# Patient Record
Sex: Male | Born: 1945
Health system: Southern US, Community
[De-identification: ages and names within clinical notes are randomized; demographics above are authoritative.]

## PROBLEM LIST (undated history)

## (undated) DIAGNOSIS — G4730 Sleep apnea, unspecified: Secondary | ICD-10-CM

## (undated) DIAGNOSIS — E785 Hyperlipidemia, unspecified: Secondary | ICD-10-CM

## (undated) DIAGNOSIS — E079 Disorder of thyroid, unspecified: Secondary | ICD-10-CM

## (undated) DIAGNOSIS — D689 Coagulation defect, unspecified: Secondary | ICD-10-CM

## (undated) DIAGNOSIS — N529 Male erectile dysfunction, unspecified: Secondary | ICD-10-CM

## (undated) DIAGNOSIS — J45909 Unspecified asthma, uncomplicated: Secondary | ICD-10-CM

## (undated) DIAGNOSIS — I10 Essential (primary) hypertension: Secondary | ICD-10-CM

## (undated) DIAGNOSIS — M1990 Unspecified osteoarthritis, unspecified site: Secondary | ICD-10-CM

## (undated) DIAGNOSIS — J329 Chronic sinusitis, unspecified: Secondary | ICD-10-CM

## (undated) DIAGNOSIS — J449 Chronic obstructive pulmonary disease, unspecified: Secondary | ICD-10-CM

## (undated) DIAGNOSIS — K219 Gastro-esophageal reflux disease without esophagitis: Secondary | ICD-10-CM

## (undated) DIAGNOSIS — K648 Other hemorrhoids: Secondary | ICD-10-CM

## (undated) DIAGNOSIS — D126 Benign neoplasm of colon, unspecified: Secondary | ICD-10-CM

## (undated) DIAGNOSIS — G4733 Obstructive sleep apnea (adult) (pediatric): Secondary | ICD-10-CM

## (undated) DIAGNOSIS — J309 Allergic rhinitis, unspecified: Secondary | ICD-10-CM

## (undated) DIAGNOSIS — C6290 Malignant neoplasm of unspecified testis, unspecified whether descended or undescended: Secondary | ICD-10-CM

## (undated) DIAGNOSIS — J9819 Other pulmonary collapse: Secondary | ICD-10-CM

## (undated) DIAGNOSIS — T7840XA Allergy, unspecified, initial encounter: Secondary | ICD-10-CM

## (undated) HISTORY — DX: Other pulmonary collapse: J98.19

## (undated) HISTORY — DX: Sleep apnea, unspecified: G47.30

## (undated) HISTORY — DX: Malignant neoplasm of unspecified testis, unspecified whether descended or undescended: C62.90

## (undated) HISTORY — DX: Chronic sinusitis, unspecified: J32.9

## (undated) HISTORY — DX: Allergy, unspecified, initial encounter: T78.40XA

## (undated) HISTORY — DX: Benign neoplasm of colon, unspecified: D12.6

## (undated) HISTORY — DX: Other hemorrhoids: K64.8

## (undated) HISTORY — DX: Essential (primary) hypertension: I10

## (undated) HISTORY — PX: TESTICLE REMOVAL: SHX68

## (undated) HISTORY — DX: Disorder of thyroid, unspecified: E07.9

## (undated) HISTORY — DX: Hyperlipidemia, unspecified: E78.5

## (undated) HISTORY — DX: Coagulation defect, unspecified: D68.9

## (undated) HISTORY — PX: NOSE SURGERY: SHX723

## (undated) HISTORY — DX: Chronic obstructive pulmonary disease, unspecified: J44.9

## (undated) HISTORY — DX: Unspecified osteoarthritis, unspecified site: M19.90

## (undated) HISTORY — PX: SINUS SURGERY WITH INSTATRAK: SHX5215

## (undated) HISTORY — DX: Unspecified asthma, uncomplicated: J45.909

## (undated) HISTORY — PX: INGUINAL HERNIA REPAIR: SUR1180

## (undated) HISTORY — DX: Obstructive sleep apnea (adult) (pediatric): G47.33

## (undated) HISTORY — PX: POLYPECTOMY: SHX149

## (undated) HISTORY — DX: Gastro-esophageal reflux disease without esophagitis: K21.9

## (undated) HISTORY — PX: COLONOSCOPY: SHX174

## (undated) HISTORY — DX: Allergic rhinitis, unspecified: J30.9

## (undated) HISTORY — DX: Male erectile dysfunction, unspecified: N52.9

---

## 2001-03-05 ENCOUNTER — Encounter (INDEPENDENT_AMBULATORY_CARE_PROVIDER_SITE_OTHER): Payer: Self-pay

## 2001-03-05 ENCOUNTER — Other Ambulatory Visit: Admission: RE | Admit: 2001-03-05 | Discharge: 2001-03-05 | Payer: Self-pay | Admitting: Internal Medicine

## 2001-03-05 ENCOUNTER — Encounter: Payer: Self-pay | Admitting: Internal Medicine

## 2002-06-18 ENCOUNTER — Encounter: Payer: Self-pay | Admitting: Internal Medicine

## 2002-08-06 ENCOUNTER — Encounter: Payer: Self-pay | Admitting: Internal Medicine

## 2002-12-18 ENCOUNTER — Ambulatory Visit (HOSPITAL_COMMUNITY): Admission: RE | Admit: 2002-12-18 | Discharge: 2002-12-19 | Payer: Self-pay | Admitting: Otolaryngology

## 2002-12-18 ENCOUNTER — Encounter: Payer: Self-pay | Admitting: Otolaryngology

## 2003-12-16 ENCOUNTER — Encounter: Admission: RE | Admit: 2003-12-16 | Discharge: 2003-12-16 | Payer: Self-pay | Admitting: Otolaryngology

## 2004-10-22 ENCOUNTER — Ambulatory Visit: Payer: Self-pay | Admitting: Internal Medicine

## 2004-11-05 ENCOUNTER — Ambulatory Visit: Payer: Self-pay | Admitting: Internal Medicine

## 2004-11-19 ENCOUNTER — Ambulatory Visit: Payer: Self-pay | Admitting: Surgery

## 2004-11-22 ENCOUNTER — Ambulatory Visit: Payer: Self-pay | Admitting: Otolaryngology

## 2005-04-26 ENCOUNTER — Other Ambulatory Visit: Payer: Self-pay

## 2005-04-26 ENCOUNTER — Emergency Department: Payer: Self-pay | Admitting: Internal Medicine

## 2005-06-22 ENCOUNTER — Ambulatory Visit: Payer: Self-pay | Admitting: Otolaryngology

## 2005-09-23 ENCOUNTER — Ambulatory Visit: Payer: Self-pay | Admitting: Internal Medicine

## 2005-09-27 ENCOUNTER — Encounter (INDEPENDENT_AMBULATORY_CARE_PROVIDER_SITE_OTHER): Payer: Self-pay | Admitting: *Deleted

## 2005-09-27 ENCOUNTER — Ambulatory Visit: Payer: Self-pay | Admitting: Internal Medicine

## 2007-03-09 ENCOUNTER — Ambulatory Visit: Payer: Self-pay | Admitting: Internal Medicine

## 2007-03-21 ENCOUNTER — Ambulatory Visit: Payer: Self-pay | Admitting: Internal Medicine

## 2007-03-21 ENCOUNTER — Encounter: Payer: Self-pay | Admitting: Internal Medicine

## 2007-10-20 ENCOUNTER — Other Ambulatory Visit: Payer: Self-pay

## 2007-10-20 ENCOUNTER — Emergency Department: Payer: Self-pay | Admitting: Unknown Physician Specialty

## 2007-11-01 HISTORY — PX: SUBCLAVIAN BYPASS GRAFT: SHX1059

## 2008-05-30 ENCOUNTER — Ambulatory Visit: Payer: Self-pay | Admitting: Chiropractic Medicine

## 2008-11-11 ENCOUNTER — Ambulatory Visit: Payer: Self-pay | Admitting: Family Medicine

## 2009-02-25 ENCOUNTER — Encounter (INDEPENDENT_AMBULATORY_CARE_PROVIDER_SITE_OTHER): Payer: Self-pay | Admitting: *Deleted

## 2009-03-17 ENCOUNTER — Ambulatory Visit: Payer: Self-pay | Admitting: Internal Medicine

## 2009-04-02 ENCOUNTER — Ambulatory Visit: Payer: Self-pay | Admitting: Internal Medicine

## 2009-04-02 ENCOUNTER — Encounter: Payer: Self-pay | Admitting: Internal Medicine

## 2009-04-06 ENCOUNTER — Encounter: Payer: Self-pay | Admitting: Internal Medicine

## 2009-04-09 ENCOUNTER — Encounter: Payer: Self-pay | Admitting: Internal Medicine

## 2009-04-23 ENCOUNTER — Telehealth (INDEPENDENT_AMBULATORY_CARE_PROVIDER_SITE_OTHER): Payer: Self-pay

## 2009-05-13 ENCOUNTER — Ambulatory Visit: Payer: Self-pay | Admitting: Genetic Counselor

## 2009-09-03 ENCOUNTER — Ambulatory Visit: Payer: Self-pay | Admitting: Genetic Counselor

## 2010-02-17 ENCOUNTER — Telehealth: Payer: Self-pay | Admitting: Internal Medicine

## 2010-04-02 ENCOUNTER — Encounter (INDEPENDENT_AMBULATORY_CARE_PROVIDER_SITE_OTHER): Payer: Self-pay | Admitting: *Deleted

## 2010-04-12 ENCOUNTER — Ambulatory Visit: Payer: Self-pay | Admitting: Genetic Counselor

## 2010-04-15 ENCOUNTER — Encounter: Payer: Self-pay | Admitting: Internal Medicine

## 2010-05-13 ENCOUNTER — Encounter (INDEPENDENT_AMBULATORY_CARE_PROVIDER_SITE_OTHER): Payer: Self-pay | Admitting: *Deleted

## 2010-05-15 ENCOUNTER — Encounter: Payer: Self-pay | Admitting: Internal Medicine

## 2010-05-17 ENCOUNTER — Ambulatory Visit: Payer: Self-pay | Admitting: Internal Medicine

## 2010-06-01 ENCOUNTER — Ambulatory Visit: Payer: Self-pay | Admitting: Internal Medicine

## 2010-06-02 ENCOUNTER — Encounter: Payer: Self-pay | Admitting: Internal Medicine

## 2010-11-30 NOTE — Letter (Signed)
Summary: Patient Notice- Polyp Results  [facility]  [address] [place], [place] [address]   Phone: [phone]  Fax: [phone]        June 02, 2010 MRN: [id number]    Matthew [name] [address] [place], [place]  [address]    Dear [name],  I am pleased to inform you that the colon polyp(s) removed during your recent colonoscopy was (were) found to be benign (no cancer detected) upon pathologic examination.  I recommend you have a repeat colonoscopy examination in one year to look for recurrent polyps, as having colon polyps increases your risk for having recurrent polyps or even colon cancer in the future.  Should you develop new or worsening symptoms of abdominal pain, bowel habit changes or bleeding from the rectum or bowels, please schedule an evaluation with either your primary care physician or with me.  Additional information/recommendations:  __ No further action with gastroenterology is needed at this time. Please      follow-up with your primary care physician for your other healthcare      needs.  __ Please consider rescheduling an appointment with the genetic counselor at the cancer center.   Please call us if you are having persistent problems or have questions about your condition that have not been fully answered at this time.  Sincerely,  Hilarie Fredrickson MD  This letter has been electronically signed by your physician.  Appended Document: Patient Notice- Polyp Results letter mailed

## 2010-11-30 NOTE — Procedures (Signed)
Summary: Colonoscopy  Patient: Matthew [name] Note: All result statuses are Final unless otherwise noted.  Tests: (1) Colonoscopy (COL)   COL Colonoscopy           DONE     [facility]     [address]     [place], [place]  [address]           COLONOSCOPY PROCEDURE REPORT           PATIENT:  Matthew [name], Matthew [name]  MR#:  [id number]     BIRTHDATE:  1945/11/11, 64 yrs. old  GENDER:  male     ENDOSCOPIST:  Wilhemina Bonito. Eda Keys, MD     REF. BY:  Surveillance Program Recall     PROCEDURE DATE:  06/01/2010     PROCEDURE:  Colonoscopy with snare polypectomy x 3     ASA CLASS:  Class II     INDICATIONS:  history of pre-cancerous (adenomatous) colon polyps,     surveillance and high-risk screening, family history of colon     cancer ;2002 w/ TVA;2003; 10-2004 w/ malignant     polyp;11-06;2008;2010 w/ 9 small polyps; Also, sister w [name]     Cancer     MEDICATIONS:   Fentanyl 75 mcg IV, Versed 9 mg IV           DESCRIPTION OF PROCEDURE:   After the risks benefits and     alternatives of the procedure were thoroughly explained, informed     consent was obtained.  Digital rectal exam was performed and     revealed no abnormalities.   The LB CF-H180AL [id number] endoscope     was introduced through the anus and advanced to the cecum, which     was identified by both the appendix and ileocecal valve, without     limitations.Time to cecum = 1:35 min.  The quality of the prep was     excellent, using MoviPrep.  The instrument was then slowly     withdrawn (time = 14:49min) as the colon was fully examined.     <<PROCEDUREIMAGES>>           FINDINGS:  Three polyps , all <25mm, were found in the ascending     colon (2) and transverse colon. Polyps were snared without     cautery. Retrieval was successful in 2/3.  Moderate diverticulosis     was found found scattered throught the colon.  This was otherwise     a normal examination of the colon.   Retroflexed views in the     rectum revealed  internal hemorrhoids.    The scope was then     withdrawn from the patient and the procedure completed.           COMPLICATIONS:  None     ENDOSCOPIC IMPRESSION:     1) Three polyps - removed     2) Moderate diverticulosis found scattered throught the colon     3) Otherwise normal examination     4) Internal hemorrhoids           RECOMMENDATIONS:     1) Follow up colonoscopy in one year     2) Please reconsider evaluation with Database administrator at     [facility] center           ______________________________     Wilhemina Bonito. Eda Keys, MD           CC:  Julieanne Manson, MD; The Patient  n.     eSIGNED:   Wilhemina Bonito. Eda Keys at 06/01/2010 11:05 AM           Mont Dutton, [id number]  Note: An exclamation mark (!) indicates a result that was not dispersed into the flowsheet. Document Creation Date: 06/01/2010 11:06 AM _______________________________________________________________________  (1) Order result status: Final Collection or observation date-time: 06/01/2010 10:55 Requested date-time:  Receipt date-time:  Reported date-time:  Referring Physician:   Ordering Physician: Fransico Setters [id number]) Specimen Source:  Source: Launa Grill Order Number: [id number] Lab site:   Appended Document: Colonoscopy     Procedures Next Due Date:    Colonoscopy: 06/2011

## 2010-11-30 NOTE — Progress Notes (Signed)
Summary: Referral to Genetics   Phone Note Outgoing Call   Call placed by: Milford Cage NCMA,  February 17, 2010 10:32 AM Call placed to: Patient Summary of Call: Called patient to follow-up on his appt. with [facility].  He was suppose to schedule in September of 2010 and didnt.  He states that he is traveling out of country alot and it is difficult for him to make the appt.  He states he will do so as soon as he can.   Initial call taken by: Milford Cage NCMA,  February 17, 2010 10:33 AM

## 2010-11-30 NOTE — Letter (Signed)
Summary: Colonoscopy Letter  [facility]  [address] [place], [place] [address]   Phone: [phone]  Fax: [phone]      April 02, 2010 MRN: [id number]   Matthew [name] [address] [place], [place]  [address]   Dear [name],   According to your medical record, it is time for you to schedule a Colonoscopy. The American Cancer Society recommends this procedure as a method to detect early colon cancer. Patients with a family history of colon cancer, or a personal history of colon polyps or inflammatory bowel disease are at increased risk.  This letter has been generated based on the recommendations made at the time of your procedure. If you feel that in your particular situation this may no longer apply, please contact our office.  Please call our office at [phone] to schedule this appointment or to update your records at your earliest convenience.  Thank you for cooperating with [place] to provide you with the very best care possible.   Sincerely,  Wilhemina Bonito. Marina Goodell, M.D.  [facility] [phone]

## 2010-11-30 NOTE — Letter (Signed)
[facility]   Imported By: Sherian Rein 04/22/2010 08:59:18  _____________________________________________________________________  External Attachment:    Type:   Image     Comment:   External Document

## 2010-11-30 NOTE — Letter (Signed)
Summary: Recall Colonoscopy Letter  [facility]  [address] [place], [place] [address]   Phone: [phone]  Fax: [phone]      February 25, 2009 MRN: [id number]   Matthew [name] [address] [place], [place]  [address]   Dear [name],   According to your medical record, it is time for you to schedule a Colonoscopy. The American Cancer Society recommends this procedure as a method to detect early colon cancer. Patients with a family history of colon cancer, or a personal history of colon polyps or inflammatory bowel disease are at increased risk.  This letter has beeen generated based on the recommendations made at the time of your procedure. If you feel that in your particular situation this may no longer apply, please contact our office.  Please call our office at [phone] to schedule this appointment or to update your records at your earliest convenience.  Thank you for cooperating with [place] to provide you with the very best care possible.   Sincerely,  Wilhemina Bonito. Marina Goodell, M.D.  [facility] [phone]

## 2010-11-30 NOTE — Miscellaneous (Signed)
Summary: [facility] previsit  Clinical Lists Changes  Medications: Added new medication of MOVIPREP 100 GM  SOLR (PEG-KCL-NACL-NASULF-NA ASC-C) As per prep instructions. - Signed Rx of MOVIPREP 100 GM  SOLR (PEG-KCL-NACL-NASULF-NA ASC-C) As per prep instructions.;  #1 x 0;  Signed;  Entered by: [name];  Authorized by: Hilarie Fredrickson MD;  Method used: [id number] [address], [place], [place]  [address], Ph: [phone], Fax: [phone] Observations: Added new observation of NKA: T (05/17/2010 7:48)    Prescriptions: MOVIPREP 100 GM  SOLR (PEG-KCL-NACL-NASULF-NA ASC-C) As per prep instructions.  #1 x 0   Entered by:   [name]   Authorized by:   Hilarie Fredrickson MD   Signed by:   [name] on 05/17/2010   Method used:   [facility]ically to        [id number]       [address]       [place], [place]  [address]       Ph: [phone]       Fax: [phone]   RxID:   [id number]

## 2010-11-30 NOTE — Letter (Signed)
Summary: [facility] Instructions  [facility]  [address] [place], [place] [address]   Phone: [phone]  Fax: [phone]       Matthew [name]    May 15, 1946    MRN: [id number]        Procedure Day /Date:  06/01/10  Tuesday     Arrival Time:  8:30am     Procedure Time: 9:30am     Location of Procedure:                    _x _  [facility] (4th Floor)                        PREPARATION FOR COLONOSCOPY WITH MOVIPREP   Starting 5 days prior to your procedure _ 7/28/11_ do not eat nuts, seeds, popcorn, corn, beans, peas,  salads, or any raw vegetables.  Do not take any fiber supplements (e.g. Metamucil, Citrucel, and Benefiber).  THE DAY BEFORE YOUR PROCEDURE         DATE:    06/06/10  DAY:   Monday  1.  Drink clear liquids the entire day-NO SOLID FOOD  2.  Do not drink anything colored red or purple.  Avoid juices with pulp.  No orange juice.  3.  Drink at least 64 oz. (8 glasses) of fluid/clear liquids during the day to prevent dehydration and help the prep work efficiently.  CLEAR LIQUIDS INCLUDE: Water Jello Ice Popsicles Tea (sugar ok, no milk/cream) Powdered fruit flavored drinks Coffee (sugar ok, no milk/cream) Gatorade Juice: apple, white grape, white cranberry  Lemonade Clear bullion, consomm, broth Carbonated beverages (any kind) Strained chicken noodle soup Hard Candy                             4.  In the morning, mix first dose of MoviPrep solution:    Empty 1 Pouch A and 1 Pouch B into the disposable container    Add lukewarm drinking water to the top line of the container. Mix to dissolve    Refrigerate (mixed solution should be used within 24 hrs)  5.  Begin drinking the prep at 5:00 p.m. The MoviPrep container is divided by 4 marks.   Every 15 minutes drink the solution down to the next mark (approximately 8 oz) until the full liter is complete.   6.  Follow completed prep with 16 oz of clear liquid of your choice  (Nothing red or purple).  Continue to drink clear liquids until bedtime.  7.  Before going to bed, mix second dose of MoviPrep solution:    Empty 1 Pouch A and 1 Pouch B into the disposable container    Add lukewarm drinking water to the top line of the container. Mix to dissolve    Refrigerate  THE DAY OF YOUR PROCEDURE      DATE:   06/01/10 DAY:  Tuesday  Beginning at 4:30 a.m. (5 hours before procedure):         1. Every 15 minutes, drink the solution down to the next mark (approx 8 oz) until the full liter is complete.  2. Follow completed prep with 16 oz. of clear liquid of your choice.    3. You may drink clear liquids until  7:30am  (2 HOURS BEFORE PROCEDURE).   MEDICATION INSTRUCTIONS  Unless otherwise instructed, you should take regular prescription medications with a small sip  of water   as early as possible the morning of your procedure.         OTHER INSTRUCTIONS  You will need a responsible adult at least 65 years of age to accompany you and drive you home.   This person must remain in the waiting room during your procedure.  Wear loose fitting clothing that is easily removed.  Leave jewelry and other valuables at home.  However, you may wish to bring a book to read or  an iPod/MP3 player to listen to music as you wait for your procedure to start.  Remove all body piercing jewelry and leave at home.  Total time from sign-in until discharge is approximately 2-3 hours.  You should go home directly after your procedure and rest.  You can resume normal activities the  day after your procedure.  The day of your procedure you should not:   Drive   Make legal decisions   Operate machinery   Drink alcohol   Return to work  You will receive specific instructions about eating, activities and medications before you leave.    The above instructions have been reviewed and explained to me by  [name]  May 17, 2010 8:14 AM    I fully  understand and can verbalize these instructions _____________________________ Date _________

## 2010-11-30 NOTE — Miscellaneous (Signed)
Summary: [facility] PV  Clinical Lists Changes  Medications: Added new medication of MOVIPREP 100 GM  SOLR (PEG-KCL-NACL-NASULF-NA ASC-C) As per prep instructions. - Signed Rx of MOVIPREP 100 GM  SOLR (PEG-KCL-NACL-NASULF-NA ASC-C) As per prep instructions.;  #1 x 0;  Signed;  Entered by: Ezra Sites RN;  Authorized by: Hilarie Fredrickson MD;  Method used: [id number] [address], [place], [place]  [address], Ph: [phone], Fax: [phone] Observations: Added new observation of NKA: T (03/17/2009 9:17)    Prescriptions: MOVIPREP 100 GM  SOLR (PEG-KCL-NACL-NASULF-NA ASC-C) As per prep instructions.  #1 x 0   Entered by:   Ezra Sites RN   Authorized by:   Hilarie Fredrickson MD   Signed by:   Ezra Sites RN on 03/17/2009   Method used:   Electronically to        [id number]       [address]       [place], [place]  [address]       Ph: [phone]       Fax: [phone]   RxID:   [id number]

## 2010-11-30 NOTE — Letter (Signed)
Summary: [facility]  [facility]   Imported By: Sherian Rein 06/04/2010 10:50:37  _____________________________________________________________________  External Attachment:    Type:   Image     Comment:   External Document

## 2010-11-30 NOTE — Procedures (Signed)
Summary: Colonoscopy   Colonoscopy  Procedure date:  04/02/2009  Findings:      Location:  [facility].    Procedures Next Due Date:    Colonoscopy: 03/2010  COLONOSCOPY PROCEDURE REPORT  PATIENT:  Matthew [name], Matthew [name]  MR#:  [id number] BIRTHDATE:   Jan 23, 1946, 63 yrs. old   GENDER:   male  ENDOSCOPIST:   Matthew Bonito. Eda Keys, MD Referred by: Matthew [name],  PROCEDURE DATE:  04/02/2009 PROCEDURE:  Colonoscopy with snare polypectomy ASA CLASS:   Class II INDICATIONS: history of adenomatous colon polyps (2002 - TVadenoma; 2003, 10-2004 w/ malignant polyp; 08-2005; 2008)  MEDICATIONS:    Fentanyl 50 mcg IV, Versed 8 mg IV  DESCRIPTION OF PROCEDURE:   After the risks benefits and alternatives of the procedure were thoroughly explained, informed consent was obtained.  Digital rectal exam was performed and revealed no abnormalities.   The LB CF-H180AL [id number] endoscope was introduced through the anus and advanced to the cecum, which was identified by both the appendix and ileocecal valve, without limitations. TIME TO CECUM = 1:21 MIN The quality of the prep was excellent, using MoviPrep.  The instrument was then withdrawn (TIME = 22:20 MIN) as the colon was fully examined. <<PROCEDUREIMAGES>>                    <<OLD IMAGES>>  FINDINGS:  There were multiple polyps (9) identified and removed. Cecal 3mm, 5mm; ascending 2,3,4,4,91mm; prox. transverse 65mm,5mm. Polyps were snared without cautery. Retrieval of 7 was successful.  Mild diverticulosis was found in the left colon.   Retroflexed views in the rectum revealed no abnormalities.    The scope was then withdrawn from the patient and the procedure completed.  COMPLICATIONS:   None  ENDOSCOPIC IMPRESSION:  1) Polyps, multiple - 9SEEN AND REMOVED  2) Mild diverticulosis in the left colon 3) ? Attenuated FAP or MAP  RECOMMENDATIONS:  1) Follow up colonoscopy in 1 year     _______________________________ Matthew Bonito. Eda Keys, MD   CC: Matthew Manson, MD; The Patient      REPORT OF SURGICAL PATHOLOGY   Case #: [id number] Patient Name: Matthew [name], Matthew [name] Office Chart Number:  [id number]   MRN: [id number] Pathologist: Matthew Booty B. Colonel Bald, MD DOB/Age  [birth year] (Age: 42)    Gender: M Date Taken:  04/02/2009 Date Received: 04/03/2009   FINAL DIAGNOSIS   ***MICROSCOPIC EXAMINATION AND DIAGNOSIS***   COLON, CECUM, ASCENDING, AND TRANSVERSE, POLYPS:  - MULTIPLE FRAGMENTS OF TUBULAR ADENOMA(S).  - MULTIPLE FRAGMENTS OF HYPERPLASTIC POLYP(S).  - HIGH GRADE DYSPLASIA IS NOT IDENTIFIED.    mj Date Reported:  04/06/2009     Matthew Booty B. Colonel Bald, MD *** Electronically Signed Out By [place] ***    April 06, 2009 MRN: [id number]    Matthew [name] [address] [place], [place]  [address]    Dear [name],  I am pleased to inform you that the colon polyp(s) removed during your recent colonoscopy was (were) found to be benign (no cancer detected) upon pathologic examination.  I recommend you have a repeat colonoscopy examination in ONE years to look for recurrent polyps, as having colon polyps increases your risk for having recurrent polyps or even colon cancer in the future.  Should you develop new or worsening symptoms of abdominal pain, bowel habit changes or bleeding from the rectum or bowels, please schedule an evaluation with either your primary care physician or with me.  Additional information/recommendations:  __ No further action with  gastroenterology is needed at this time. Please      follow-up with your primary care physician for your other healthcare      needs.  __ Please call [phone] to schedule an appointment with the genetic specialist at the cancer center (if not already done).    Please call us if you are having persistent problems or have questions about your condition that have not been fully answered at this time.  Sincerely,  Matthew Fredrickson MD  This letter has been electronically signed  by your physician.   Signed by Matthew Fredrickson MD on 04/06/2009 at 3:06 PM   This report was created from the original endoscopy report, which was reviewed and signed by the above listed endoscopist.

## 2010-11-30 NOTE — Miscellaneous (Signed)
Summary: Referral to Genetic Clinic  Clinical Lists Changes  Orders: Added new Test order of Genetic Counselor Annia Friendly ([facility]) - Signed

## 2010-11-30 NOTE — Letter (Signed)
Summary: Patient Notice- Polyp Results  [facility]  [address] [place], [place] [address]   Phone: [phone]  Fax: [phone]        April 06, 2009 MRN: [id number]    NELSON NOONE [address] [place], [place]  [address]    Dear Mr. Pavlov,  I am pleased to inform you that the colon polyp(s) removed during your recent colonoscopy was (were) found to be benign (no cancer detected) upon pathologic examination.  I recommend you have a repeat colonoscopy examination in ONE years to look for recurrent polyps, as having colon polyps increases your risk for having recurrent polyps or even colon cancer in the future.  Should you develop new or worsening symptoms of abdominal pain, bowel habit changes or bleeding from the rectum or bowels, please schedule an evaluation with either your primary care physician or with me.  Additional information/recommendations:  __ No further action with gastroenterology is needed at this time. Please      follow-up with your primary care physician for your other healthcare      needs.  __ Please call [phone] to schedule an appointment with the genetic specialist at the cancer center (if not already done).    Please call us if you are having persistent problems or have questions about your condition that have not been fully answered at this time.  Sincerely,  Hilarie Fredrickson MD  This letter has been electronically signed by your physician.

## 2010-11-30 NOTE — Progress Notes (Signed)
Summary: Appt with Genetic Clinic   Phone Note Outgoing Call   Call placed by: Ulis Rias RN Call placed to: Specialist Summary of Call: [facility] to check on appt date and time with Dr Annia Friendly. Left message to call office. Initial call taken by: Ulis Rias RN,  April 23, 2009 3:37 PM      Appended Document: Appt with Genetic Clinic Called Genectic counsel at [phone] and spoke with Bradly Chris to check on the status of appt for pt with Dr Annia Friendly. Per Bradly Chris, she does have the faxed records on pt and will try to schedule an appt as soon as she can. She will call us back with the appt later.  Appended Document: Appt with Genetic Clinic Called Genetic Clinic at [facility] and left message for Renee to call me back with update on appt. date and time.  Will attempt to call back if I haven't heard anything by end of day.  BS  Appended Document: Appt with Genetic Clinic renee called and states patient is out of state for 30 days and will schedule appt. in September.    Appended Document: Appt with Genetic Clinic called patient  regarding Genetics Referral.  Left message on VM asking if he is planning on making that appt. with [facility].  I asked him to call me and let me know.

## 2010-12-27 ENCOUNTER — Ambulatory Visit: Payer: Self-pay | Admitting: Internal Medicine

## 2010-12-28 NOTE — Procedures (Signed)
Summary: colonoscopy   Colonoscopy  Procedure date:  03/05/2001  Findings:      Location:  [facility].  Results: Polyp.  tubular adenoma  Patient Name: Matthew [name], Matthew [name] MRN:  Procedure Procedures: Colonoscopy CPT: 19147.    with polypectomy. CPT: A3573898.  Personnel: Endoscopist: Wilhemina Bonito. Marina Goodell, MD.  Referred By: Julieanne Manson, MD.  Exam Location: Exam performed in Outpatient Clinic. Outpatient  Patient Consent: Procedure, Alternatives, Risks and Benefits discussed, consent obtained, from patient.  Indications  Evaluation of: Polyps seen on recent Flexible Sigmoidoscopy.  History  Pre-Exam Physical: Performed Mar 05, 2001. Cardio-pulmonary exam, Rectal exam, HEENT exam , Abdominal exam, Extremity exam, Neurological exam, Mental status exam WNL.  Exam Exam: Extent of exam reached: Cecum, extent intended: Cecum.  The cecum was identified by appendiceal orifice and IC valve. Patient position: on left side. Colon retroflexion performed. Images taken. ASA Classification: I. Tolerance: excellent.  Monitoring: Pulse and BP monitoring, Oximetry used. Supplemental O2 given.  Colon Prep Used Golytely for colon prep. Prep results: excellent.  Sedation Meds: Versed 7 mg. Fentanyl 100 mcg.  Findings POLYP: Transverse Colon, Maximum size: 10 mm. pedunculated polyp. Procedure:  snare with cautery, removed, retrieved, Polyp sent to pathology.  MULTIPLE POLYPS: Transverse Colon. minimum size 3 mm, maximum size 4 mm. Procedure:  snare with cautery, removed, retrieved, 2 polyps Polyps sent to pathology.  MULTIPLE POLYPS: Cecum. minimum size 2 mm, maximum size 6 mm. Procedure:  snare with cautery, removed, Polyp retrieved, 3 polyps Polyps sent to pathology. ICD9: Colon Polyps: 211.3.  - DIVERTICULOSIS: Descending Colon to Sigmoid Colon. ICD9: Diverticulosis, Colon: 562.10.  POLYP: Sigmoid Colon, Maximum size: 16 mm. pedunculated polyp. Distance from Anus 32 cm.  Procedure:  snare with cautery, removed, retrieved, Polyp sent to pathology.  POLYP: Sigmoid Colon, Maximum size: 15 mm. pedunculated polyp. Distance from Anus 30 cm. Procedure:  snare with cautery, removed, retrieved, sent to pathology.  HEMORRHOIDS: Internal. ICD9: Hemorrhoids, Internal: 455.0.   Assessment Abnormal examination, see findings above.  Diagnoses: 211.3: Colon Polyps.  562.10: Diverticulosis, Colon.  455.0: Hemorrhoids, Internal.   Events  Unplanned Interventions: No intervention was required.  Unplanned Events: There were no complications. Plans  Post Exam Instructions: No aspirin or non-steroidal containing medications: 2 weeks.  Patient Education: Patient given standard instructions for: Polyps.  Disposition: After procedure patient sent to recovery. After recovery patient sent home.  Scheduling/Referral: Colonoscopy, to Wilhemina Bonito. Marina Goodell, MD, in 1 year given the numberof polyps.,    This report was created from the original endoscopy report, which was reviewed and signed by the above listed endoscopist.   cc:  Julieanne Manson, MD

## 2010-12-28 NOTE — Consult Note (Signed)
Summary: [facility]   Imported By: Sherian Rein 12/24/2010 06:51:14  _____________________________________________________________________  External Attachment:    Type:   Image     Comment:   External Document

## 2010-12-28 NOTE — Procedures (Signed)
Summary: Colonoscopy   Colonoscopy  Procedure date:  11/05/2004  Findings:      Results: Diverticulosis.         Location:  [facility].  Pathology:  Adenomatous polyp.        Adenocarcinoma  Comments:      Repeat colonoscopy in 2 years.    Procedures Next Due Date:    Colonoscopy: 10/2006  Colonoscopy  Procedure date:  11/05/2004  Findings:      Results: Diverticulosis.         Location:  [facility].  Pathology:  Adenomatous polyp.        Adenocarcinoma  Comments:      Repeat colonoscopy in 2 years.    Procedures Next Due Date:    Colonoscopy: 10/2006 Patient Name: Matthew [name], Matthew [name] MRN:  Procedure Procedures: Colonoscopy CPT: 45409.    with polypectomy. CPT: A3573898.  Personnel: Endoscopist: Wilhemina Bonito. Marina Goodell, MD.  Exam Location: Exam performed in Outpatient Clinic. Outpatient  Patient Consent: Procedure, Alternatives, Risks and Benefits discussed, consent obtained, from patient. Consent was obtained by the RN.  Indications  Surveillance of: Adenomatous Polyp(s). This is not an initial surveillance exam. Initial polypectomy was performed in 2002. in May. 3 or more Polyps were found at Index Exam. Largest polyp removed was 10 to 19 mm. Prior polyp located in both proximal and distal colon. Pathology of worst  polyp: tubulovillous adenoma. Previous surveillance exam(s) in  2003,  History  Current Medications: Patient is not currently taking Coumadin.  Pre-Exam Physical: Performed Nov 05, 2004. Entire physical exam was normal.  Exam Exam: Extent of exam reached: Cecum, extent intended: Cecum.  The cecum was identified by appendiceal orifice and IC valve. Patient position: on left side. Colon retroflexion performed. Images taken. ASA Classification: II. Tolerance: excellent.  Monitoring: Pulse and BP monitoring, Oximetry used. Supplemental O2 given.  Colon Prep Used MIRALAX for colon prep. Prep results: excellent.  Sedation  Meds: Patient assessed and found to be appropriate for moderate (conscious) sedation. Fentanyl 75 mcg. given IV. Versed 8 mg. given IV.  Findings NORMAL EXAM: Cecum to Rectum.  MULTIPLE POLYPS: Ascending Colon to Hepatic Flexure. minimum size 4 mm, maximum size 12 mm. Procedure:  snare with cautery, removed, Polyp retrieved, 4 polyps Polyps sent to pathology. ICD9: Colon Polyps: 211.3. Comments: 4mm & 5mm (cold snare) 10mm & 12mm (cautery) .  - DIVERTICULOSIS: Sigmoid Colon. ICD9: Diverticulosis, Colon: 562.10.   Assessment Abnormal examination, see findings above.  Diagnoses: 211.3: Colon Polyps.  562.10: Diverticulosis, Colon.   Events  Unplanned Interventions: No intervention was required.  Unplanned Events: There were no complications. Plans  Post Exam Instructions: No aspirin or non-steroidal containing medications: 2 weeks.  Disposition: After procedure patient sent to recovery. After recovery patient sent home.  Scheduling/Referral: Colonoscopy, to Wilhemina Bonito. Marina Goodell, MD, in 2 years ,    This report was created from the original endoscopy report, which was reviewed and signed by the above listed endoscopist.   cc:  Julieanne Manson, MD      The Patient

## 2010-12-28 NOTE — Procedures (Signed)
Summary: Colonoscopy   Colonoscopy  Procedure date:  09/27/2005  Findings:      Location:  [facility].  Results: Diverticulosis.       Results: Polyp.    Procedures Next Due Date:    Colonoscopy: 09/2006  Colonoscopy  Procedure date:  09/27/2005  Findings:      [facility]  Results: Diverticulosis.       Results: Polyp.    Procedures Next Due Date:    Colonoscopy: 09/2006 Patient Name: Dona, Klemann MRN:  Procedure Procedures: Colonoscopy CPT: 16109.    with polypectomy. CPT: A3573898.  Personnel: Endoscopist: Wilhemina Bonito. Marina Goodell, MD.  Exam Location: Exam performed in Outpatient Clinic. Outpatient  Patient Consent: Procedure, Alternatives, Risks and Benefits discussed, consent obtained, from patient. Consent was obtained by the RN.  Indications  Surveillance of: Adenomatous Polyp(s). This is not an initial surveillance exam. Initial polypectomy was performed in 2002. in Mar. 3 or more Polyps were found at Index Exam. Largest polyp removed was 10 to 19 mm. Prior polyp located in both proximal and distal colon. Pathology of worst  polyp: tubulovillous adenoma. Previous surveillance exam(s) in  2003, Previous surveillance exam(s) in  2006,  Comments: NOTE: Small polyp with malignant focus resected on 10-2004 exam History  Current Medications: Patient is not currently taking Coumadin.  Comments: P.VASC. SURGERY SINCE LAST EXAM Pre-Exam Physical: Performed Sep 27, 2005. Cardio-pulmonary exam, Rectal exam, Abdominal exam, Mental status exam WNL.  Exam Exam: Extent of exam reached: Cecum, extent intended: Cecum.  The cecum was identified by appendiceal orifice and IC valve. Patient position: on left side. Colon retroflexion performed. Images taken. ASA Classification: II. Tolerance: excellent.  Monitoring: Pulse and BP monitoring, Oximetry used. Supplemental O2 given.  Colon Prep Used MIRALAX for colon prep. Prep results:  excellent.  Sedation Meds: Patient assessed and found to be appropriate for moderate (conscious) sedation. Fentanyl 50 mcg. given IV. Versed 9 mg. given IV.  Findings POLYP: Hepatic Flexure, Maximum size: 3 mm. sessile polyp. Procedure:  snare with cautery, The polyp was removed piece meal. removed, retrieved, Polyp sent to pathology. ICD9: Colon Polyps: 211.3.  NORMAL EXAM: Cecum to Rectum. Comments: NO evidence of residual malignant polyp.  - DIVERTICULOSIS: Sigmoid Colon. ICD9: Diverticulosis, Colon: 562.10.   Assessment Abnormal examination, see findings above.  Diagnoses: 211.3: Colon Polyps.  562.10: Diverticulosis, Colon.   Events  Unplanned Interventions: No intervention was required.  Unplanned Events: There were no complications. Plans Disposition: After procedure patient sent to recovery. After recovery patient sent home.  Scheduling/Referral: Colonoscopy, to Wilhemina Bonito. Marina Goodell, MD, IN 1 YEAR,    This report was created from the original endoscopy report, which was reviewed and signed by the above listed endoscopist.   cc:  Julieanne Manson, MD      The Patient

## 2010-12-28 NOTE — Procedures (Signed)
Summary: colonoscopy   Colonoscopy  Procedure date:  08/06/2002  Findings:      Location:  [facility].  Results: Hemorrhoids.     Results: Diverticulosis.       Pathology:  Adenomatous polyp.          Procedures Next Due Date:    Colonoscopy: 07/2004 Patient Name: Matthew [name], Gagen MRN:  Procedure Procedures: Colonoscopy CPT: 16109.    with polypectomy. CPT: A3573898.  Personnel: Endoscopist: Wilhemina Bonito. Marina Goodell, MD.  Exam Location: Exam performed in Outpatient Clinic. Outpatient  Patient Consent: Procedure, Alternatives, Risks and Benefits discussed, consent obtained, from patient. Consent was obtained by the RN.  Indications  Surveillance of: Adenomatous Polyp(s). This is an initial surveillance exam. Initial polypectomy was performed in 2002. in May. 3 or more Polyps were found at Index Exam. Largest polyp removed was 10 to 19 mm. Prior polyp located in both proximal and distal colon. Pathology of worst  polyp: tubulovillous adenoma.  History  Pre-Exam Physical: Performed Aug 06, 2002. Entire physical exam was normal.  Exam Exam: Extent of exam reached: Cecum, extent intended: Cecum.  The cecum was identified by appendiceal orifice and IC valve. Patient position: on left side. Colon retroflexion performed. Images taken. ASA Classification: I. Tolerance: excellent.  Monitoring: Pulse and BP monitoring, Oximetry used. Supplemental O2 given.  Colon Prep Used Golytely for colon prep. Prep results: excellent.  Sedation Meds: Patient assessed and found to be appropriate for moderate (conscious) sedation. Fentanyl 100 mcg. given IV. Versed 7 mg. given IV.  Findings POLYP: Transverse Colon, diminutive, sessile polyp. Procedure:  snare with cautery, removed,  MULTIPLE POLYPS: Ascending Colon. minimum size 5 mm, maximum size 5 mm. Procedure:  snare with cautery, removed, Polyp retrieved, 2 polyps Polyps sent to pathology. ICD9: Colon Polyps: 211.3.  NORMAL EXAM:  Cecum to Rectum.  - DIVERTICULOSIS: Sigmoid Colon. ICD9: Diverticulosis, Colon: 562.10.  HEMORRHOIDS: Internal. ICD9: Hemorrhoids, Internal: 455.0.   Assessment Abnormal examination, see findings above.  Diagnoses: 455.0: Hemorrhoids, Internal.  562.10: Diverticulosis, Colon.  211.3: Colon Polyps.   Events  Unplanned Interventions: No intervention was required.  Unplanned Events: There were no complications. Plans Patient Education: Patient given standard instructions for: Polyps.  Disposition: After procedure patient sent to recovery. After recovery patient sent home.  Scheduling/Referral: Colonoscopy, to Wilhemina Bonito. Marina Goodell, MD, in 2 years.,    This report was created from the original endoscopy report, which was reviewed and signed by the above listed endoscopist.    cc:  Julieanne Manson, MD

## 2010-12-28 NOTE — Procedures (Signed)
Summary: Colonoscopy   Colonoscopy  Procedure date:  03/21/2007  Findings:      Location:  [facility].  Results: Diverticulosis.       Pathology:  Hyperplastic polyp. Tubular Adenoma      Procedures Next Due Date:    Colonoscopy: 03/2009 Patient Name: Matthew [name], Matthew [name] MRN:  Procedure Procedures: Colonoscopy CPT: 16109.    with polypectomy. CPT: A3573898.  Personnel: Endoscopist: Wilhemina Bonito. Marina Goodell, MD.  Exam Location: Exam performed in Outpatient Clinic. Outpatient  Patient Consent: Procedure, Alternatives, Risks and Benefits discussed, consent obtained, from patient. Consent was obtained by the RN.  Indications  Surveillance of: Adenomatous Polyp(s). This is not an initial surveillance exam. Initial polypectomy was performed in 2002. Pathology of worst  polyp: tubulovillous adenoma. Previous surveillance exam(s) in  2003, Previous surveillance exam(s) in  2006, Previous surveillance exam(s) in  2006,  Comments: NOTE: small polyp with malignant focus removed 10-2004 History  Current Medications: Patient is not currently taking Coumadin.  Pre-Exam Physical: Performed Mar 21, 2007. Cardio-pulmonary exam, Rectal exam, HEENT exam , Abdominal exam, Mental status exam WNL.  Comments: Pt. history reviewed/updated, physical exam performed prior to initiation of sedation?yes Exam Exam: Extent of exam reached: Cecum, extent intended: Cecum.  The cecum was identified by appendiceal orifice and IC valve. Patient position: on left side. Time to Cecum: 00:01:13. Time for Withdrawl: 00:16:24. Colon retroflexion performed. Images taken. ASA Classification: II. Tolerance: excellent.  Monitoring: Pulse and BP monitoring, Oximetry used. Supplemental O2 given.  Colon Prep Used Miralax for colon prep. Prep results: excellent.  Sedation Meds: Patient assessed and found to be appropriate for moderate (conscious) sedation. Fentanyl 50 mcg. given IV. Versed 7 mg. given IV.    Findings - MULTIPLE POLYPS: Ascending Colon to Transverse Colon. minimum size 1 mm, maximum size 3 mm. Procedure:  snare without cautery, removed, Polyp retrieved, 5 polyps Polyps sent to pathology. ICD9: Colon Polyps: 211.3. Comments: ascnd 78mm,2mm,3mm,3mm; trans 3mm.  NORMAL EXAM: Cecum to Rectum.  - DIVERTICULOSIS: Ascending Colon to Sigmoid Colon. ICD9: Diverticulosis, Colon: 562.10.   Assessment  Diagnoses: 562.10: Diverticulosis, Colon.  211.3: Colon Polyps.   Events  Unplanned Interventions: No intervention was required.  Unplanned Events: There were no complications. Plans Disposition: After procedure patient sent to recovery. After recovery patient sent home.  Scheduling/Referral: Colonoscopy, to Wilhemina Bonito. Marina Goodell, MD, in 2 years,    This report was created from the original endoscopy report, which was reviewed and signed by the above listed endoscopist.   cc:  Julieanne Manson, MD      The Patient

## 2011-03-18 NOTE — Op Note (Signed)
NAMECOLBEY, WIRTANEN                          ACCOUNT NO.:  [id number]   MEDICAL RECORD NO.:  [id number]                   PATIENT TYPE:  OIB   LOCATION:  2864                                 FACILITY:  [facility]   PHYSICIAN:  Lucky Cowboy, M.D.                    DATE OF BIRTH:  Jul 04, 1946   DATE OF PROCEDURE:  12/18/2002  DATE OF DISCHARGE:                                 OPERATIVE REPORT   PREOPERATIVE DIAGNOSIS:  Obstructive sleep apnea with bilateral inferior  turbinate hypertrophy.   POSTOPERATIVE DIAGNOSIS:  Obstructive sleep apnea with bilateral inferior  turbinate hypertrophy.   OPERATION PERFORMED:  Bilateral submucosal inferior turbinate resection.   SURGEON:  Lucky Cowboy, M.D.   ANESTHESIA:  General endotracheal.   ESTIMATED BLOOD LOSS:  20 cc.   SPECIMENS:  None.   COMPLICATIONS:  None.   INDICATIONS FOR PROCEDURE:  The patient is a 65 year old male with  obstructive sleep apnea.  He has been trying the CPAP machine for two  months.  He has not been able to tolerate this due to nasal obstruction.  He  has tried Flonase with inadequate relief.  For these reasons, bilateral  inferior turbinate reductions were performed.   FINDINGS:  The patient was noted to have a significant bony and mucosal  bilateral inferior turbinate hypertrophy.   DESCRIPTION OF PROCEDURE:  The patient was taken to the operating room and  placed on the table in the supine position.  He was then placed under  general endotracheal anesthesia.  The eyes were taped shut and head and body  draped in the usual fashion after prepping the nose with Betadine.  Each  nasal cavity was decongested with Afrin and both of the inferior turbinates  injected with 1% lidocaine with 1:100,000 epinephrine.  The 0 degree Storz-  Hopkins endoscope was then used.  Both turbinates were infractured using the  Therapist, nutritional.  The microdebrider was then used to remove redundant mucosa,  dissected off of the  underlying inferior turbinate bone.  After this was  performed, over the inferior half of both of the inferior turbinates,  redundant bone was taken down using the through-cut forceps.  Suction  cautery was used for hemostasis.  The nasopharynx and oropharynx were  suctioned.  The patient was awakened from anesthesia and extubated in the operating  room.  He was taken to the post anesthesia care unit in stable condition.  There were no complications.                                                Lucky Cowboy, M.D.    SJ/MEDQ  D:  12/18/2002  T:  12/18/2002  Job:  [id number]   cc:   Julieanne Manson  [address]  [place]  [place] [address]  Fax: [phone]

## 2011-07-08 ENCOUNTER — Encounter: Payer: Self-pay | Admitting: Internal Medicine

## 2011-09-02 ENCOUNTER — Other Ambulatory Visit: Payer: Self-pay | Admitting: Internal Medicine

## 2011-09-06 ENCOUNTER — Ambulatory Visit (AMBULATORY_SURGERY_CENTER): Payer: Medicare Other | Admitting: *Deleted

## 2011-09-06 ENCOUNTER — Encounter: Payer: Self-pay | Admitting: Internal Medicine

## 2011-09-06 DIAGNOSIS — Z800 Family history of malignant neoplasm of digestive organs: Secondary | ICD-10-CM

## 2011-09-06 DIAGNOSIS — Z86010 Personal history of colonic polyps: Secondary | ICD-10-CM

## 2011-09-06 DIAGNOSIS — Z1211 Encounter for screening for malignant neoplasm of colon: Secondary | ICD-10-CM

## 2011-09-06 MED ORDER — PEG-KCL-NACL-NASULF-NA ASC-C 100 G PO SOLR: 1.0000 | Freq: Once | ORAL | Status: DC

## 2011-09-20 ENCOUNTER — Encounter: Payer: Self-pay | Admitting: Internal Medicine

## 2011-09-20 ENCOUNTER — Ambulatory Visit (AMBULATORY_SURGERY_CENTER): Payer: Medicare Other | Admitting: Internal Medicine

## 2011-09-20 VITALS — BP 131/77 | HR 70 | Temp 98.4°F | Resp 20 | Ht 73.0 in | Wt 245.0 lb

## 2011-09-20 DIAGNOSIS — Z1211 Encounter for screening for malignant neoplasm of colon: Secondary | ICD-10-CM

## 2011-09-20 DIAGNOSIS — Z860100 Personal history of colon polyps, unspecified: Secondary | ICD-10-CM

## 2011-09-20 DIAGNOSIS — D126 Benign neoplasm of colon, unspecified: Secondary | ICD-10-CM

## 2011-09-20 DIAGNOSIS — Z800 Family history of malignant neoplasm of digestive organs: Secondary | ICD-10-CM

## 2011-09-20 LAB — HM COLONOSCOPY: HM Colonoscopy: NORMAL

## 2011-09-20 MED ORDER — SODIUM CHLORIDE 0.9 % IV SOLN: 500.0000 mL | INTRAVENOUS | Status: DC

## 2011-09-20 NOTE — Progress Notes (Signed)
Patient did not experience any of the following events: a burn prior to discharge; a fall within the facility; wrong site/side/patient/procedure/implant event; or a hospital transfer or hospital admission upon discharge from the facility. ([id number]) Patient did not have preoperative order for IV antibiotic SSI prophylaxis. ([id number])  

## 2011-09-20 NOTE — Patient Instructions (Signed)
FOLLOW THE DISCHARGE INSTRUCTIONS ON THE GREEN AND BLUE INSTRUCTION SHEETS.  AWAIT PATHOLOGY RESULTS.  NEXT COLONOSCOPY IN 1 YEAR.

## 2011-09-21 ENCOUNTER — Telehealth: Payer: Self-pay | Admitting: *Deleted

## 2011-09-21 NOTE — Telephone Encounter (Signed)

## 2012-06-27 ENCOUNTER — Ambulatory Visit: Payer: Self-pay | Admitting: Family Medicine

## 2012-07-04 ENCOUNTER — Ambulatory Visit: Payer: Self-pay | Admitting: Internal Medicine

## 2012-07-10 ENCOUNTER — Ambulatory Visit: Payer: Self-pay | Admitting: Family Medicine

## 2012-08-24 ENCOUNTER — Encounter: Payer: Self-pay | Admitting: Internal Medicine

## 2012-09-03 ENCOUNTER — Encounter: Payer: Self-pay | Admitting: Pulmonary Disease

## 2012-09-04 ENCOUNTER — Encounter: Payer: Self-pay | Admitting: Pulmonary Disease

## 2012-09-04 ENCOUNTER — Telehealth: Payer: Self-pay | Admitting: Pulmonary Disease

## 2012-09-04 ENCOUNTER — Ambulatory Visit (INDEPENDENT_AMBULATORY_CARE_PROVIDER_SITE_OTHER): Payer: Medicare Other | Admitting: Pulmonary Disease

## 2012-09-04 VITALS — BP 132/78 | HR 68 | Temp 97.9°F | Ht 73.0 in | Wt 255.4 lb

## 2012-09-04 DIAGNOSIS — J984 Other disorders of lung: Secondary | ICD-10-CM

## 2012-09-04 DIAGNOSIS — R05 Cough: Secondary | ICD-10-CM

## 2012-09-04 DIAGNOSIS — R0602 Shortness of breath: Secondary | ICD-10-CM | POA: Insufficient documentation

## 2012-09-04 DIAGNOSIS — G4733 Obstructive sleep apnea (adult) (pediatric): Secondary | ICD-10-CM | POA: Insufficient documentation

## 2012-09-04 NOTE — Progress Notes (Signed)
Subjective:    Patient ID: Matthew [name], male    DOB: 1946-01-06, 67 y.o.   MRN: [id number]  HPI  Matthew [name] is a very pleasant 66 year old male who comes her clinic today for evaluation of fatigue and shortness of breath. He had a normal childhood without respiratory illnesses and never smoked cigarettes. 7 or 8 years ago he had a vascular bypass surgery procedure performed at [facility] and had what sounds like a very severe complication with SVC syndrome and right hemidiaphragm injury requiring a lengthy hospital stay afterwards. Since then he has had some shortness of breath which is lingered for several years. His shortness of breath has increased in the last 12 months and daytime fatigue and somnolence has increased greatly.Marland Kitchen He had a recent sleep study showing an AHI of 70 but he has not been compliant with CPAP therapy because he doesn't like the way the mass pills.  He has sinus congestion with mucus production that falls back down into his throat but it sounds like he doesn't have much chest congestion. This has led to a cough which occurs quite frequently and is commonly productive of clear to yellow sputum production. He has had sinus surgery in the past. He has acid reflux which is fairly well controlled. He has taken Claritin in the past which did not help much.  He denies chest pain has had a recent nuclear stress test which was equivocal for coronary disease. It showed a possible old MI. His ejection fraction that study was greater than 60%.  He was started on long acting bronchodilators and inhaled corticosteroids this year. He noticed a significant difference with the 2 doors up but he doesn't think the Advair at a very much. He says that when he gets short of breath the albuterol does help.   Past Medical History  Diagnosis Date  . COPD (chronic obstructive pulmonary disease)   . GERD (gastroesophageal reflux disease)   . OSA (obstructive sleep apnea)   . Benign neoplasm  of colon   . Unspecified sinusitis (chronic)   . HTN (hypertension)   . ED (erectile dysfunction)   . Internal hemorrhoids without mention of complication   . Allergic rhinitis, cause unspecified   . Unspecified asthma      Family History  Problem Relation Age of Onset  . Colon cancer Sister   . Esophageal cancer Neg Hx   . Stomach cancer Neg Hx   . Breast cancer Sister   . Heart attack Sister     deceased from MI  . Allergies Father   . Heart attack Father   . CAD Father     deceased 66 from fall and possible subdural hematoma  . Congestive Heart Failure Mother   . Emphysema Mother     deceased 70 with emphysema, tobacco abuse  . Asthma Sister      History   Social History  . Marital Status: Married    Spouse Name: N/A    Number of Children: 2  . Years of Education: N/A   Occupational History  . retired     Holiday representative   Social History Main Topics  . Smoking status: Never Smoker   . Smokeless tobacco: Never Used  . Alcohol Use: 0.5 oz/week    1 drink(s) per week     Comment: social  . Drug Use: No  . Sexually Active: Not on file   Other Topics Concern  . Not on file   Social History Narrative  .  No narrative on file     No Known Allergies   Outpatient Prescriptions Prior to Visit  Medication Sig Dispense Refill  . DIPHENHYDRAMINE HCL PO Take 50 mg by mouth as needed.        . Diphenhydramine-APAP, sleep, (TYLENOL PM EXTRA STRENGTH PO) Take 1 tablet by mouth as needed.        . Oxymetazoline HCl (AFRIN NASAL SPRAY NA) Place into the nose as needed.        . [DISCONTINUED] Fluticasone-Salmeterol (ADVAIR DISKUS IN) Inhale into the lungs as needed.        . [DISCONTINUED] NON FORMULARY Bone and joint health vitamin 2 in the am and 2 pm       . [DISCONTINUED] Pseudoephedrine-Guaifenesin (MUCINEX D PO) Take 1,200 mg by mouth as needed.         Last reviewed on 09/04/2012  2:23 PM by Matthew [name], CMA    Review of Systems  Constitutional: Negative  for fever, chills, activity change and appetite change.  HENT: Positive for congestion. Negative for hearing loss, ear pain, rhinorrhea, sneezing, neck pain, neck stiffness, postnasal drip and sinus pressure.   Eyes: Negative for redness, itching and visual disturbance.  Respiratory: Positive for cough and shortness of breath. Negative for chest tightness and wheezing.   Cardiovascular: Negative for chest pain, palpitations and leg swelling.  Gastrointestinal: Negative for nausea, vomiting, abdominal pain, diarrhea, constipation, blood in stool and abdominal distention.  Musculoskeletal: Negative for myalgias, joint swelling, arthralgias and gait problem.  Skin: Negative for rash.  Neurological: Positive for headaches. Negative for dizziness, light-headedness and numbness.  Hematological: Does not bruise/bleed easily.  Psychiatric/Behavioral: Negative for confusion and dysphoric mood.       Objective:   Physical Exam  Filed Vitals:   09/04/12 1423  BP: 132/78  Pulse: 68  Temp: 97.9 F (36.6 C)  TempSrc: Oral  Height: 6\' 1"  (1.854 m)  Weight: 255 lb 6.4 oz (115.849 kg)  SpO2: 96%   Gen: well appearing, no acute distress HEENT: NCAT, PERRL, EOMi, OP clear, neck supple without masses PULM: Diminished R base CV: RRR, no mgr, no JVD AB: BS+, soft, nontender, no hsm Ext: warm, no edema, no clubbing, no cyanosis Derm: no rash or skin breakdown Neuro: A&Ox4, CN II-XII intact, strength 5/5 in all 4 extremities  07/2012 Sleep study >> AHI 70, rec CPAP 13cmH20 07/2012 CXR >> elevated R hemidiaphragm 04/2012 simple spirometry from PCP >> no obstruction     Assessment & Plan:   Shortness of breath After doing the simple spirometry result performed over that her legs and family practice I do not feel that Matthew [name] has COPD. However it is a little surprising that he has a clinical response to albuterol so it's possible that he could have some underlying asthma. He tells me that  multiple family members have this.  I suspect that the cause of his shortness of breath is his right-sided hemidiaphragm paresis. This apparently was a complication from surgery in 2006 at [facility] I will need to obtain records to better evaluate this. He has spirometric suggestion of restriction on his simple spirometry and we will need to confirm this with full pulmonary function testing. Because of his restriction I will obtain a CT of his chest as well to ensure there is no parenchymal abnormality. I doubt this will be the case.  I am uncertain as to how to interpret the abnormal nuclear stress test performed in August of this year.  He may need a cardiology evaluation to better evaluate this.  Plan: -Obtain CT chest -Obtain full pulmonary function testing with bronchodilator -Would stop either Tudorza or Advair -Given the fact that his stress test in 2012 was equivocal would consider a cardiology evaluation if pulmonary function testing is negative.    OSA (obstructive sleep apnea) Obstructive sleep apnea as severe as reported in his study is clearly a driving factor in his fatigue.  It is possible that the right-sided hemidiaphragm paresis is contributing to hypercarbia as well. If this is the case then BiPAP or an alternative mode of nocturnal ventilation (as opposed to CPAP) such as ASD or iVAPS may be a superior choice.  Plan: -Obtain daytime ABG -Advised need for compliance with CPAP therapy and to go back to the sleep center where he received his CPAP machine to find a mask that fits.  Cough His sinus congestion is contributing greatly to his mucus production and cough. I recommended that he start using Lloyd Huger med rinses and a nasal corticosteroid. If this does not help he may need to see ENT again.    Updated Medication List Outpatient Encounter Prescriptions as of 09/04/2012  Medication Sig Dispense Refill  . aspirin 81 MG tablet Take 81 mg by mouth daily.      Marland Kitchen DIPHENHYDRAMINE  HCL PO Take 50 mg by mouth as needed.        . Diphenhydramine-APAP, sleep, (TYLENOL PM EXTRA STRENGTH PO) Take 1 tablet by mouth as needed.        . Fluticasone-Salmeterol (ADVAIR DISKUS) 250-50 MCG/DOSE AEPB Inhale 1 puff into the lungs every 12 (twelve) hours.      . Homeopathic Products ([employer] LEG CRAMPS PAIN RELIEF PO) Take 1 tablet by mouth daily as needed.      . levalbuterol (XOPENEX HFA) 45 MCG/ACT inhaler Inhale 1-2 puffs into the lungs every 4 (four) hours as needed.      Marland Kitchen levothyroxine (SYNTHROID, LEVOTHROID) 50 MCG tablet Take 1 tablet by mouth daily.      . Multiple Vitamins-Minerals (CENTRUM SILVER ADULT 50+ PO) Take 1 tablet by mouth daily.      . Naproxen Sodium (ALEVE) 220 MG CAPS as needed.      . Oxymetazoline HCl (AFRIN NASAL SPRAY NA) Place into the nose as needed.        Marland Kitchen Phenylephrine-APAP-Guaifenesin (MUCINEX SINUS-MAX CONGESTION) 5-325-200 MG TABS Per bottle directions as needed      . pravastatin (PRAVACHOL) 20 MG tablet Take 20 mg by mouth daily.      . [DISCONTINUED] Fluticasone-Salmeterol (ADVAIR DISKUS IN) Inhale into the lungs as needed.        . [DISCONTINUED] NON FORMULARY Bone and joint health vitamin 2 in the am and 2 pm       . [DISCONTINUED] Pseudoephedrine-Guaifenesin (MUCINEX D PO) Take 1,200 mg by mouth as needed.

## 2012-09-04 NOTE — Assessment & Plan Note (Signed)
Obstructive sleep apnea as severe as reported in his study is clearly a driving factor in his fatigue.  It is possible that the right-sided hemidiaphragm paresis is contributing to hypercarbia as well. If this is the case then BiPAP or an alternative mode of nocturnal ventilation (as opposed to CPAP) such as ASD or iVAPS may be a superior choice.  Plan: -Obtain daytime ABG -Advised need for compliance with CPAP therapy and to go back to the sleep center where he received his CPAP machine to find a mask that fits.

## 2012-09-04 NOTE — Telephone Encounter (Signed)
LMTCB according to instruct pt to have CT and then return. Carron Curie, CMA

## 2012-09-04 NOTE — Telephone Encounter (Signed)
Patient Instructions     We will obtain records regarding your sleep study and stress test and lab work from Dr. Elisabeth Cara office  We will have you get a CT scan of your chest at [facility]  We will have you get a full pulmonary function test and arterial blood gas at [facility]  We will get records from your hospitalization at [facility] Med rinses with distilled water at least twice per day using the instructions on the package.  1/2 hour after using the [facility] Med rinse, use Nasonex two puffs in each nostril once per day.  Use chlortrimeton and an over the counter decongestant (pseudophed or phenylephrine) as needed for the cough.  Go back to the place where you got your CPAP machine and try to find a more comfortable mask so you can wear it all night.  We will see you back in two weeks or sooner if needed.   ------   Lm w/ spouse tcb x1

## 2012-09-04 NOTE — Assessment & Plan Note (Signed)
After doing the simple spirometry result performed over that her legs and family practice I do not feel that Matthew [name] has COPD. However it is a little surprising that he has a clinical response to albuterol so it's possible that he could have some underlying asthma. He tells me that multiple family members have this.  I suspect that the cause of his shortness of breath is his right-sided hemidiaphragm paresis. This apparently was a complication from surgery in 2006 at [facility]. I will need to obtain records to better evaluate this. He has spirometric suggestion of restriction on his simple spirometry and we will need to confirm this with full pulmonary function testing. Because of his restriction I will obtain a CT of his chest as well to ensure there is no parenchymal abnormality. I doubt this will be the case.  I am uncertain as to how to interpret the abnormal nuclear stress test performed in August of this year. He may need a cardiology evaluation to better evaluate this.  Plan: -Obtain CT chest -Obtain full pulmonary function testing with bronchodilator -Would stop either Tudorza or Advair -Given the fact that his stress test in 2012 was equivocal would consider a cardiology evaluation if pulmonary function testing is negative.

## 2012-09-04 NOTE — Patient Instructions (Addendum)
We will obtain records regarding your sleep study and stress test and lab work from Dr. Elisabeth Cara office We will have you get a CT scan of your chest at [facility] We will have you get a full pulmonary function test and arterial blood gas at [facility] We will get records from your hospitalization at [facility] Med rinses with distilled water at least twice per day using the instructions on the package. 1/2 hour after using the [facility] Med rinse, use Nasonex two puffs in each nostril once per day. Use chlortrimeton and an over the counter decongestant (pseudophed or phenylephrine) as needed for the cough. Go back to the place where you got your CPAP machine and try to find a more comfortable mask so you can wear it all night. We will see you back in two weeks or sooner if needed.

## 2012-09-04 NOTE — Telephone Encounter (Signed)
Pt returned call. Matthew [name] °

## 2012-09-06 DIAGNOSIS — R059 Cough, unspecified: Secondary | ICD-10-CM | POA: Insufficient documentation

## 2012-09-06 DIAGNOSIS — R05 Cough: Secondary | ICD-10-CM | POA: Insufficient documentation

## 2012-09-06 NOTE — Telephone Encounter (Signed)
LMOMTCB x 1 for the pt. 

## 2012-09-06 NOTE — Assessment & Plan Note (Signed)
His sinus congestion is contributing greatly to his mucus production and cough. I recommended that he start using Lloyd Huger med rinses and a nasal corticosteroid. If this does not help he may need to see ENT again.

## 2012-09-07 NOTE — Telephone Encounter (Signed)
I spoke with pt and is aware. He stated he will call back to scheduled his f/u appt bc he is at a job site now.

## 2012-09-18 ENCOUNTER — Encounter: Payer: Self-pay | Admitting: Internal Medicine

## 2012-09-25 ENCOUNTER — Ambulatory Visit: Payer: Self-pay | Admitting: Pulmonary Disease

## 2012-09-25 LAB — PULMONARY FUNCTION TEST

## 2012-10-01 ENCOUNTER — Telehealth: Payer: Self-pay | Admitting: *Deleted

## 2012-10-01 ENCOUNTER — Encounter: Payer: Self-pay | Admitting: Pulmonary Disease

## 2012-10-01 NOTE — Telephone Encounter (Signed)
  Lupita Leash, MD More Detail >>      Lupita Leash, MD      Sent: Mon October 01, 2012 10:16 AM    To: Christen Butter, CMA        Matthew [name]    MRN: [id number] DOB: Aug 11, 1946          Message     L,         Please let him know that his Chest CT was normal too         B    Called and spoke with the pt's spouse and notified that per Dr. Kendrick Fries- PFT, ABG, and CT Chest were normal.  She verbalized understanding and states will inform the pt.

## 2012-10-01 NOTE — Telephone Encounter (Signed)
Message copied by Christen Butter on Mon Oct 01, 2012 10:34 AM ------      Message from: Max Fickle B      Created: Mon Oct 01, 2012 10:13 AM       L,            Please let him know that his PFT's and ABG were completely normal.            B

## 2012-10-19 ENCOUNTER — Encounter: Payer: Self-pay | Admitting: Pulmonary Disease

## 2012-11-02 ENCOUNTER — Ambulatory Visit (AMBULATORY_SURGERY_CENTER): Payer: Medicare Other | Admitting: *Deleted

## 2012-11-02 VITALS — Ht 73.0 in | Wt 253.6 lb

## 2012-11-02 DIAGNOSIS — Z1211 Encounter for screening for malignant neoplasm of colon: Secondary | ICD-10-CM

## 2012-11-02 DIAGNOSIS — Z85038 Personal history of other malignant neoplasm of large intestine: Secondary | ICD-10-CM

## 2012-11-02 DIAGNOSIS — Z800 Family history of malignant neoplasm of digestive organs: Secondary | ICD-10-CM

## 2012-11-02 DIAGNOSIS — Z86010 Personal history of colonic polyps: Secondary | ICD-10-CM

## 2012-11-02 MED ORDER — PEG-KCL-NACL-NASULF-NA ASC-C 100 G PO SOLR: ORAL | Status: DC

## 2012-11-02 NOTE — Progress Notes (Signed)
No allergies to eggs or soy products.  Pt told to bring inhalers to his procedure

## 2012-11-13 ENCOUNTER — Ambulatory Visit (AMBULATORY_SURGERY_CENTER): Payer: Medicare Other | Admitting: Internal Medicine

## 2012-11-13 ENCOUNTER — Encounter: Payer: Self-pay | Admitting: Internal Medicine

## 2012-11-13 VITALS — BP 145/86 | HR 64 | Temp 97.0°F | Resp 17 | Ht 73.0 in | Wt 253.6 lb

## 2012-11-13 DIAGNOSIS — Z1211 Encounter for screening for malignant neoplasm of colon: Secondary | ICD-10-CM

## 2012-11-13 DIAGNOSIS — Z800 Family history of malignant neoplasm of digestive organs: Secondary | ICD-10-CM

## 2012-11-13 DIAGNOSIS — D126 Benign neoplasm of colon, unspecified: Secondary | ICD-10-CM

## 2012-11-13 DIAGNOSIS — Z86010 Personal history of colonic polyps: Secondary | ICD-10-CM

## 2012-11-13 DIAGNOSIS — Z85038 Personal history of other malignant neoplasm of large intestine: Secondary | ICD-10-CM

## 2012-11-13 MED ORDER — SODIUM CHLORIDE 0.9 % IV SOLN: 500.0000 mL | INTRAVENOUS | Status: DC

## 2012-11-13 NOTE — Progress Notes (Signed)
Called to room to assist during endoscopic procedure.  Patient ID and intended procedure confirmed with present staff. Received instructions for my participation in the procedure from the performing physician.  

## 2012-11-13 NOTE — Op Note (Signed)
[facility] [address] [place], [address]   COLONOSCOPY PROCEDURE REPORT  PATIENT: Matthew, [name]  MR#: [id number] BIRTHDATE: 1945/11/24 , 66  yrs. old GENDER: Male ENDOSCOPIST: Roxy Cedar, MD REFERRED [id number] Program Recall PROCEDURE DATE:  11/13/2012 PROCEDURE:   Colonoscopy with snare polypectomy    x 1 ASA CLASS:   Class II INDICATIONS:High risk patient with personal history of colon cancer (2006 malignant polyp), Patient's personal history of adenomatous colon polyps - 2002 w/ TVA;03,06,08,2010 w/ 9 polyps; 2011,2012, and Patient's immediate family history of colon cancer (sister). MEDICATIONS: MAC sedation, administered by CRNA and propofol (Diprivan) 200mg  IV  DESCRIPTION OF PROCEDURE:   After the risks benefits and alternatives of the procedure were thoroughly explained, informed consent was obtained.  A digital rectal exam revealed no abnormalities of the rectum.   The LB [id number]  endoscope was introduced through the anus and advanced to the cecum, which was identified by both the appendix and ileocecal valve. No adverse events experienced.   The quality of the prep was excellent, using MoviPrep  The instrument was then slowly withdrawn as the colon was fully examined.      COLON FINDINGS: A diminutive polyp was found in the transverse colon.  A polypectomy was performed with a cold snare.  The resection was complete and the polyp tissue was completely retrieved.   Moderate diverticulosis was noted The finding was in the left colon.   The colon mucosa was otherwise normal. Retroflexed views revealed internal hemorrhoids. The time to cecum=1 minutes 49 seconds.  Withdrawal time=9 minutes 14 seconds. The scope was withdrawn and the procedure completed. COMPLICATIONS: There were no complications.  ENDOSCOPIC IMPRESSION: 1.   Diminutive polyp was found in the transverse colon; polypectomy was performed with a cold  snare 2.   Moderate diverticulosis was noted in the left colon 3.   The colon mucosa was otherwise normal  RECOMMENDATIONS: 1. Repeat Colonoscopy in 2 years.   eSigned:  Roxy Cedar, MD 11/13/2012 9:29 AM   cc: Julieanne Manson, MD and The Patient   PATIENT NAME:  Matthew, [name] MR#: [id number]

## 2012-11-13 NOTE — Patient Instructions (Addendum)
YOU HAD AN ENDOSCOPIC PROCEDURE TODAY AT THE [facility]: Refer to the procedure report that was given to you for any specific questions about what was found during the examination.  If the procedure report does not answer your questions, please call your gastroenterologist to clarify.  If you requested that your care partner not be given the details of your procedure findings, then the procedure report has been included in a sealed envelope for you to review at your convenience later.  YOU SHOULD EXPECT: Some feelings of bloating in the abdomen. Passage of more gas than usual.  Walking can help get rid of the air that was put into your GI tract during the procedure and reduce the bloating. If you had a lower endoscopy (such as a colonoscopy or flexible sigmoidoscopy) you may notice spotting of blood in your stool or on the toilet paper. If you underwent a bowel prep for your procedure, then you may not have a normal bowel movement for a few days.  DIET: Your first meal following the procedure should be a light meal and then it is ok to progress to your normal diet.  A half-sandwich or bowl of soup is an example of a good first meal.  Heavy or fried foods are harder to digest and may make you feel nauseous or bloated.  Likewise meals heavy in dairy and vegetables can cause extra gas to form and this can also increase the bloating.  Drink plenty of fluids but you should avoid alcoholic beverages for 24 hours.  ACTIVITY: Your care partner should take you home directly after the procedure.  You should plan to take it easy, moving slowly for the rest of the day.  You can resume normal activity the day after the procedure however you should NOT DRIVE or use heavy machinery for 24 hours (because of the sedation medicines used during the test).    SYMPTOMS TO REPORT IMMEDIATELY: A gastroenterologist can be reached at any hour.  During normal business hours, 8:30 AM to 5:00 PM Monday through Friday,  call [phone].  After hours and on weekends, please call the GI answering service at [phone] who will take a message and have the physician on call contact you.   Following lower endoscopy (colonoscopy or flexible sigmoidoscopy):  Excessive amounts of blood in the stool  Significant tenderness or worsening of abdominal pains  Swelling of the abdomen that is new, acute  Fever of 100F or higher   FOLLOW UP: If any biopsies were taken you will be contacted by phone or by letter within the next 1-3 weeks.  Call your gastroenterologist if you have not heard about the biopsies in 3 weeks.  Our staff will call the home number listed on your records the next business day following your procedure to check on you and address any questions or concerns that you may have at that time regarding the information given to you following your procedure. This is a courtesy call and so if there is no answer at the home number and we have not heard from you through the emergency physician on call, we will assume that you have returned to your regular daily activities without incident.  Polyp, diverticulosis and high fiber diet information given.   Next colonoscopy 2 years-2016.  SIGNATURES/CONFIDENTIALITY: You and/or your care partner have signed paperwork which will be entered into your electronic medical record.  These signatures attest to the fact that that the information above on your After  Visit Summary has been reviewed and is understood.  Full responsibility of the confidentiality of this discharge information lies with you and/or your care-partner.

## 2012-11-13 NOTE — Progress Notes (Signed)
Lidocaine-40mg IV prior to Propofol InductionPropofol given over incremental dosages 

## 2012-11-13 NOTE — Progress Notes (Signed)
Patient did not experience any of the following events: a burn prior to discharge; a fall within the facility; wrong site/side/patient/procedure/implant event; or a hospital transfer or hospital admission upon discharge from the facility. ([id number]) Patient did not have preoperative order for IV antibiotic SSI prophylaxis. ([id number])  

## 2012-11-13 NOTE — Progress Notes (Addendum)
No allergy to eggs or soy per pt. ewm  1st IV attempt pt very anxious, yelling out, moving all around in bed, stating how painful it was, catheter tip only inserted and removed immediately due to pts reaction. IV started in right [facility] with no problems,  No complications . Pt tolerated Iv start well. emw  Pt states he has never had colon cancer himself. ewm

## 2012-11-14 ENCOUNTER — Telehealth: Payer: Self-pay | Admitting: *Deleted

## 2012-11-14 NOTE — Telephone Encounter (Signed)
  Follow up Call-  Call back number 11/13/2012 09/20/2011  Post procedure Call Back phone  # [phone] LEAVE MESSAGE.  Permission to leave phone message Yes -     Patient questions:  Do you have a fever, pain , or abdominal swelling? no Pain Score  0 *  Have you tolerated food without any problems? yes  Have you been able to return to your normal activities? yes  Do you have any questions about your discharge instructions: Diet   no Medications  no Follow up visit  no  Do you have questions or concerns about your Care? no  Actions: * If pain score is 4 or above: No action needed, pain <4.

## 2012-11-19 ENCOUNTER — Encounter: Payer: Self-pay | Admitting: Internal Medicine

## 2013-05-24 ENCOUNTER — Ambulatory Visit: Payer: Self-pay | Admitting: Specialist

## 2014-05-07 DIAGNOSIS — K219 Gastro-esophageal reflux disease without esophagitis: Secondary | ICD-10-CM | POA: Insufficient documentation

## 2014-05-09 LAB — LIPID PANEL
Cholesterol: 131 mg/dL (ref 0–200)
HDL: 42 mg/dL (ref 35–70)
LDL Cholesterol: 56 mg/dL
LDl/HDL Ratio: 1.3
Triglycerides: 163 mg/dL — AB (ref 40–160)

## 2014-05-09 LAB — PSA: PSA: 0.5

## 2014-05-09 LAB — CBC AND DIFFERENTIAL
HCT: 44 % (ref 41–53)
Hemoglobin: 15 g/dL (ref 13.5–17.5)
Neutrophils Absolute: 60 /uL
Platelets: 228 10*3/uL (ref 150–399)
WBC: 6.3 10^3/mL

## 2014-05-27 ENCOUNTER — Ambulatory Visit: Payer: Self-pay | Admitting: Specialist

## 2014-06-24 ENCOUNTER — Ambulatory Visit: Payer: Self-pay | Admitting: Urology

## 2014-06-26 ENCOUNTER — Ambulatory Visit: Payer: Self-pay | Admitting: Urology

## 2014-06-26 LAB — BASIC METABOLIC PANEL
Anion Gap: 8 (ref 7–16)
BUN: 16 mg/dL (ref 7–18)
Calcium, Total: 8.4 mg/dL — ABNORMAL LOW (ref 8.5–10.1)
Chloride: 106 mmol/L (ref 98–107)
Co2: 27 mmol/L (ref 21–32)
Creatinine: 0.97 mg/dL (ref 0.60–1.30)
EGFR (African American): >60
EGFR (Non-African Amer.): >60
Glucose: 161 mg/dL — ABNORMAL HIGH (ref 65–99)
Osmolality: 286 (ref 275–301)
Potassium: 3.7 mmol/L (ref 3.5–5.1)
Sodium: 141 mmol/L (ref 136–145)

## 2014-06-26 LAB — URINALYSIS, COMPLETE
Bacteria: NONE SEEN
Bilirubin,UR: NEGATIVE
Blood: NEGATIVE
Glucose,UR: NEGATIVE mg/dL (ref 0–75)
Ketone: NEGATIVE
Leukocyte Esterase: NEGATIVE
Nitrite: NEGATIVE
Ph: 6 (ref 4.5–8.0)
Protein: NEGATIVE
RBC,UR: 1 /HPF (ref 0–5)
Specific Gravity: 1.02 (ref 1.003–1.030)
Squamous Epithelial: NONE SEEN
WBC UR: <1 /HPF (ref 0–5)

## 2014-06-28 LAB — URINE CULTURE

## 2014-07-02 ENCOUNTER — Ambulatory Visit: Payer: Self-pay | Admitting: Urology

## 2014-07-04 LAB — PATHOLOGY REPORT

## 2014-07-08 ENCOUNTER — Ambulatory Visit: Payer: Self-pay | Admitting: Urology

## 2014-07-31 DIAGNOSIS — C6292 Malignant neoplasm of left testis, unspecified whether descended or undescended: Secondary | ICD-10-CM

## 2014-07-31 HISTORY — DX: Malignant neoplasm of left testis, unspecified whether descended or undescended: C62.92

## 2014-08-04 LAB — TSH: TSH: 2.01 u[IU]/mL (ref <=5.90)

## 2014-11-06 ENCOUNTER — Ambulatory Visit: Payer: Self-pay | Admitting: Urology

## 2014-11-06 DIAGNOSIS — N200 Calculus of kidney: Secondary | ICD-10-CM | POA: Diagnosis not present

## 2014-11-06 DIAGNOSIS — Z8547 Personal history of malignant neoplasm of testis: Secondary | ICD-10-CM | POA: Diagnosis not present

## 2014-11-06 DIAGNOSIS — C6290 Malignant neoplasm of unspecified testis, unspecified whether descended or undescended: Secondary | ICD-10-CM | POA: Diagnosis not present

## 2014-11-06 DIAGNOSIS — Z9079 Acquired absence of other genital organ(s): Secondary | ICD-10-CM | POA: Diagnosis not present

## 2014-11-06 DIAGNOSIS — I2510 Atherosclerotic heart disease of native coronary artery without angina pectoris: Secondary | ICD-10-CM | POA: Diagnosis not present

## 2014-11-11 DIAGNOSIS — C6290 Malignant neoplasm of unspecified testis, unspecified whether descended or undescended: Secondary | ICD-10-CM | POA: Diagnosis not present

## 2014-11-11 DIAGNOSIS — R5383 Other fatigue: Secondary | ICD-10-CM | POA: Diagnosis not present

## 2014-11-13 DIAGNOSIS — R140 Abdominal distension (gaseous): Secondary | ICD-10-CM | POA: Diagnosis not present

## 2014-11-13 DIAGNOSIS — Z23 Encounter for immunization: Secondary | ICD-10-CM | POA: Diagnosis not present

## 2014-11-13 DIAGNOSIS — K219 Gastro-esophageal reflux disease without esophagitis: Secondary | ICD-10-CM | POA: Diagnosis not present

## 2014-11-13 DIAGNOSIS — I10 Essential (primary) hypertension: Secondary | ICD-10-CM | POA: Diagnosis not present

## 2014-11-13 DIAGNOSIS — C6292 Malignant neoplasm of left testis, unspecified whether descended or undescended: Secondary | ICD-10-CM | POA: Diagnosis not present

## 2014-11-13 DIAGNOSIS — G4700 Insomnia, unspecified: Secondary | ICD-10-CM | POA: Diagnosis not present

## 2014-11-13 LAB — BASIC METABOLIC PANEL
BUN: 20 mg/dL (ref 4–21)
Creatinine: 0.9 mg/dL (ref <=1.3)
Glucose: 88 mg/dL
Potassium: 4.3 mmol/L (ref 3.4–5.3)
Sodium: 142 mmol/L (ref 137–147)

## 2014-11-13 LAB — HEPATIC FUNCTION PANEL
ALT: 30 U/L (ref 10–40)
AST: 22 U/L (ref 14–40)
Alkaline Phosphatase: 60 U/L (ref 25–125)
Bilirubin, Total: 0.4 mg/dL

## 2014-11-26 ENCOUNTER — Encounter: Payer: Self-pay | Admitting: Internal Medicine

## 2014-12-01 HISTORY — PX: DENTAL SURGERY: SHX609

## 2014-12-24 DIAGNOSIS — G250 Essential tremor: Secondary | ICD-10-CM | POA: Diagnosis not present

## 2014-12-24 DIAGNOSIS — G4750 Parasomnia, unspecified: Secondary | ICD-10-CM | POA: Diagnosis not present

## 2014-12-24 DIAGNOSIS — R0989 Other specified symptoms and signs involving the circulatory and respiratory systems: Secondary | ICD-10-CM | POA: Diagnosis not present

## 2014-12-24 DIAGNOSIS — Z23 Encounter for immunization: Secondary | ICD-10-CM | POA: Diagnosis not present

## 2014-12-25 ENCOUNTER — Ambulatory Visit: Payer: Self-pay | Admitting: Family Medicine

## 2014-12-25 DIAGNOSIS — R0989 Other specified symptoms and signs involving the circulatory and respiratory systems: Secondary | ICD-10-CM | POA: Diagnosis not present

## 2014-12-25 DIAGNOSIS — I6523 Occlusion and stenosis of bilateral carotid arteries: Secondary | ICD-10-CM | POA: Diagnosis not present

## 2014-12-25 DIAGNOSIS — I6521 Occlusion and stenosis of right carotid artery: Secondary | ICD-10-CM | POA: Diagnosis not present

## 2015-02-21 NOTE — Op Note (Signed)
PATIENT NAME:  Matthew [name], Matthew [name] MR#:  [id number] DATE OF BIRTH:  [birth year]  DATE OF PROCEDURE:  07/02/2014  PREOPERATIVE DIAGNOSIS:  Left testicular mass.   POSTOPERATIVE DIAGNOSIS:  Left testicular mass.   PROCEDURE PERFORMED:  Left radical orchiectomy (inguinal approach).    ATTENDING SURGEON:  Sherlynn Stalls, MD  ANESTHESIA: General anesthesia.   ESTIMATED BLOOD LOSS:  Minimal.  DRAINS:  None.   COMPLICATIONS: None.   SPECIMENS:  Left testicle.   INDICATION:  This is a 69 year old male with an enlarging left testicular mass, who presented to the Urology Clinic.  He was counseled on his various options and has elected to undergo left orchiectomy through an inguinal approach.  Risks and benefits of the procedure were explained in detail and the patient agreed to proceed as planned.   DESCRIPTION OF PROCEDURE: The patient was correctly identified in the preoperative holding area and informed consent was confirmed.  He was brought to the operating suite and placed on the table in the supine position.  At this time, a time-out protocol was performed.  All team members were identified. Venodyne boots were placed and he was administered 2 grams of IV Ancef in the perioperative period.  He was then placed under general anesthesia and shaved by the attending surgeon and prepped and draped in the standard surgical fashion.  At this point in time, an approximately 6 cm long transverse incision was made in the left subinguinal area and this was carried down through the subcutaneous tissue using Bovie electrocautery.  Of note, prior to the incision, I did inject 10 mL of 0.5% lidocaine.  The left testicular cord was then identified emerging from the left external ring.  This was grasped using a Babcock and dissected free from the surrounding tissues.  Bovie electrocautery was used for hemostasis. At this time, the left testicle was then delivered into the field and the gubernacular attachments were  removed piece-wise using Bovie electrocautery and then ultimately tied using a 3-0 Vicryl tie for additional hemostasis.  This, thereby, freed the left testicle.  The cord was then dissected proximally and care was taken to avoid any injury to the ilioinguinal nerves.  This was carefully dissected off of the cord structure.  The dissection was carried up to just within the internal ring and the cord was then clamped using a large Kelly and transected.  This specimen was passed off the field and labeled as left testicle.  The cord was then divided into 2 pockets and suture-ligated using 2-0 silk suture.  Excellent hemostasis was noted at this point.  The tail of one suture was left long to help with future identification of the cord should that become necessary.  At this point in time, the wound was copiously irrigated and inspected for bleeding.  There was no additional bleeding noted.  The Scarpa layer was then closed using interrupted 3-0 Vicryl sutures.  The skin was then closed using a running 4-0 Monocryl suture in a subcuticular fashion.  Additional 10 mL of local anesthesia was then administered to the wound site.  Dermabond dressing was applied.  The incision was clean and dried and dressing of scrotal fluffs and a scrotal support were applied.  The patient was then reversed from anesthesia and taken to the PACU in stable condition.  There were no complications in this case.     ____________________________ Sherlynn Stalls, MD ajb:lr D: 07/02/2014 18:53:03 ET T: 07/02/2014 19:14:49 ET JOB#: [id number]  cc: Fransisco Beau.  Erlene Quan, MD, <Dictator> Sherlynn Stalls MD ELECTRONICALLY SIGNED 08/07/2014 9:01

## 2015-03-03 DIAGNOSIS — R5383 Other fatigue: Secondary | ICD-10-CM | POA: Diagnosis not present

## 2015-03-03 DIAGNOSIS — C6290 Malignant neoplasm of unspecified testis, unspecified whether descended or undescended: Secondary | ICD-10-CM | POA: Diagnosis not present

## 2015-03-09 ENCOUNTER — Encounter: Payer: Self-pay | Admitting: Internal Medicine

## 2015-03-11 DIAGNOSIS — G4700 Insomnia, unspecified: Secondary | ICD-10-CM | POA: Insufficient documentation

## 2015-03-11 DIAGNOSIS — D126 Benign neoplasm of colon, unspecified: Secondary | ICD-10-CM | POA: Insufficient documentation

## 2015-03-11 DIAGNOSIS — J449 Chronic obstructive pulmonary disease, unspecified: Secondary | ICD-10-CM | POA: Insufficient documentation

## 2015-03-11 DIAGNOSIS — J45909 Unspecified asthma, uncomplicated: Secondary | ICD-10-CM | POA: Insufficient documentation

## 2015-03-11 DIAGNOSIS — E785 Hyperlipidemia, unspecified: Secondary | ICD-10-CM | POA: Insufficient documentation

## 2015-03-11 DIAGNOSIS — K2100 Gastro-esophageal reflux disease with esophagitis, without bleeding: Secondary | ICD-10-CM | POA: Insufficient documentation

## 2015-03-11 DIAGNOSIS — E039 Hypothyroidism, unspecified: Secondary | ICD-10-CM | POA: Insufficient documentation

## 2015-03-11 DIAGNOSIS — K219 Gastro-esophageal reflux disease without esophagitis: Secondary | ICD-10-CM | POA: Insufficient documentation

## 2015-03-11 DIAGNOSIS — N529 Male erectile dysfunction, unspecified: Secondary | ICD-10-CM | POA: Insufficient documentation

## 2015-03-11 DIAGNOSIS — G250 Essential tremor: Secondary | ICD-10-CM | POA: Insufficient documentation

## 2015-03-11 DIAGNOSIS — I10 Essential (primary) hypertension: Secondary | ICD-10-CM | POA: Insufficient documentation

## 2015-03-11 DIAGNOSIS — J309 Allergic rhinitis, unspecified: Secondary | ICD-10-CM | POA: Insufficient documentation

## 2015-04-06 ENCOUNTER — Other Ambulatory Visit: Payer: Self-pay | Admitting: Family Medicine

## 2015-04-06 ENCOUNTER — Telehealth: Payer: Self-pay | Admitting: Urology

## 2015-04-06 DIAGNOSIS — C6290 Malignant neoplasm of unspecified testis, unspecified whether descended or undescended: Secondary | ICD-10-CM

## 2015-04-06 NOTE — Telephone Encounter (Addendum)
Patient's wife called inquiring about patient's CT scan.  His last CT scan was on 11-06-14.  Your note in [employer] states that a 6 month follow up CT is needed, but also states 04/2015.  I contacted the patient's insurance and they have denied a CT in June and state that a 6 month is required = July 2016.    Patient's wife wanted me to clarify with you that a CT abdomen & pelvis in July is okay.    July is fine.  Please schedule for July 2016.   Hollice Espy, MD

## 2015-04-07 ENCOUNTER — Telehealth: Payer: Self-pay | Admitting: Urology

## 2015-04-07 NOTE — Telephone Encounter (Signed)
Pt called and left a message wanting to know the result of his blood work which was done a couple weeks ago. Best contact # [phone] 04/07/15 maf

## 2015-04-07 NOTE — Telephone Encounter (Signed)
Can pt have lab results from 03/03/15? Cw,lpn

## 2015-04-07 NOTE — Telephone Encounter (Signed)
Please let the patient know that his testosterone continues to improve, most recently 391 which is within normal limits. His Aquilla and LH continue to show signs that his solitary testicle is recovering.    Hollice Espy, MD

## 2015-04-10 ENCOUNTER — Ambulatory Visit (AMBULATORY_SURGERY_CENTER): Payer: Self-pay | Admitting: *Deleted

## 2015-04-10 ENCOUNTER — Ambulatory Visit: Admission: RE | Admit: 2015-04-10 | Payer: Medicare Other | Source: Ambulatory Visit

## 2015-04-10 VITALS — Ht 74.0 in | Wt 250.2 lb

## 2015-04-10 DIAGNOSIS — Z800 Family history of malignant neoplasm of digestive organs: Secondary | ICD-10-CM

## 2015-04-10 DIAGNOSIS — Z86010 Personal history of colonic polyps: Secondary | ICD-10-CM

## 2015-04-10 MED ORDER — NA SULFATE-K SULFATE-MG SULF 17.5-3.13-1.6 GM/177ML PO SOLN: 1.0000 | Freq: Once | ORAL | Status: DC

## 2015-04-10 NOTE — Telephone Encounter (Signed)
Patient is scheduled for a CT abdomen & pelvis with and without contrast ([id number]) on 05/21/15 at 10:00am at the [facility].  Patient is also scheduled for a follow up appointment with Dr. Erlene Quan on 05/22/15 at 1:30pm.  Patient's wife was notified of these appointments.

## 2015-04-10 NOTE — Progress Notes (Signed)
Denies allergies to eggs or soy products. Denies complications with sedation or anesthesia. Denies O2 use. Denies use of diet or weight loss medications.  Emmi instructions given for colonoscopy.  

## 2015-04-13 ENCOUNTER — Ambulatory Visit: Payer: Self-pay

## 2015-04-13 NOTE — Telephone Encounter (Signed)
Pt again has called back returning a missed call from someone.  04/13/15 MAF

## 2015-04-23 ENCOUNTER — Encounter: Payer: Self-pay | Admitting: Internal Medicine

## 2015-05-08 ENCOUNTER — Ambulatory Visit: Payer: Medicare Other

## 2015-05-11 ENCOUNTER — Ambulatory Visit: Payer: Self-pay | Admitting: Family Medicine

## 2015-05-13 ENCOUNTER — Encounter: Payer: Self-pay | Admitting: Internal Medicine

## 2015-05-14 ENCOUNTER — Ambulatory Visit (INDEPENDENT_AMBULATORY_CARE_PROVIDER_SITE_OTHER): Payer: Medicare Other | Admitting: Family Medicine

## 2015-05-14 VITALS — BP 128/76 | HR 66 | Temp 97.8°F | Resp 16 | Wt 252.0 lb

## 2015-05-14 DIAGNOSIS — I2510 Atherosclerotic heart disease of native coronary artery without angina pectoris: Secondary | ICD-10-CM

## 2015-05-14 DIAGNOSIS — R5382 Chronic fatigue, unspecified: Secondary | ICD-10-CM

## 2015-05-14 DIAGNOSIS — I10 Essential (primary) hypertension: Secondary | ICD-10-CM

## 2015-05-14 DIAGNOSIS — E785 Hyperlipidemia, unspecified: Secondary | ICD-10-CM | POA: Diagnosis not present

## 2015-05-14 DIAGNOSIS — N529 Male erectile dysfunction, unspecified: Secondary | ICD-10-CM | POA: Diagnosis not present

## 2015-05-14 DIAGNOSIS — E039 Hypothyroidism, unspecified: Secondary | ICD-10-CM

## 2015-05-14 MED ORDER — SILDENAFIL CITRATE 100 MG PO TABS: 100.0000 mg | ORAL_TABLET | Freq: Every day | ORAL | Status: DC | PRN

## 2015-05-14 NOTE — Progress Notes (Signed)
Patient ID: Matthew [name], male   DOB: 1945/12/28, 69 y.o.   MRN: [id number]    Subjective:  HPI  Hypertension, follow-up:  BP Readings from Last 3 Encounters:  05/14/15 128/76  12/24/14 124/78  11/13/12 145/86    He was last seen for hypertension 6 months ago.  BP at that visit was 124/78. Management changes since that visit include none. He reports good compliance with treatment. He is not having side effects.  He is exercising. Outside blood pressures are occasionally He is experiencing none.    A new issue to address is mildly worsening ED. Patient would like to try Viagra/Cilas/ Levitra. He has tried some of these in the past and they have work. Weight trend: stable Wt Readings from Last 3 Encounters:  05/14/15 252 lb (114.306 kg)  04/10/15 250 lb 3.2 oz (113.49 kg)  12/24/14 251 lb (113.853 kg)   ------------------------------------------------------------------------    Prior to Admission medications   Medication Sig Start Date End Date Taking? Authorizing Provider  aspirin 81 MG tablet Take 81 mg by mouth daily.   Yes Historical Provider, MD  atorvastatin (LIPITOR) 20 MG tablet Take by mouth.   Yes Historical Provider, MD  budesonide-formoterol (SYMBICORT) 160-4.5 MCG/ACT inhaler Inhale into the lungs. 08/13/14  Yes Historical Provider, MD  Diphenhydramine-APAP, sleep, (TYLENOL PM EXTRA STRENGTH PO) Take 1 tablet by mouth as needed.     Yes Historical Provider, MD  ibuprofen (ADVIL,MOTRIN) 200 MG tablet Take by mouth.   Yes Historical Provider, MD  levalbuterol [facility] HFA) 45 MCG/ACT inhaler Inhale 1-2 puffs into the lungs every 4 (four) hours as needed.   Yes Historical Provider, MD  levothyroxine (SYNTHROID, LEVOTHROID) 50 MCG tablet Take 1 tablet by mouth daily. 08/05/12  Yes Historical Provider, MD  Multiple Vitamins-Minerals (CENTRUM SILVER ADULT 50+ PO) Take 1 tablet by mouth daily.   Yes Historical Provider, MD  Na Sulfate-K Sulfate-Mg Sulf SOLN Take 1  kit by mouth once. suprep as directed. No substitutions. 04/10/15  Yes Irene Shipper, MD  Naproxen Sodium (ALEVE) 220 MG CAPS as needed.   Yes Historical Provider, MD  Oxymetazoline HCl (AFRIN NASAL SPRAY NA) Place into the nose as needed.     Yes Historical Provider, MD  Phenylephrine-APAP-Guaifenesin (MUCINEX SINUS-MAX CONGESTION) 5-325-200 MG TABS Per bottle directions as needed   Yes Historical Provider, MD  zolpidem (AMBIEN) 10 MG tablet Take 10 mg by mouth at bedtime as needed.   Yes Historical Provider, MD  DIPHENHYDRAMINE HCL PO Take 50 mg by mouth as needed.      Historical Provider, MD    Patient Active Problem List   Diagnosis Date Noted  . Arteriosclerosis of coronary artery 05/14/2015  . Allergic rhinitis 03/11/2015  . Airway hyperreactivity 03/11/2015  . Benign essential tremor 03/11/2015  . Benign neoplasm of colon 03/11/2015  . Failure of erection 03/11/2015  . Esophagitis, reflux 03/11/2015  . Essential (primary) hypertension 03/11/2015  . Gastro-esophageal reflux disease without esophagitis 03/11/2015  . HLD (hyperlipidemia) 03/11/2015  . Cannot sleep 03/11/2015  . Adult hypothyroidism 03/11/2015  . Chronic airway obstruction 03/11/2015  . Acid reflux 05/07/2014  . Cough 09/06/2012  . Shortness of breath 09/04/2012  . OSA (obstructive sleep apnea) 09/04/2012    Past Medical History  Diagnosis Date  . COPD (chronic obstructive pulmonary disease)   . GERD (gastroesophageal reflux disease)   . OSA (obstructive sleep apnea)     wears CPAP  . Benign neoplasm of colon   . Unspecified  sinusitis (chronic)   . ED (erectile dysfunction)   . Internal hemorrhoids without mention of complication   . Allergic rhinitis, cause unspecified   . Unspecified asthma(493.90)   . Hyperlipidemia   . Collapsed lung   . Essential (primary) hypertension 03/11/2015  . Testicular cancer 07/2014    History   Social History  . Marital Status: Married    Spouse Name: N/A  . Number  of Children: 2  . Years of Education: N/A   Occupational History  . retired     Architect   Social History Main Topics  . Smoking status: Never Smoker   . Smokeless tobacco: Never Used  . Alcohol Use: 4.2 oz/week    7 Standard drinks or equivalent per week     Comment: social  . Drug Use: No  . Sexual Activity: Not on file   Other Topics Concern  . Not on file   Social History Narrative    No Known Allergies  Review of Systems  Constitutional: Negative.   HENT: Negative.   Eyes: Negative.   Respiratory: Positive for shortness of breath (nothing knew pt has had this for a while).   Cardiovascular: Negative.   Gastrointestinal: Negative.   Genitourinary: Negative.   Musculoskeletal: Negative.   Skin: Negative.   Neurological: Negative.   Endo/Heme/Allergies: Negative.   Psychiatric/Behavioral: Negative.     Immunization History  Administered Date(s) Administered  . Influenza Whole 07/31/2012  . Pneumococcal Conjugate-13 08/04/2014  . Td 07/30/2004  . Tdap 08/04/2014   Objective:  BP 128/76 mmHg  Pulse 66  Temp(Src) 97.8 F (36.6 C) (Oral)  Resp 16  Wt 252 lb (114.306 kg)  Physical Exam  Constitutional: He is oriented to person, place, and time and well-developed, well-nourished, and in no distress.  HENT:  Head: Normocephalic and atraumatic.  Right Ear: External ear normal.  Left Ear: External ear normal.  Nose: Nose normal.  Eyes: Conjunctivae are normal.  Neck: Normal range of motion. Neck supple.  Cardiovascular: Normal rate, regular rhythm and normal heart sounds.   Pulmonary/Chest: Effort normal and breath sounds normal.  Abdominal: Soft. Bowel sounds are normal.  Neurological: He is alert and oriented to person, place, and time.  Skin: Skin is warm and dry.  Psychiatric: Mood, memory, affect and judgment normal.    Lab Results  Component Value Date   WBC 6.3 05/09/2014   HGB 15.0 05/09/2014   HCT 44 05/09/2014   PLT 228 05/09/2014    GLUCOSE 161* 06/26/2014   CHOL 131 05/09/2014   TRIG 163* 05/09/2014   HDL 42 05/09/2014   LDLCALC 56 05/09/2014   TSH 2.01 08/04/2014   PSA 0.5 05/09/2014    CMP     Component Value Date/Time   NA 142 11/13/2014   NA 141 06/26/2014 1355   K 4.3 11/13/2014   K 3.7 06/26/2014 1355   CL 106 06/26/2014 1355   CO2 27 06/26/2014 1355   GLUCOSE 161* 06/26/2014 1355   BUN 20 11/13/2014   BUN 16 06/26/2014 1355   CREATININE 0.9 11/13/2014   CREATININE 0.97 06/26/2014 1355   CALCIUM 8.4* 06/26/2014 1355   AST 22 11/13/2014   ALT 30 11/13/2014   ALKPHOS 60 11/13/2014   GFRNONAA >60 06/26/2014 1355   GFRAA >60 06/26/2014 1355    Assessment and Plan :   CAD without angina Followed by Dr. Nehemiah Massed.. All risk factors treated Hypertension Hyperlipidemia Mild obesity Patient has developed some truncal/abdominal fat which she does not  like. I think with his aging in mildly decreased testosterone this is normal. Advised him that I think regular weight loss exercise and dietary changes will help Status post removal of testicular cancer  Followed by Dr. Caryl Pina ED Rx for Viagra 100 mg daily when necessary #4 with a years worth a refill. Miguel Aschoff MD [facility] 05/14/2015 2:58 PM

## 2015-05-15 DIAGNOSIS — E785 Hyperlipidemia, unspecified: Secondary | ICD-10-CM | POA: Diagnosis not present

## 2015-05-15 DIAGNOSIS — E039 Hypothyroidism, unspecified: Secondary | ICD-10-CM | POA: Diagnosis not present

## 2015-05-15 DIAGNOSIS — I10 Essential (primary) hypertension: Secondary | ICD-10-CM | POA: Diagnosis not present

## 2015-05-16 LAB — CBC WITH DIFFERENTIAL/PLATELET
Basophils Absolute: 0 10*3/uL (ref 0.0–0.2)
Basos: 0 %
EOS (ABSOLUTE): 0.3 10*3/uL (ref 0.0–0.4)
Eos: 5 %
Hematocrit: 43.9 % (ref 37.5–51.0)
Hemoglobin: 15 g/dL (ref 12.6–17.7)
Immature Grans (Abs): 0 10*3/uL (ref 0.0–0.1)
Immature Granulocytes: 0 %
Lymphocytes Absolute: 1.4 10*3/uL (ref 0.7–3.1)
Lymphs: 22 %
MCH: 32 pg (ref 26.6–33.0)
MCHC: 34.2 g/dL (ref 31.5–35.7)
MCV: 94 fL (ref 79–97)
Monocytes Absolute: 0.8 10*3/uL (ref 0.1–0.9)
Monocytes: 13 %
Neutrophils Absolute: 3.7 10*3/uL (ref 1.4–7.0)
Neutrophils: 60 %
Platelets: 221 10*3/uL (ref 150–379)
RBC: 4.69 x10E6/uL (ref 4.14–5.80)
RDW: 13 % (ref 12.3–15.4)
WBC: 6.1 10*3/uL (ref 3.4–10.8)

## 2015-05-16 LAB — COMPREHENSIVE METABOLIC PANEL
ALT: 27 IU/L (ref 0–44)
AST: 18 IU/L (ref 0–40)
Albumin/Globulin Ratio: 2.4 (ref 1.1–2.5)
Albumin: 4.5 g/dL (ref 3.6–4.8)
Alkaline Phosphatase: 57 IU/L (ref 39–117)
BUN/Creatinine Ratio: 19 (ref 10–22)
BUN: 20 mg/dL (ref 8–27)
Bilirubin Total: 0.8 mg/dL (ref 0.0–1.2)
CO2: 25 mmol/L (ref 18–29)
Calcium: 9.2 mg/dL (ref 8.6–10.2)
Chloride: 99 mmol/L (ref 97–108)
Creatinine, Ser: 1.05 mg/dL (ref 0.76–1.27)
GFR calc Af Amer: 83 mL/min/{1.73_m2} (ref >59)
GFR calc non Af Amer: 72 mL/min/{1.73_m2} (ref >59)
Globulin, Total: 1.9 g/dL (ref 1.5–4.5)
Glucose: 98 mg/dL (ref 65–99)
Potassium: 4.8 mmol/L (ref 3.5–5.2)
Sodium: 141 mmol/L (ref 134–144)
Total Protein: 6.4 g/dL (ref 6.0–8.5)

## 2015-05-16 LAB — TSH: TSH: 3.36 u[IU]/mL (ref 0.450–4.500)

## 2015-05-16 LAB — LIPID PANEL WITH LDL/HDL RATIO
Cholesterol, Total: 140 mg/dL (ref 100–199)
HDL: 43 mg/dL (ref >39)
LDL Calculated: 63 mg/dL (ref 0–99)
LDl/HDL Ratio: 1.5 ratio units (ref 0.0–3.6)
Triglycerides: 168 mg/dL — ABNORMAL HIGH (ref 0–149)
VLDL Cholesterol Cal: 34 mg/dL (ref 5–40)

## 2015-05-18 ENCOUNTER — Telehealth: Payer: Self-pay | Admitting: Family Medicine

## 2015-05-18 NOTE — Progress Notes (Signed)
LMTCB  aa 

## 2015-05-18 NOTE — Progress Notes (Signed)
[id number] 

## 2015-05-21 ENCOUNTER — Ambulatory Visit
Admission: RE | Admit: 2015-05-21 | Discharge: 2015-05-21 | Disposition: A | Discharge status: Home / Self Care | Payer: Medicare Other | Source: Ambulatory Visit | Attending: Urology | Admitting: Urology

## 2015-05-21 DIAGNOSIS — Z9079 Acquired absence of other genital organ(s): Secondary | ICD-10-CM | POA: Insufficient documentation

## 2015-05-21 DIAGNOSIS — N200 Calculus of kidney: Secondary | ICD-10-CM | POA: Diagnosis not present

## 2015-05-21 DIAGNOSIS — R911 Solitary pulmonary nodule: Secondary | ICD-10-CM | POA: Insufficient documentation

## 2015-05-21 DIAGNOSIS — Z8547 Personal history of malignant neoplasm of testis: Secondary | ICD-10-CM | POA: Diagnosis not present

## 2015-05-21 DIAGNOSIS — C6290 Malignant neoplasm of unspecified testis, unspecified whether descended or undescended: Secondary | ICD-10-CM

## 2015-05-21 DIAGNOSIS — K760 Fatty (change of) liver, not elsewhere classified: Secondary | ICD-10-CM | POA: Diagnosis not present

## 2015-05-21 MED ORDER — IOHEXOL 350 MG/ML SOLN
100.0000 mL | Freq: Once | INTRAVENOUS | Status: AC | PRN
  Administered 2015-05-21: 100 mL via INTRAVENOUS

## 2015-05-22 ENCOUNTER — Encounter: Payer: Self-pay | Admitting: Urology

## 2015-05-22 ENCOUNTER — Ambulatory Visit (INDEPENDENT_AMBULATORY_CARE_PROVIDER_SITE_OTHER): Payer: Medicare Other | Admitting: Urology

## 2015-05-22 VITALS — BP 120/77 | HR 66 | Resp 18 | Ht 73.0 in | Wt 250.1 lb

## 2015-05-22 DIAGNOSIS — C6292 Malignant neoplasm of left testis, unspecified whether descended or undescended: Secondary | ICD-10-CM | POA: Diagnosis not present

## 2015-05-22 DIAGNOSIS — C6290 Malignant neoplasm of unspecified testis, unspecified whether descended or undescended: Secondary | ICD-10-CM | POA: Insufficient documentation

## 2015-05-22 DIAGNOSIS — R5382 Chronic fatigue, unspecified: Secondary | ICD-10-CM

## 2015-05-22 NOTE — Progress Notes (Signed)
05/22/2015 10:44 AM   Matthew [name] 1946-02-06 [id number]  Referring provider: Jerrol Banana., MD [address] [place], [place] [address]  Chief Complaint  Patient presents with  . 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  Pathology for left inguinal radical orchiectomy from 07/02/14 shows mixed germ cell tumor with 40% embryonal, 40% teratoma, 5% yolk sac, and 5% seminoma. Tumor confined to testicle without LVI or local invasion of tunica vaginalis. Tumor markers negative.  CT abd/ pelvis 07/08/14 showed no evidence of metastatic disease or lymphadenopathy. Incidental 4 mm RLP stone identified. CXR with R basilar atelectasis, no evidence of mets. Follow up imaging in 11/06/14 showed soft tissue prominence in the left inguinal region just distal to the left inguinal ring measuring 1.5 x 1.2 cm ? enlarging LN vs. post operative changes.   He continues to have [name] energy.  Most recent testosterone normal. He does state today that he snores at night and doesn't sleep well.  No weight loss or any other voiding complaints today.  He returns to the office today for a physical exam and review follow-up CT scan per active surveillance protocol.   PMH: Past Medical History  Diagnosis Date  . COPD (chronic obstructive pulmonary disease)   . GERD (gastroesophageal reflux disease)   . OSA (obstructive sleep apnea)     wears CPAP  . Benign neoplasm of colon   . Unspecified sinusitis (chronic)   . ED (erectile dysfunction)   . Internal hemorrhoids without mention of complication   . Allergic rhinitis, cause unspecified   . Unspecified asthma(493.90)   . Hyperlipidemia   . Collapsed lung   . Testicular cancer 07/2014    Surgical History: Past Surgical History  Procedure Laterality Date  . Colonoscopy    . Polypectomy    . Inguinal hernia repair    . Sinus surgery with  instatrak    . Nose surgery      submucous surgery  . Subclavian bypass graft  2009  . Testicle removal    . Dental surgery  12/2014    Home Medications:    Medication List       This list is accurate as of: 05/22/15 11:59 PM.  Always use your most recent med list.               [place] NA  Place into the nose as needed.     ALEVE 220 MG Caps  Generic drug:  Naproxen Sodium  as needed.     aspirin 81 MG tablet  Take 81 mg by mouth daily.     atorvastatin 20 MG tablet  Commonly known as:  LIPITOR  Take by mouth.     budesonide-formoterol 160-4.5 MCG/ACT inhaler  Commonly known as:  SYMBICORT  Inhale into the lungs.     CENTRUM SILVER ADULT 50+ PO  Take 1 tablet by mouth daily.     DIPHENHYDRAMINE HCL PO  Take 50 mg by mouth as needed.     folic acid 948 MCG tablet  Commonly known as:  FOLVITE  Take 400 mcg by mouth daily.     ibuprofen 200 MG tablet  Commonly known as:  ADVIL,MOTRIN  Take by mouth.     levalbuterol 45 MCG/ACT inhaler  Commonly known as:  XOPENEX HFA  Inhale 1-2 puffs into the lungs every 4 (  four) hours as needed.     levothyroxine 50 MCG tablet  Commonly known as:  SYNTHROID, LEVOTHROID  Take 1 tablet by mouth daily.     MUCINEX SINUS-MAX CONGESTION 5-325-200 MG Tabs  Generic drug:  Phenylephrine-APAP-Guaifenesin  Per bottle directions as needed     Na Sulfate-K Sulfate-Mg Sulf Soln  Take 1 kit by mouth once. suprep as directed. No substitutions.     Potassium Acetate Powd  1 tablet by Does not apply route.     sildenafil 100 MG tablet  Commonly known as:  VIAGRA  Take 1 tablet (100 mg total) by mouth daily as needed for erectile dysfunction.     TYLENOL PM EXTRA STRENGTH PO  Take 1 tablet by mouth as needed.     zolpidem 10 MG tablet  Commonly known as:  AMBIEN  Take 10 mg by mouth at bedtime as needed.        Allergies: No Known Allergies  Family History: Family History  Problem Relation Age of Onset  .  Colon cancer Sister   . Heart attack Sister   . Breast cancer Sister   . Esophageal cancer Neg Hx   . Stomach cancer Neg Hx   . Rectal cancer Neg Hx   . Breast cancer Sister   . Heart attack Sister     deceased from MI  . Allergies Father   . Heart attack Father   . CAD Father     deceased 79 from fall and possible subdural hematoma  . Congestive Heart Failure Mother   . Emphysema Mother     deceased 101 with emphysema, tobacco abuse  . Asthma Sister   . CAD Brother   . Stroke Brother   . Lung cancer Brother   . Arthritis Sister     Social History:  reports that he has never smoked. He has never used smokeless tobacco. He reports that he drinks about 4.2 oz of alcohol per week. He reports that he does not use illicit drugs.  ROS: UROLOGY Frequent Urination?: No Hard to postpone urination?: No Burning/pain with urination?: No Get up at night to urinate?: No Leakage of urine?: No Urine stream starts and stops?: No Trouble starting stream?: No Do you have to strain to urinate?: No Blood in urine?: No Urinary tract infection?: No Sexually transmitted disease?: No Injury to kidneys or bladder?: No Painful intercourse?: No Weak stream?: No Erection problems?: Yes Penile pain?: No  Gastrointestinal Nausea?: No Vomiting?: No Indigestion/heartburn?: No Diarrhea?: No Constipation?: No  Constitutional Fever: No Night sweats?: No Weight loss?: No Fatigue?: Yes  Skin Skin rash/lesions?: No Itching?: No  Eyes Blurred vision?: No Double vision?: No  Ears/Nose/Throat Sore throat?: No Sinus problems?: Yes  Hematologic/Lymphatic Swollen glands?: No Easy bruising?: No  Cardiovascular Leg swelling?: No Chest pain?: No  Respiratory Cough?: Yes Shortness of breath?: Yes  Endocrine Excessive thirst?: No  Musculoskeletal Back pain?: No Joint pain?: No  Neurological Headaches?: Yes Dizziness?: No  Psychologic Depression?: No Anxiety?: No  Physical  Exam: BP 120/77 mmHg  Pulse 66  Resp 18  Ht $R'6\' 1"'JZ$  (1.854 m)  Wt 250 lb 1.6 oz (113.445 kg)  BMI 33.00 kg/m2  Constitutional:  Alert and oriented, No acute distress. HEENT: [place] AT, moist mucus membranes.  Trachea midline, no masses. Cardiovascular: No clubbing, cyanosis, or edema. Respiratory: Normal respiratory effort, no increased work of breathing. GI: Abdomen is soft, nontender, nondistended, no abdominal masses GU: scrotum unremarkable with solitary testis, no palpable tumors.  Multiple phallus with orthotopic meatus. Left inguinal area well-healed scar without any palpable adenopathy. Skin: No rashes, bruises or suspicious lesions. Lymph: No cervical or inguinal adenopathy. Neurologic: Grossly intact, no focal deficits, moving all 4 extremities. Psychiatric: Normal mood and affect.  Laboratory Data: Lab Results  Component Value Date   WBC 6.1 05/15/2015   HGB 15.0 05/09/2014   HCT 43.9 05/15/2015   PLT 228 05/09/2014    Lab Results  Component Value Date   CREATININE 1.05 05/15/2015    Lab Results  Component Value Date   PSA 0.5 05/09/2014    Urinalysis Results for orders placed or performed in visit on 05/22/15  Lactate dehydrogenase  Result Value Ref Range   LDH 159 121 - 224 IU/L  AFP tumor marker  Result Value Ref Range   AFP-Tumor Marker 5.2 0.0 - 8.3 ng/mL  Beta HCG, Quant (tumor marker)  Result Value Ref Range   hCG Quant <1 0 - 3 mIU/mL    Pertinent Imaging: CLINICAL DATA: History of testicular cancer. Diagnosed 1 year ago with surgery only.  EXAM: CT ABDOMEN AND PELVIS WITHOUT AND WITH CONTRAST  TECHNIQUE: Multidetector CT imaging of the abdomen and pelvis was performed following the standard protocol before and following the bolus administration of intravenous contrast.  CONTRAST: 146mL OMNIPAQUE IOHEXOL 350 MG/ML SOLN  COMPARISON: 11/06/2014  FINDINGS: Lower chest: Right hemidiaphragm elevation with right lung base volume loss.  Similar subpleural 1 mm right upper lobe pulmonary nodule on image 8. Normal heart size without pericardial or pleural effusion. Multivessel coronary artery atherosclerosis.  Hepatobiliary: Mild hepatic steatosis, without focal liver lesion. Normal gallbladder, without biliary ductal dilatation.  Pancreas: Normal, without mass or ductal dilatation.  Spleen: Normal  Adrenals/Urinary Tract: Normal adrenal glands. Bilateral punctate renal collecting system calculi. Right renal scarring. No hydronephrosis. No hydroureter or ureteric calculi. No bladder calculi. A tiny interpolar left renal lesion is too small to characterize. No suspicious renal lesion. Normal urinary bladder.  Stomach/Bowel: Normal stomach, without wall thickening. Normal terminal ileum and appendix. Normal small bowel.  Vascular/Lymphatic: Aortic and branch vessel atherosclerosis. Accessory lower pole right renal artery. No abdominopelvic adenopathy.  Reproductive: Normal prostate. Left sided orchectomy. Soft tissue density within the left inguinal canal measures 1.4 x 1.3 cm on image 97. Compare 1.5 x 1.2 cm on the prior exam. Minimal improvement in surrounding interstitial thickening.  Other: No significant free fluid. Small fat containing right inguinal hernia.  Musculoskeletal: Left sided degenerative partial sacroiliac joint fusion.  IMPRESSION: 1. Status post left orchiectomy, without metastatic disease. 2. Similar soft tissue density within the left inguinal canal is favored to be postoperative scarring. Recommend attention on follow-up. 3. Bilateral nephrolithiasis. 4. A subpleural 1 mm nodule in the right lung base is likely a subpleural lymph node in unchanged.   Electronically Signed  By: Abigail Miyamoto M.D.  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 This involves  tumor markers/ exam q2 months, CT scan q4-6 months and CXR at 4 and 12 months in the first year, with increasing interval thereafter. Interval CT scan in 10/2014 shows some soft tissue irregularity measuring 1.5 cm just distal to the left ring.  Follow up CT scan stable, no changes today. Tumor markers remain negative.  1. Non-seminomatous testicular cancer, left Plan for Follow-up in 6 months with CT abdomen pelvis with contrast/ chest x-ray/ tumor markers per surveillance protocol - CT  Abdomen Pelvis W Wo Contrast; Future - Chest 1 View; Future - Lactate dehydrogenase - AFP tumor marker - Beta HCG, Quant (tumor marker)  2. Chronic fatigue Most recent testosterone normal, do not suspect hypogonadism. Fatigue likely old type factorial possibly including depression and/or sleep disturbance/OSA. I have recommended following up with his PCP.   Return in about 6 months (around 11/22/2015) for CT abd/ pelvis, CXR and tumor markers.  Hollice Espy, MD  [facility] [address], [place], [place] [address] [phone]

## 2015-05-23 ENCOUNTER — Encounter: Payer: Self-pay | Admitting: Urology

## 2015-05-23 LAB — LACTATE DEHYDROGENASE: LDH: 159 IU/L (ref 121–224)

## 2015-05-23 LAB — BETA HCG QUANT (REF LAB): hCG Quant: <1 m[IU]/mL (ref 0–3)

## 2015-05-23 LAB — AFP TUMOR MARKER: AFP-Tumor Marker: 5.2 ng/mL (ref 0.0–8.3)

## 2015-05-25 ENCOUNTER — Encounter: Payer: Self-pay | Admitting: Internal Medicine

## 2015-05-25 ENCOUNTER — Ambulatory Visit (AMBULATORY_SURGERY_CENTER): Payer: Medicare Other | Admitting: Internal Medicine

## 2015-05-25 VITALS — BP 137/89 | HR 57 | Temp 96.7°F | Resp 11 | Ht 73.0 in | Wt 250.0 lb

## 2015-05-25 DIAGNOSIS — Z860100 Personal history of colon polyps, unspecified: Secondary | ICD-10-CM

## 2015-05-25 DIAGNOSIS — Z800 Family history of malignant neoplasm of digestive organs: Secondary | ICD-10-CM | POA: Diagnosis not present

## 2015-05-25 DIAGNOSIS — K635 Polyp of colon: Secondary | ICD-10-CM

## 2015-05-25 DIAGNOSIS — D125 Benign neoplasm of sigmoid colon: Secondary | ICD-10-CM | POA: Diagnosis not present

## 2015-05-25 MED ORDER — SODIUM CHLORIDE 0.9 % IV SOLN: 500.0000 mL | INTRAVENOUS | Status: DC

## 2015-05-25 NOTE — Progress Notes (Signed)
A/ox3, pleased with MAC, report to RN 

## 2015-05-25 NOTE — Patient Instructions (Signed)
Colon polyp x 1 removed, diverticulosis seen. Handouts given on polyps and diverticulosis and high fiber diet.  Repeat colonoscopy in 2 years.   YOU HAD AN ENDOSCOPIC PROCEDURE TODAY AT [facility]:   Refer to the procedure report that was given to you for any specific questions about what was found during the examination.  If the procedure report does not answer your questions, please call your gastroenterologist to clarify.  If you requested that your care partner not be given the details of your procedure findings, then the procedure report has been included in a sealed envelope for you to review at your convenience later.  YOU SHOULD EXPECT: Some feelings of bloating in the abdomen. Passage of more gas than usual.  Walking can help get rid of the air that was put into your GI tract during the procedure and reduce the bloating. If you had a lower endoscopy (such as a colonoscopy or flexible sigmoidoscopy) you may notice spotting of blood in your stool or on the toilet paper. If you underwent a bowel prep for your procedure, you may not have a normal bowel movement for a few days.  Please Note:  You might notice some irritation and congestion in your nose or some drainage.  This is from the oxygen used during your procedure.  There is no need for concern and it should clear up in a day or so.  SYMPTOMS TO REPORT IMMEDIATELY:   Following lower endoscopy (colonoscopy or flexible sigmoidoscopy):  Excessive amounts of blood in the stool  Significant tenderness or worsening of abdominal pains  Swelling of the abdomen that is new, acute  Fever of 100F or higher   For urgent or emergent issues, a gastroenterologist can be reached at any hour by calling [phone].   DIET: Your first meal following the procedure should be a small meal and then it is ok to progress to your normal diet. Heavy or fried foods are harder to digest and may make you feel nauseous or bloated.   Likewise, meals heavy in dairy and vegetables can increase bloating.  Drink plenty of fluids but you should avoid alcoholic beverages for 24 hours.  ACTIVITY:  You should plan to take it easy for the rest of today and you should NOT DRIVE or use heavy machinery until tomorrow (because of the sedation medicines used during the test).    FOLLOW UP: Our staff will call the number listed on your records the next business day following your procedure to check on you and address any questions or concerns that you may have regarding the information given to you following your procedure. If we do not reach you, we will leave a message.  However, if you are feeling well and you are not experiencing any problems, there is no need to return our call.  We will assume that you have returned to your regular daily activities without incident.  If any biopsies were taken you will be contacted by phone or by letter within the next 1-3 weeks.  Please call us at [phone] if you have not heard about the biopsies in 3 weeks.    SIGNATURES/CONFIDENTIALITY: You and/or your care partner have signed paperwork which will be entered into your electronic medical record.  These signatures attest to the fact that that the information above on your After Visit Summary has been reviewed and is understood.  Full responsibility of the confidentiality of this discharge information lies with you and/or your care-partner.

## 2015-05-25 NOTE — Progress Notes (Signed)
Called to room to assist during endoscopic procedure.  Patient ID and intended procedure confirmed with present staff. Received instructions for my participation in the procedure from the performing physician.Called to room to assist during endoscopic procedure.  Patient ID and intended procedure confirmed with present staff. Received instructions for my participation in the procedure from the performing physician. 

## 2015-05-25 NOTE — Op Note (Signed)
[name], [address]   COLONOSCOPY PROCEDURE REPORT  PATIENT: Matthew, [name]  MR#: [id number] BIRTHDATE: 05-20-1946 , 69  yrs. old GENDER: male ENDOSCOPIST: Eustace Quail, MD REFERRED BP:[id number] Program Recall PROCEDURE DATE:  05/25/2015 PROCEDURE:   Colonoscopy, surveillance and Colonoscopy with snare polypectomy x 1 First Screening Colonoscopy - Avg.  risk and is 50 yrs.  old or older - No.  Prior Negative Screening - Now for repeat screening. N/A  History of Adenoma - Now for follow-up colonoscopy & has been > or = to 3 yrs.  Yes hx of adenoma.  Has been 3 or more years since last colonoscopy.  Polyps removed today? Yes ASA CLASS:   Class II INDICATIONS:Surveillance due to prior colonic neoplasia, PH Colon or Rectal Adenocarcinoma, and PH Colon Adenoma.  . Patient with a history of multiple and advanced polyps. Index exam 2002 with tubulovillous adenoma. Follow-up examination 2003, 2006 (malignant colon polyp), 2008, 2010 (9 polyps), 2011, 2012, 2014. Multiple adenomatous. Sister with colorectal cancer MEDICATIONS: Monitored anesthesia care and Propofol 200 mg IV  DESCRIPTION OF PROCEDURE:   After the risks benefits and alternatives of the procedure were thoroughly explained, informed consent was obtained.  The digital rectal exam revealed no abnormalities of the rectum.   The LB [id number]  endoscope was introduced through the anus and advanced to the cecum, which was identified by both the appendix and ileocecal valve. No adverse events experienced.   The quality of the prep was excellent. (Suprep was used)  The instrument was then slowly withdrawn as the colon was fully examined. Estimated blood loss is zero unless otherwise noted in this procedure report.  COLON FINDINGS: A single polyp measuring 4 mm in size was found in the sigmoid colon.  A polypectomy was performed with a cold snare. The resection was  complete, the polyp tissue was completely retrieved and sent to histology.   There was moderate diverticulosis noted in the sigmoid colon.   The examination was otherwise normal.  Retroflexed views revealed internal hemorrhoids. The time to cecum = 1.2 Withdrawal time = 8.3   The scope was withdrawn and the procedure completed. COMPLICATIONS: There were no immediate complications.  ENDOSCOPIC IMPRESSION: 1.   Single polyp was found in the sigmoid colon; polypectomy was performed with a cold snare 2.   Moderate diverticulosis was noted in the sigmoid colon 3.   The examination was otherwise normal  RECOMMENDATIONS: 1. Repeat Colonoscopy in 2 years.  eSigned:  Eustace Quail, MD 05/25/2015 2:14 PM   cc: The Patient and Miguel Aschoff, MD

## 2015-05-26 ENCOUNTER — Telehealth: Payer: Self-pay | Admitting: *Deleted

## 2015-05-26 NOTE — Telephone Encounter (Signed)
  Follow up Call-  Call back number 05/25/2015 11/13/2012  Post procedure Call Back phone  # [phone]  Permission to leave phone message Yes Yes     Patient questions:  Do you have a fever, pain , or abdominal swelling? No. Pain Score  0 *  Have you tolerated food without any problems? Yes.    Have you been able to return to your normal activities? Yes.    Do you have any questions about your discharge instructions: Diet   No. Medications  No. Follow up visit  No.  Do you have questions or concerns about your Care? No.  Actions: * If pain score is 4 or above: No action needed, pain <4.

## 2015-05-29 ENCOUNTER — Encounter: Payer: Self-pay | Admitting: Internal Medicine

## 2015-06-10 ENCOUNTER — Other Ambulatory Visit: Payer: Self-pay | Admitting: Family Medicine

## 2015-09-07 ENCOUNTER — Other Ambulatory Visit: Payer: Self-pay | Admitting: Family Medicine

## 2015-11-03 ENCOUNTER — Encounter: Payer: Medicare Other | Admitting: Family Medicine

## 2015-11-10 ENCOUNTER — Ambulatory Visit (INDEPENDENT_AMBULATORY_CARE_PROVIDER_SITE_OTHER): Payer: Medicare Other | Admitting: Urology

## 2015-11-10 ENCOUNTER — Ambulatory Visit: Payer: Medicare Other | Admitting: Urology

## 2015-11-10 ENCOUNTER — Encounter: Payer: Self-pay | Admitting: Urology

## 2015-11-10 VITALS — BP 138/85 | HR 80 | Ht 73.0 in | Wt 253.6 lb

## 2015-11-10 DIAGNOSIS — C6292 Malignant neoplasm of left testis, unspecified whether descended or undescended: Secondary | ICD-10-CM | POA: Diagnosis not present

## 2015-11-10 NOTE — Progress Notes (Signed)
2:43 PM  11/10/2015   Matthew [name] [birth year] [id number]  Referring provider: Jerrol Banana., MD [address] [place], [place] [address]  Chief Complaint  Patient presents with  . 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  He returns to the office for routine follow-up. In the interim, he's had no issues. He does continue to have baseline energy and a chronic cough, otherwise no changes in his medical status.  Pathology for left inguinal radical orchiectomy from 07/02/14 shows mixed germ cell tumor with 40% embryonal, 40% teratoma, 5% yolk sac, and 5% seminoma. Tumor confined to testicle without LVI or local invasion of tunica vaginalis. Tumor markers negative.  CT abd/ pelvis 07/08/14 showed no evidence of metastatic disease or lymphadenopathy. Incidental 4 mm RLP stone identified. CXR with R basilar atelectasis, no evidence of mets. Follow up imaging in 11/06/14 showed soft tissue prominence in the left inguinal region just distal to the left inguinal ring measuring 1.5 x 1.2 cm ? enlarging LN vs. post operative changes. The CT and 05/2015 showed stable soft tissue changes left inguinal canal most likely representing postoperative scarring.   PMH: Past Medical History  Diagnosis Date  . COPD (chronic obstructive pulmonary disease) ([place]   . GERD (gastroesophageal reflux disease)   . OSA (obstructive sleep apnea)     wears CPAP  . Benign neoplasm of colon   . Unspecified sinusitis (chronic)   . ED (erectile dysfunction)   . Internal hemorrhoids without mention of complication   . Allergic rhinitis, cause unspecified   . Unspecified asthma(493.90)   . Hyperlipidemia   . Collapsed lung   . Testicular cancer ([facility] 07/2014  . Thyroid disease   . Sleep apnea     Surgical History: Past Surgical History  Procedure Laterality Date  .  Colonoscopy    . Polypectomy    . Inguinal hernia repair    . Sinus surgery with instatrak    . Nose surgery      submucous surgery  . Subclavian bypass graft  2009  . Testicle removal    . Dental surgery  12/2014    Home Medications:    Medication List       This list is accurate as of: 11/10/15  2:43 PM.  Always use your most recent med list.               ALaneNA  Place into the nose as needed.     ALEVE 220 MG Caps  Generic drug:  Naproxen Sodium  as needed.     aspirin 81 MG tablet  Take 81 mg by mouth daily.     atorvastatin 20 MG tablet  Commonly known as:  LIPITOR  Take by mouth.     CENTRUM SILVER ADULT 50+ PO  Take 1 tablet by mouth daily.     DIPHENHYDRAMINE HCL PO  Take 50 mg by mouth as needed.     folic acid 4544MCG tablet  Commonly known as:  FOLVITE  Take 400 mcg by mouth daily.     ibuprofen 200 MG tablet  Commonly known as:  ADVIL,MOTRIN  Take by mouth.     levalbuterol 45 MCG/ACT inhaler  Commonly known as:  XOPENEX HFA  Inhale 1-2 puffs into the lungs every 4 (four) hours as needed.  levothyroxine 75 MCG tablet  Commonly known as:  SYNTHROID, LEVOTHROID  TAKE 1 TABLET BY MOUTH EVERY DAY     MUCINEX SINUS-MAX CONGESTION 5-325-200 MG Tabs  Generic drug:  Phenylephrine-APAP-Guaifenesin  Per bottle directions as needed     Na Sulfate-K Sulfate-Mg Sulf Soln  Take 1 kit by mouth once. suprep as directed. No substitutions.     Potassium Acetate Powd  1 tablet by Does not apply route.     sildenafil 100 MG tablet  Commonly known as:  VIAGRA  Take 1 tablet (100 mg total) by mouth daily as needed for erectile dysfunction.     SYMBICORT 160-4.5 MCG/ACT inhaler  Generic drug:  budesonide-formoterol  INHALE 1-2 PUFFS TWICE A DAY     TYLENOL PM EXTRA STRENGTH PO  Take 1 tablet by mouth as needed.        Allergies: No Known Allergies  Family History: Family History  Problem Relation Age of Onset  . Colon cancer  Sister   . Heart attack Sister   . Breast cancer Sister   . Esophageal cancer Neg Hx   . Stomach cancer Neg Hx   . Rectal cancer Neg Hx   . Breast cancer Sister   . Heart attack Sister     deceased from MI  . Allergies Father   . Heart attack Father   . CAD Father     deceased 42 from fall and possible subdural hematoma  . Congestive Heart Failure Mother   . Emphysema Mother     deceased 69 with emphysema, tobacco abuse  . Asthma Sister   . CAD Brother   . Stroke Brother   . Lung cancer Brother   . Arthritis Sister     Social History:  reports that he has never smoked. He has never used smokeless tobacco. He reports that he drinks about 4.2 oz of alcohol per week. He reports that he does not use illicit drugs.  ROS: UROLOGY Frequent Urination?: No Hard to postpone urination?: No Burning/pain with urination?: No Get up at night to urinate?: No Leakage of urine?: No Urine stream starts and stops?: No Trouble starting stream?: No Do you have to strain to urinate?: No Blood in urine?: No Urinary tract infection?: No Sexually transmitted disease?: No Injury to kidneys or bladder?: No Painful intercourse?: No Weak stream?: No Erection problems?: No Penile pain?: No  Gastrointestinal Nausea?: No Vomiting?: No Indigestion/heartburn?: No Diarrhea?: No Constipation?: No  Constitutional Fever: No Night sweats?: No Weight loss?: No Fatigue?: Yes  Skin Skin rash/lesions?: No Itching?: No  Eyes Blurred vision?: No Double vision?: No  Ears/Nose/Throat Sore throat?: No Sinus problems?: No  Hematologic/Lymphatic Swollen glands?: No Easy bruising?: No  Cardiovascular Leg swelling?: No Chest pain?: No  Respiratory Cough?: Yes Shortness of breath?: Yes  Endocrine Excessive thirst?: No  Musculoskeletal Back pain?: No Joint pain?: No  Neurological Headaches?: No Dizziness?: No  Psychologic Depression?: No Anxiety?: No  Physical Exam: BP  138/85 mmHg  Pulse 80  Ht _0  (1.854 m)  Wt 253 lb 9.6 oz (115.032 kg)  BMI 33.47 kg/m2  Constitutional:  Alert and oriented, No acute distress. HEENT: [place] AT, moist mucus membranes.  Trachea midline, no masses. Cardiovascular: No clubbing, cyanosis, or edema. Respiratory: Normal respiratory effort, no increased work of breathing. GI: Abdomen is soft, nontender, nondistended, no abdominal masses GU: scrotum unremarkable with solitary testis, no palpable tumors. Multiple phallus with orthotopic meatus. Left inguinal area well-healed scar without any palpable adenopathy.  Skin: No rashes, bruises or suspicious lesions. Lymph: No cervical or inguinal adenopathy. Neurologic: Grossly intact, no focal deficits, moving all 4 extremities. Psychiatric: Normal mood and affect.  Laboratory Data: Lab Results  Component Value Date   WBC 6.1 05/15/2015   HGB 15.0 05/09/2014   HCT 43.9 05/15/2015   MCV 94 05/15/2015   PLT 221 05/15/2015    Lab Results  Component Value Date   CREATININE 1.05 05/15/2015    Pertinent Imaging: CLINICAL DATA: History of testicular cancer. Diagnosed 1 year ago with surgery only.  EXAM: CT ABDOMEN AND PELVIS WITHOUT AND WITH CONTRAST  TECHNIQUE: Multidetector CT imaging of the abdomen and pelvis was performed following the standard protocol before and following the bolus administration of intravenous contrast.  CONTRAST: 134m OMNIPAQUE IOHEXOL 350 MG/ML SOLN  COMPARISON: 11/06/2014  FINDINGS: Lower chest: Right hemidiaphragm elevation with right lung base volume loss. Similar subpleural 1 mm right upper lobe pulmonary nodule on image 8. Normal heart size without pericardial or pleural effusion. Multivessel coronary artery atherosclerosis.  Hepatobiliary: Mild hepatic steatosis, without focal liver lesion. Normal gallbladder, without biliary ductal dilatation.  Pancreas: Normal, without mass or ductal dilatation.  Spleen:  Normal  Adrenals/Urinary Tract: Normal adrenal glands. Bilateral punctate renal collecting system calculi. Right renal scarring. No hydronephrosis. No hydroureter or ureteric calculi. No bladder calculi. A tiny interpolar left renal lesion is too small to characterize. No suspicious renal lesion. Normal urinary bladder.  Stomach/Bowel: Normal stomach, without wall thickening. Normal terminal ileum and appendix. Normal small bowel.  Vascular/Lymphatic: Aortic and branch vessel atherosclerosis. Accessory lower pole right renal artery. No abdominopelvic adenopathy.  Reproductive: Normal prostate. Left sided orchectomy. Soft tissue density within the left inguinal canal measures 1.4 x 1.3 cm on image 97. Compare 1.5 x 1.2 cm on the prior exam. Minimal improvement in surrounding interstitial thickening.  Other: No significant free fluid. Small fat containing right inguinal hernia.  Musculoskeletal: Left sided degenerative partial sacroiliac joint fusion.  IMPRESSION: 1. Status post left orchiectomy, without metastatic disease. 2. Similar soft tissue density within the left inguinal canal is favored to be postoperative scarring. Recommend attention on follow-up. 3. Bilateral nephrolithiasis. 4. A subpleural 1 mm nodule in the right lung base is likely a subpleural lymph node in unchanged.   Electronically Signed  By: KAbigail MiyamotoM.D.  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  Per NCCN and guidelines,  imaging may be spread out to CT scan every 6 months - 1 year with chest x-ray annually. We will plan for getting a CT scan/ CXR in the near future and then annually thereafter.  1. Non-seminomatous testicular cancer, left - CT Abdomen Pelvis W Wo Contrast; Future - Chest 1 View; Future - Lactate dehydrogenase - AFP tumor marker - Beta HCG, Quant (tumor  marker)   Return in about 9 months (around 08/09/2016) for testicular cancer f/u. (will arrange for annual imaging at that appt)  AHollice Espy MD  [address] SRepublicBSackets Harbor [place] [phone]

## 2015-11-11 LAB — BETA HCG QUANT (REF LAB): hCG Quant: <1 m[IU]/mL (ref 0–3)

## 2015-11-11 LAB — AFP TUMOR MARKER: AFP-Tumor Marker: 6 ng/mL (ref 0.0–8.3)

## 2015-11-11 LAB — LACTATE DEHYDROGENASE: LDH: 171 IU/L (ref 121–224)

## 2015-11-27 ENCOUNTER — Telehealth: Payer: Self-pay | Admitting: Family Medicine

## 2016-01-30 ENCOUNTER — Other Ambulatory Visit: Payer: Self-pay | Admitting: Family Medicine

## 2016-02-17 ENCOUNTER — Encounter: Payer: Self-pay | Admitting: Family Medicine

## 2016-02-17 ENCOUNTER — Ambulatory Visit (INDEPENDENT_AMBULATORY_CARE_PROVIDER_SITE_OTHER): Payer: Medicare Other | Admitting: Family Medicine

## 2016-02-17 VITALS — BP 128/76 | HR 76 | Temp 98.7°F | Resp 14 | Ht 73.0 in | Wt 259.0 lb

## 2016-02-17 DIAGNOSIS — R5383 Other fatigue: Secondary | ICD-10-CM

## 2016-02-17 DIAGNOSIS — Z1159 Encounter for screening for other viral diseases: Secondary | ICD-10-CM | POA: Diagnosis not present

## 2016-02-17 DIAGNOSIS — J309 Allergic rhinitis, unspecified: Secondary | ICD-10-CM

## 2016-02-17 DIAGNOSIS — Z0000 Encounter for general adult medical examination without abnormal findings: Secondary | ICD-10-CM | POA: Diagnosis not present

## 2016-02-17 DIAGNOSIS — G4733 Obstructive sleep apnea (adult) (pediatric): Secondary | ICD-10-CM | POA: Diagnosis not present

## 2016-02-17 MED ORDER — LORATADINE 10 MG PO TABS: 10.0000 mg | ORAL_TABLET | Freq: Every day | ORAL | Status: DC

## 2016-02-17 NOTE — Progress Notes (Signed)
Patient ID: Matthew [name], male   DOB: 02/17/1946, 70 y.o.   MRN: [id number] Visit Date: 02/17/2016  Today's Provider: Megan Mans, MD   No chief complaint on file.  Subjective:   Matthew [name] is a 70 y.o. male who presents today for his Subsequent Annual Wellness Visit. He feels fairly well. He reports he is not exercising. He reports he is sleeping poorly.  He reports that he has been sleeping a lot the last 3 days and does not have the energy to do anything.this is the only complaint. He just feels fatigued compared to previous years. This is been a slow change over time.  Review of Systems  Constitutional: Positive for fatigue and unexpected weight change.  HENT: Positive for hearing loss, sinus pressure, sneezing and tinnitus.   Eyes: Positive for photophobia, discharge and itching.  Respiratory: Positive for apnea, cough and shortness of breath.   Cardiovascular: Negative.   Gastrointestinal: Positive for constipation and abdominal distention.  Endocrine: Negative.   Genitourinary: Negative.   Musculoskeletal: Positive for back pain.  Skin: Negative.   Allergic/Immunologic: Negative.   Neurological: Positive for dizziness, tremors, weakness and light-headedness.  Hematological: Negative.   Psychiatric/Behavioral: Positive for sleep disturbance.    Patient Active Problem List   Diagnosis Date Noted  . Non-seminomatous testicular cancer (HCC) 05/22/2015  . Arteriosclerosis of coronary artery 05/14/2015  . Allergic rhinitis 03/11/2015  . Airway hyperreactivity 03/11/2015  . Benign essential tremor 03/11/2015  . Benign neoplasm of colon 03/11/2015  . Failure of erection 03/11/2015  . Esophagitis, reflux 03/11/2015  . Essential (primary) hypertension 03/11/2015  . Gastro-esophageal reflux disease without esophagitis 03/11/2015  . HLD (hyperlipidemia) 03/11/2015  . Cannot sleep 03/11/2015  . Adult hypothyroidism 03/11/2015  . Chronic airway obstruction (HCC)  03/11/2015  . Acid reflux 05/07/2014  . Cough 09/06/2012  . Shortness of breath 09/04/2012  . OSA (obstructive sleep apnea) 09/04/2012    Social History   Social History  . Marital Status: Married    Spouse Name: N/A  . Number of Children: 2  . Years of Education: N/A   Occupational History  . retired     Holiday representative   Social History Main Topics  . Smoking status: Never Smoker   . Smokeless tobacco: Never Used  . Alcohol Use: 4.2 oz/week    7 Standard drinks or equivalent per week     Comment: social  . Drug Use: No  . Sexual Activity: Not on file   Other Topics Concern  . Not on file   Social History Narrative    Past Surgical History  Procedure Laterality Date  . Colonoscopy    . Polypectomy    . Inguinal hernia repair    . Sinus surgery with instatrak    . Nose surgery      submucous surgery  . Subclavian bypass graft  2009  . Testicle removal    . Dental surgery  12/2014    His family history includes Allergies in his father; Arthritis in his sister; Asthma in his sister; Breast cancer in his sister and sister; CAD in his brother and father; Colon cancer in his sister; Congestive Heart Failure in his mother; Emphysema in his mother; Heart attack in his father, sister, and sister; Lung cancer in his brother; Stroke in his brother. There is no history of Esophageal cancer, Stomach cancer, or Rectal cancer.    Outpatient Prescriptions Prior to Visit  Medication Sig Dispense Refill  . aspirin 81 MG  tablet Take 81 mg by mouth daily.    Marland Kitchen atorvastatin (LIPITOR) 20 MG tablet Take by mouth.    Marland Kitchen DIPHENHYDRAMINE HCL PO Take 50 mg by mouth as needed.      . Diphenhydramine-APAP, sleep, (TYLENOL PM EXTRA STRENGTH PO) Take 1 tablet by mouth as needed.      . folic acid (FOLVITE) 619 MCG tablet Take 400 mcg by mouth daily.    Marland Kitchen ibuprofen (ADVIL,MOTRIN) 200 MG tablet Take by mouth.    . levalbuterol (XOPENEX HFA) 45 MCG/ACT inhaler Inhale 1-2 puffs into the lungs every  4 (four) hours as needed.    Marland Kitchen levothyroxine (SYNTHROID, LEVOTHROID) 75 MCG tablet TAKE 1 TABLET BY MOUTH EVERY DAY 30 tablet 12  . Multiple Vitamins-Minerals (CENTRUM SILVER ADULT 50+ PO) Take 1 tablet by mouth daily.    . Na Sulfate-K Sulfate-Mg Sulf SOLN Take 1 kit by mouth once. suprep as directed. No substitutions. 354 mL 0  . Naproxen Sodium (ALEVE) 220 MG CAPS as needed.    . Oxymetazoline HCl (AFRIN NASAL SPRAY NA) Place into the nose as needed.      Marland Kitchen Phenylephrine-APAP-Guaifenesin (MUCINEX SINUS-MAX CONGESTION) 5-325-200 MG TABS Per bottle directions as needed    . Potassium Acetate POWD 1 tablet by Does not apply route.    . sildenafil (VIAGRA) 100 MG tablet Take 1 tablet (100 mg total) by mouth daily as needed for erectile dysfunction. 10 tablet 12  . SYMBICORT 160-4.5 MCG/ACT inhaler INHALE 1-2 PUFFS TWICE DAILY 10.2 Inhaler 12   No facility-administered medications prior to visit.    No Known Allergies  Patient Care Team: Jerrol Banana., MD as PCP - General (Unknown Physician Specialty)  Objective:   Vitals:  Filed Vitals:   02/17/16 1016  BP: 128/76  Pulse: 76  Temp: 98.7 F (37.1 C)  TempSrc: Oral  Resp: 14  Height: '6\' 1"'$  (1.854 m)  Weight: 259 lb (117.482 kg)    Physical Exam  Constitutional: He is oriented to person, place, and time. He appears well-developed and well-nourished.  HENT:  Head: Normocephalic and atraumatic.  Right Ear: External ear normal.  Left Ear: External ear normal.  Nose: Nose normal.  Mouth/Throat: Oropharynx is clear and moist.  Eyes: Conjunctivae and EOM are normal. Pupils are equal, round, and reactive to light.  Neck: Normal range of motion. Neck supple.  Cardiovascular: Normal rate, regular rhythm, normal heart sounds and intact distal pulses.   Pulmonary/Chest: Effort normal and breath sounds normal.  Abdominal: Soft. Bowel sounds are normal.  Musculoskeletal: Normal range of motion.  Neurological: He is alert and  oriented to person, place, and time. He has normal reflexes.  Skin: Skin is warm and dry.  Psychiatric: He has a normal mood and affect. His behavior is normal. Judgment and thought content normal.    Activities of Daily Living In your present state of health, do you have any difficulty performing the following activities: 02/17/2016  Hearing? N  Vision? N  Difficulty concentrating or making decisions? N  Walking or climbing stairs? N  Dressing or bathing? N  Doing errands, shopping? N    Fall Risk Assessment Fall Risk  02/17/2016  Falls in the past year? No     Depression Screen PHQ 2/9 Scores 02/17/2016  PHQ - 2 Score 0    Cognitive Testing - 6-CIT    Year: 0 points  Month: 0 points  Memorize "Pia Mau, [address] [place]"  Time (within 1 hour:) 0  points  Count backwards from 20: 0 points  Name months of year: 0 points  Repeat Address: 4 points   Total Score: 4/28  Interpretation : Normal (0-7) Abnormal (8-28)    Assessment & Plan:     Annual Wellness Visit  Reviewed patient's Family Medical History Reviewed and updated list of patient's medical providers Assessment of cognitive impairment was done Assessed patient's functional ability Established a written schedule for health screening [place] Completed and Reviewed  Exercise Activities and Dietary recommendations Goals    None      Immunization History  Administered Date(s) Administered  . Influenza Whole 07/31/2012  . Pneumococcal Conjugate-13 08/04/2014  . Td 07/30/2004  . Tdap 08/04/2014    Health Maintenance  Topic Date Due  . Hepatitis C Screening  1945-12-10  . ZOSTAVAX  03/11/2006  . PNA vac Low Risk Adult (2 of 2 - PPSV23) 08/05/2015  . INFLUENZA VACCINE  05/31/2016  . COLONOSCOPY  05/24/2017  . TETANUS/TDAP  08/04/2024   1. Medicare annual wellness visit, subsequent   2. Need for hepatitis C screening test  - Hepatitis C antibody  3. Allergic  rhinitis, unspecified allergic rhinitis type  - CBC with Differential/Platelet - loratadine (CLARITIN) 10 MG tablet; Take 1 tablet (10 mg total) by mouth daily.  Dispense: 30 tablet; Refill: 12  4. Other fatigue Likely due to not wearing Cpap at night, will check labs to be sure.  - CBC with Differential/Platelet - TSH - Comprehensive metabolic panel  5. OSA (obstructive sleep apnea) Patient instructed to try to wear CPAP nightly. I think this would help him.     Discussed health benefits of physical activity, and encouraged him to engage in regular exercise appropriate for his age and condition.  I have done the exam and reviewed the above chart and it is accurate to the best of my knowledge.   Miguel Aschoff MD [facility] 02/17/2016 10:21 AM  ------------------------------------------------------------------------------------------------------------

## 2016-02-18 ENCOUNTER — Telehealth: Payer: Self-pay

## 2016-02-18 LAB — CBC WITH DIFFERENTIAL/PLATELET
Basophils Absolute: 0 10*3/uL (ref 0.0–0.2)
Basos: 0 %
EOS (ABSOLUTE): 0.3 10*3/uL (ref 0.0–0.4)
Eos: 5 %
Hematocrit: 43.2 % (ref 37.5–51.0)
Hemoglobin: 14.8 g/dL (ref 12.6–17.7)
Immature Grans (Abs): 0 10*3/uL (ref 0.0–0.1)
Immature Granulocytes: 0 %
Lymphocytes Absolute: 1.3 10*3/uL (ref 0.7–3.1)
Lymphs: 21 %
MCH: 31.8 pg (ref 26.6–33.0)
MCHC: 34.3 g/dL (ref 31.5–35.7)
MCV: 93 fL (ref 79–97)
Monocytes Absolute: 0.7 10*3/uL (ref 0.1–0.9)
Monocytes: 10 %
Neutrophils Absolute: 4 10*3/uL (ref 1.4–7.0)
Neutrophils: 64 %
Platelets: 225 10*3/uL (ref 150–379)
RBC: 4.66 x10E6/uL (ref 4.14–5.80)
RDW: 13.5 % (ref 12.3–15.4)
WBC: 6.3 10*3/uL (ref 3.4–10.8)

## 2016-02-18 LAB — COMPREHENSIVE METABOLIC PANEL
ALT: 29 IU/L (ref 0–44)
AST: 26 IU/L (ref 0–40)
Albumin/Globulin Ratio: 1.7 (ref 1.2–2.2)
Albumin: 4.3 g/dL (ref 3.6–4.8)
Alkaline Phosphatase: 53 IU/L (ref 39–117)
BUN/Creatinine Ratio: 13 (ref 10–24)
BUN: 15 mg/dL (ref 8–27)
Bilirubin Total: 0.5 mg/dL (ref 0.0–1.2)
CO2: 26 mmol/L (ref 18–29)
Calcium: 9.5 mg/dL (ref 8.6–10.2)
Chloride: 100 mmol/L (ref 96–106)
Creatinine, Ser: 1.13 mg/dL (ref 0.76–1.27)
GFR calc Af Amer: 76 mL/min/{1.73_m2} (ref >59)
GFR calc non Af Amer: 66 mL/min/{1.73_m2} (ref >59)
Globulin, Total: 2.5 g/dL (ref 1.5–4.5)
Glucose: 93 mg/dL (ref 65–99)
Potassium: 5.3 mmol/L — ABNORMAL HIGH (ref 3.5–5.2)
Sodium: 143 mmol/L (ref 134–144)
Total Protein: 6.8 g/dL (ref 6.0–8.5)

## 2016-02-18 LAB — TSH: TSH: 3.29 u[IU]/mL (ref 0.450–4.500)

## 2016-02-18 LAB — HEPATITIS C ANTIBODY: Hep C Virus Ab: <0.1 s/co ratio (ref 0.0–0.9)

## 2016-02-18 NOTE — Telephone Encounter (Signed)
-----   Message from Jerrol Banana., MD sent at 02/18/2016  8:09 AM EDT ----- Labs and normal range.

## 2016-02-18 NOTE — Telephone Encounter (Signed)
LMTCB ED 

## 2016-02-22 MED ORDER — ATORVASTATIN CALCIUM 20 MG PO TABS: 20.0000 mg | ORAL_TABLET | Freq: Every day | ORAL | Status: DC

## 2016-02-22 NOTE — Telephone Encounter (Signed)
Patient's wife advised-aa 

## 2016-02-22 NOTE — Addendum Note (Signed)
Addended by: Arnette Norris on: 02/22/2016 11:10 AM   Modules accepted: Orders

## 2016-03-02 ENCOUNTER — Ambulatory Visit (INDEPENDENT_AMBULATORY_CARE_PROVIDER_SITE_OTHER): Payer: Medicare Other | Admitting: Family Medicine

## 2016-03-02 VITALS — BP 132/84 | HR 72 | Temp 98.8°F | Resp 14 | Wt 255.0 lb

## 2016-03-02 DIAGNOSIS — N529 Male erectile dysfunction, unspecified: Secondary | ICD-10-CM

## 2016-03-02 DIAGNOSIS — R252 Cramp and spasm: Secondary | ICD-10-CM | POA: Diagnosis not present

## 2016-03-02 DIAGNOSIS — J309 Allergic rhinitis, unspecified: Secondary | ICD-10-CM

## 2016-03-02 DIAGNOSIS — R5382 Chronic fatigue, unspecified: Secondary | ICD-10-CM | POA: Diagnosis not present

## 2016-03-02 DIAGNOSIS — G4733 Obstructive sleep apnea (adult) (pediatric): Secondary | ICD-10-CM

## 2016-03-02 MED ORDER — FLUTICASONE PROPIONATE 50 MCG/ACT NA SUSP: 2.0000 | Freq: Every day | NASAL | Status: DC

## 2016-03-02 MED ORDER — SILDENAFIL CITRATE 20 MG PO TABS: ORAL_TABLET | ORAL | Status: DC

## 2016-03-02 NOTE — Patient Instructions (Signed)
Try Tonic water and if that does not help muscle cramps can try OTC Magnesium Oxide.

## 2016-03-02 NOTE — Progress Notes (Signed)
Patient ID: RAM HAUGAN, male   DOB: December 29, 1945, 70 y.o.   MRN: [id number]    Subjective:  HPI  Patient is here for 2 weeks follow up.   We had CBC, TSH and Cmet checked on April 19th and labs were normal. Patient states energy level about the same, he is wearing his CPAP machine nightly and states doing ok with that. We did start patient on Loratadine and he states he has been taking it daily and feels like symptoms are better.  Prior to Admission medications   Medication Sig Start Date End Date Taking? Authorizing Provider  aspirin 81 MG tablet Take 81 mg by mouth daily.   Yes Historical Provider, MD  atorvastatin (LIPITOR) 20 MG tablet Take 1 tablet (20 mg total) by mouth daily. 02/22/16  Yes Halia Franey Maceo Pro., MD  DIPHENHYDRAMINE HCL PO Take 50 mg by mouth as needed.     Yes Historical Provider, MD  Diphenhydramine-APAP, sleep, (TYLENOL PM EXTRA STRENGTH PO) Take 1 tablet by mouth as needed.     Yes Historical Provider, MD  folic acid (FOLVITE) 917 MCG tablet Take 400 mcg by mouth daily.   Yes Historical Provider, MD  ibuprofen (ADVIL,MOTRIN) 200 MG tablet Take by mouth.   Yes Historical Provider, MD  levalbuterol [facility] HFA) 45 MCG/ACT inhaler Inhale 1-2 puffs into the lungs every 4 (four) hours as needed.   Yes Historical Provider, MD  levothyroxine (SYNTHROID, LEVOTHROID) 75 MCG tablet TAKE 1 TABLET BY MOUTH EVERY DAY 06/10/15  Yes Jerrol Banana., MD  loratadine (CLARITIN) 10 MG tablet Take 1 tablet (10 mg total) by mouth daily. 02/17/16  Yes Yanelle Sousa Maceo Pro., MD  Multiple Vitamins-Minerals (CENTRUM SILVER ADULT 50+ PO) Take 1 tablet by mouth daily.   Yes Historical Provider, MD  Na Sulfate-K Sulfate-Mg Sulf SOLN Take 1 kit by mouth once. suprep as directed. No substitutions. 04/10/15  Yes Irene Shipper, MD  Naproxen Sodium (ALEVE) 220 MG CAPS as needed.   Yes Historical Provider, MD  Oxymetazoline HCl (AFRIN NASAL SPRAY NA) Place into the nose as needed.     Yes  Historical Provider, MD  Phenylephrine-APAP-Guaifenesin (MUCINEX SINUS-MAX CONGESTION) 5-325-200 MG TABS Per bottle directions as needed   Yes Historical Provider, MD  Potassium Acetate POWD 1 tablet by Does not apply route.   Yes Historical Provider, MD  sildenafil (VIAGRA) 100 MG tablet Take 1 tablet (100 mg total) by mouth daily as needed for erectile dysfunction. 05/14/15  Yes Standley Bargo Maceo Pro., MD  SYMBICORT 160-4.5 MCG/ACT inhaler INHALE 1-2 PUFFS TWICE DAILY 02/01/16  Yes Floraine Buechler Maceo Pro., MD    Patient Active Problem List   Diagnosis Date Noted  . Non-seminomatous testicular cancer ([place]) 05/22/2015  . Arteriosclerosis of coronary artery 05/14/2015  . Allergic rhinitis 03/11/2015  . Airway hyperreactivity 03/11/2015  . Benign essential tremor 03/11/2015  . Benign neoplasm of colon 03/11/2015  . Failure of erection 03/11/2015  . Esophagitis, reflux 03/11/2015  . Essential (primary) hypertension 03/11/2015  . Gastro-esophageal reflux disease without esophagitis 03/11/2015  . HLD (hyperlipidemia) 03/11/2015  . Cannot sleep 03/11/2015  . Adult hypothyroidism 03/11/2015  . Chronic airway obstruction ([place]) 03/11/2015  . Acid reflux 05/07/2014  . Cough 09/06/2012  . Shortness of breath 09/04/2012  . OSA (obstructive sleep apnea) 09/04/2012    Past Medical History  Diagnosis Date  . COPD (chronic obstructive pulmonary disease) ([place])   . GERD (gastroesophageal reflux disease)   . OSA (obstructive  sleep apnea)     wears CPAP  . Benign neoplasm of colon   . Unspecified sinusitis (chronic)   . ED (erectile dysfunction)   . Internal hemorrhoids without mention of complication   . Allergic rhinitis, cause unspecified   . Unspecified asthma(493.90)   . Hyperlipidemia   . Collapsed lung   . Testicular cancer (HCC) 07/2014  . Thyroid disease   . Sleep apnea     Social History   Social History  . Marital Status: Married    Spouse Name: N/A  . Number of Children: 2  .  Years of Education: N/A   Occupational History  . retired     Holiday representative   Social History Main Topics  . Smoking status: Never Smoker   . Smokeless tobacco: Never Used  . Alcohol Use: 4.2 oz/week    7 Standard drinks or equivalent per week     Comment: social  . Drug Use: No  . Sexual Activity: Not on file   Other Topics Concern  . Not on file   Social History Narrative    No Known Allergies  Review of Systems  Constitutional: Positive for malaise/fatigue.  HENT: Positive for congestion.   Respiratory: Negative.   Cardiovascular: Negative.     Immunization History  Administered Date(s) Administered  . Influenza Whole 07/31/2012  . Pneumococcal Conjugate-13 08/04/2014  . Td 07/30/2004  . Tdap 08/04/2014   Objective:  BP 132/84 mmHg  Pulse 72  Temp(Src) 98.8 F (37.1 C)  Resp 14  Wt 255 lb (115.667 kg)  Physical Exam  Constitutional: He is oriented to person, place, and time and well-developed, well-nourished, and in no distress.  HENT:  Head: Normocephalic and atraumatic.  Right Ear: External ear normal.  Left Ear: External ear normal.  Nose: Nose normal.  Eyes: Conjunctivae are normal. Pupils are equal, round, and reactive to light.  Neck: Normal range of motion. Neck supple.  Cardiovascular: Normal rate, regular rhythm, normal heart sounds and intact distal pulses.   No murmur heard. Pulmonary/Chest: Effort normal and breath sounds normal. No respiratory distress. He has no wheezes.  Neurological: He is alert and oriented to person, place, and time.  Skin: Skin is warm and dry.  Psychiatric: Mood, memory, affect and judgment normal.    Lab Results  Component Value Date   WBC 6.3 02/17/2016   HGB 15.0 05/09/2014   HCT 43.2 02/17/2016   PLT 225 02/17/2016   GLUCOSE 93 02/17/2016   CHOL 140 05/15/2015   TRIG 168* 05/15/2015   HDL 43 05/15/2015   LDLCALC 63 05/15/2015   TSH 3.290 02/17/2016   PSA 0.5 05/09/2014    CMP     Component Value  Date/Time   NA 143 02/17/2016 1052   NA 141 06/26/2014 1355   K 5.3* 02/17/2016 1052   K 3.7 06/26/2014 1355   CL 100 02/17/2016 1052   CL 106 06/26/2014 1355   CO2 26 02/17/2016 1052   CO2 27 06/26/2014 1355   GLUCOSE 93 02/17/2016 1052   GLUCOSE 161* 06/26/2014 1355   BUN 15 02/17/2016 1052   BUN 16 06/26/2014 1355   CREATININE 1.13 02/17/2016 1052   CREATININE 0.9 11/13/2014   CREATININE 0.97 06/26/2014 1355   CALCIUM 9.5 02/17/2016 1052   CALCIUM 8.4* 06/26/2014 1355   PROT 6.8 02/17/2016 1052   ALBUMIN 4.3 02/17/2016 1052   AST 26 02/17/2016 1052   ALT 29 02/17/2016 1052   ALKPHOS 53 02/17/2016 1052   BILITOT  0.5 02/17/2016 1052   GFRNONAA 66 02/17/2016 1052   GFRNONAA >60 06/26/2014 1355   GFRAA 76 02/17/2016 1052   GFRAA >60 06/26/2014 1355    Assessment and Plan :  1. Chronic fatigue Some improvement, patient is doing more during the day more active. Follow as needed.  2. OSA (obstructive sleep apnea) Patient is using CPAP at night and tolerating well, encouraged patient to use this and that he should feel better after a few weeks.  3. Allergic rhinitis, unspecified allergic rhinitis type Better on Loratadine use, will add Flonase to use also.  4. Muscle cramps Advised patient to try and drink tonic water, and then can try MagOX after that if no improvement.  5.ED Refill given.Generic Viagra. I have done the exam and reviewed the above chart and it is accurate to the best of my knowledge.  Patient was seen and examined by Dr. Eulas Post and note was scribed by Theressa Millard, RMA.    Miguel Aschoff MD [facility] 03/02/2016 1:58 PM

## 2016-06-06 NOTE — Telephone Encounter (Signed)
error 

## 2016-06-08 NOTE — Telephone Encounter (Signed)
error 

## 2016-06-26 ENCOUNTER — Other Ambulatory Visit: Payer: Self-pay | Admitting: Family Medicine

## 2016-07-07 ENCOUNTER — Other Ambulatory Visit: Payer: Self-pay

## 2016-07-07 MED ORDER — LEVOTHYROXINE SODIUM 75 MCG PO TABS: 75.0000 ug | ORAL_TABLET | Freq: Every day | ORAL | 3 refills | Status: DC

## 2016-07-07 MED ORDER — ATORVASTATIN CALCIUM 20 MG PO TABS: 20.0000 mg | ORAL_TABLET | Freq: Every day | ORAL | 3 refills | Status: DC

## 2016-08-09 ENCOUNTER — Ambulatory Visit: Payer: Medicare Other | Admitting: Urology

## 2016-08-10 ENCOUNTER — Ambulatory Visit: Payer: Medicare Other | Admitting: Urology

## 2016-09-14 ENCOUNTER — Ambulatory Visit: Payer: Medicare Other | Admitting: Urology

## 2016-09-20 DIAGNOSIS — M1612 Unilateral primary osteoarthritis, left hip: Secondary | ICD-10-CM | POA: Diagnosis not present

## 2016-10-03 ENCOUNTER — Ambulatory Visit
Admission: RE | Admit: 2016-10-03 | Discharge: 2016-10-03 | Disposition: A | Discharge status: Home / Self Care | Payer: Medicare Other | Source: Ambulatory Visit | Attending: Family Medicine | Admitting: Family Medicine

## 2016-10-03 ENCOUNTER — Ambulatory Visit (INDEPENDENT_AMBULATORY_CARE_PROVIDER_SITE_OTHER): Payer: Medicare Other | Admitting: Family Medicine

## 2016-10-03 VITALS — BP 142/80 | HR 72 | Temp 98.1°F | Resp 16 | Wt 262.0 lb

## 2016-10-03 DIAGNOSIS — M5136 Other intervertebral disc degeneration, lumbar region: Secondary | ICD-10-CM | POA: Diagnosis not present

## 2016-10-03 DIAGNOSIS — I700 Atherosclerosis of aorta: Secondary | ICD-10-CM | POA: Diagnosis not present

## 2016-10-03 DIAGNOSIS — M25552 Pain in left hip: Secondary | ICD-10-CM

## 2016-10-03 DIAGNOSIS — Z23 Encounter for immunization: Secondary | ICD-10-CM | POA: Diagnosis not present

## 2016-10-03 MED ORDER — CYCLOBENZAPRINE HCL 10 MG PO TABS: 10.0000 mg | ORAL_TABLET | Freq: Three times a day (TID) | ORAL | 0 refills | Status: DC | PRN

## 2016-10-03 NOTE — Progress Notes (Signed)
Matthew [name]  MRN: [id number] DOB: [birth year]  Subjective:  HPI   The patient is  70 year old male who presents for evaluation of left hip pain.  He states it has been going on for some time but more significantly in the last few months.  He states that it hurts most when he has been sitting for a while and then goes to get up or when he has been walking for a while.  He has radiation down the leg and states that the pain is on the outer part of the leg and none in the groin area.   He had some Flexoril that was left from a previous episode.   The expiration was 2008 but he took some and states that he did get some relief by using it.  The patient has not had his flu shot and would like to go ahead and get it while here today.  Patient Active Problem List   Diagnosis Date Noted  . Non-seminomatous testicular cancer ([place]) 05/22/2015  . Arteriosclerosis of coronary artery 05/14/2015  . Allergic rhinitis 03/11/2015  . Airway hyperreactivity 03/11/2015  . Benign essential tremor 03/11/2015  . Benign neoplasm of colon 03/11/2015  . Failure of erection 03/11/2015  . Esophagitis, reflux 03/11/2015  . Essential (primary) hypertension 03/11/2015  . Gastro-esophageal reflux disease without esophagitis 03/11/2015  . HLD (hyperlipidemia) 03/11/2015  . Cannot sleep 03/11/2015  . Adult hypothyroidism 03/11/2015  . Chronic airway obstruction ([place]) 03/11/2015  . Acid reflux 05/07/2014  . Cough 09/06/2012  . Shortness of breath 09/04/2012  . OSA (obstructive sleep apnea) 09/04/2012    Past Medical History:  Diagnosis Date  . Allergic rhinitis, cause unspecified   . Benign neoplasm of colon   . Collapsed lung   . COPD (chronic obstructive pulmonary disease) ([place])   . ED (erectile dysfunction)   . GERD (gastroesophageal reflux disease)   . Hyperlipidemia   . Internal hemorrhoids without mention of complication   . OSA (obstructive sleep apnea)    wears CPAP  . Sleep apnea   .  Testicular cancer ([place]) 07/2014  . Thyroid disease   . Unspecified asthma(493.90)   . Unspecified sinusitis (chronic)     Social History   Social History  . Marital status: Married    Spouse name: N/A  . Number of children: 2  . Years of education: N/A   Occupational History  . retired     Architect   Social History Main Topics  . Smoking status: Never Smoker  . Smokeless tobacco: Never Used  . Alcohol use 4.2 oz/week    7 Standard drinks or equivalent per week     Comment: social  . Drug use: No  . Sexual activity: Not on file   Other Topics Concern  . Not on file   Social History Narrative  . No narrative on file    Outpatient Encounter Prescriptions as of 10/03/2016  Medication Sig Note  . aspirin 81 MG tablet Take 81 mg by mouth daily.   Marland Kitchen atorvastatin (LIPITOR) 20 MG tablet Take 1 tablet (20 mg total) by mouth daily.   . Diphenhydramine-APAP, sleep, (TYLENOL PM EXTRA STRENGTH PO) Take 1 tablet by mouth as needed.     . fluticasone (FLONASE) 50 MCG/ACT nasal spray Place 2 sprays into both nostrils daily.   . folic acid (FOLVITE) 017 MCG tablet Take 400 mcg by mouth daily.   Marland Kitchen ibuprofen (ADVIL,MOTRIN) 200 MG tablet Take by mouth. 04/10/2015:  Received from: [facility]  . levalbuterol (XOPENEX HFA) 45 MCG/ACT inhaler Inhale 1-2 puffs into the lungs every 4 (four) hours as needed.   Marland Kitchen levothyroxine (SYNTHROID, LEVOTHROID) 75 MCG tablet Take 1 tablet (75 mcg total) by mouth daily.   Marland Kitchen loratadine (CLARITIN) 10 MG tablet Take 1 tablet (10 mg total) by mouth daily.   . Multiple Vitamins-Minerals (CENTRUM SILVER ADULT 50+ PO) Take 1 tablet by mouth daily.   . Naproxen Sodium (ALEVE) 220 MG CAPS as needed.   . Oxymetazoline HCl (AFRIN NASAL SPRAY NA) Place into the nose as needed.     Marland Kitchen Phenylephrine-APAP-Guaifenesin (MUCINEX SINUS-MAX CONGESTION) 5-325-200 MG TABS Per bottle directions as needed   . Potassium Acetate POWD 1 tablet by Does not apply  route.   . sildenafil (REVATIO) 20 MG tablet Take 2 to 4 tablets daily as needed   . SYMBICORT 160-4.5 MCG/ACT inhaler INHALE 1-2 PUFFS TWICE DAILY   . [DISCONTINUED] DIPHENHYDRAMINE HCL PO Take 50 mg by mouth as needed.     . [DISCONTINUED] Na Sulfate-K Sulfate-Mg Sulf SOLN Take 1 kit by mouth once. suprep as directed. No substitutions.    No facility-administered encounter medications on file as of 10/03/2016.     No Known Allergies  Review of Systems  Constitutional: Negative for fever and malaise/fatigue.  Respiratory: Positive for shortness of breath (chronic and unchanged). Negative for wheezing. Cough: chronic and unchanged.   Cardiovascular: Negative for chest pain, palpitations, orthopnea, claudication, leg swelling and PND.  Musculoskeletal: Positive for joint pain. Negative for back pain, falls, myalgias and neck pain.  Neurological: Negative for dizziness, weakness and headaches.    Objective:  BP (!) 142/80 (BP Location: Right Arm, Patient Position: Sitting, Cuff Size: Large)   Pulse 72   Temp 98.1 F (36.7 C) (Oral)   Resp 16   Wt 262 lb (118.8 kg)   BMI 34.57 kg/m   Physical Exam  Constitutional: He is oriented to person, place, and time and well-developed, well-nourished, and in no distress.  HENT:  Head: Normocephalic and atraumatic.  Eyes: Conjunctivae are normal. No scleral icterus.  Neck: No thyromegaly present.  Cardiovascular: Normal rate, regular rhythm and normal heart sounds.   Pulmonary/Chest: Effort normal and breath sounds normal.  Abdominal: Soft.  Musculoskeletal:  Mild discomfort with figure-of-four maneuver. Otherwise exam of lower extremities is normal  Neurological: He is alert and oriented to person, place, and time. No cranial nerve deficit. He exhibits normal muscle tone. Gait normal. Coordination normal.  Skin: Skin is warm and dry.  Psychiatric: Mood, memory, affect and judgment normal.    Assessment and Plan :   1. Need for  influenza vaccination  - Flu vaccine HIGH DOSE PF (Fluzone High dose)  2. Pain of left hip joint This is a chronic problem for patient. He stretches out in the morning and feels better. This well could be from his back. Try Flexeril at bedtime and reassess in 3-4 months. In the past Flexeril has helped with this. - cyclobenzaprine (FLEXERIL) 10 MG tablet; Take 1 tablet (10 mg total) by mouth 3 (three) times daily as needed for muscle spasms.  Dispense: 30 tablet; Refill: 0 - DG Lumbar Spine Complete; Future - DG HIP UNILAT WITH PELVIS 2-3 VIEWS LEFT; Future 3. OSA Patient wears CPAP nightly.  HPI, Exam and A&P Transcribed under the direction and in the presence of Wilhemena Durie., MD. Electronically Signed: Althea Charon, RMA I have done the exam and reviewed the  chart and it is accurate to the best of my knowledge. Development worker, community has been used and  any errors in dictation or transcription are unintentional. Miguel Aschoff M.D. [facility]

## 2016-10-04 ENCOUNTER — Telehealth: Payer: Self-pay | Admitting: Family Medicine

## 2016-10-04 NOTE — Telephone Encounter (Signed)
Pt is returning call.  [id number]

## 2016-10-05 NOTE — Telephone Encounter (Signed)
-----   Message from Jerrol Banana., MD sent at 10/04/2016  8:14 AM EST ----- [facility] hip xray

## 2016-10-05 NOTE — Telephone Encounter (Signed)
-----   Message from Jerrol Banana., MD sent at 10/04/2016  8:14 AM EST ----- Mild DDD noted.

## 2016-10-05 NOTE — Telephone Encounter (Signed)
Patient advised.

## 2016-10-07 ENCOUNTER — Ambulatory Visit: Payer: Medicare Other | Admitting: Urology

## 2016-10-10 DIAGNOSIS — Z23 Encounter for immunization: Secondary | ICD-10-CM | POA: Diagnosis not present

## 2016-10-18 ENCOUNTER — Ambulatory Visit: Payer: Medicare Other | Admitting: Urology

## 2016-10-18 ENCOUNTER — Encounter: Payer: Self-pay | Admitting: Urology

## 2016-10-18 VITALS — BP 166/81 | HR 72 | Ht 71.0 in | Wt 256.0 lb

## 2016-10-18 DIAGNOSIS — Z8547 Personal history of malignant neoplasm of testis: Secondary | ICD-10-CM | POA: Diagnosis not present

## 2016-10-18 NOTE — Progress Notes (Signed)
4:16 PM  10/18/16   Matthew [name] Nov 19, 1945 [id number]  Referring provider: Jerrol Banana., MD [address] [place], [place] [address]  Chief Complaint  Patient presents with  . 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 He is overdue today for his routine follow-up. He was last seen 1/17 and never had follow up imaging as recommended.   Pathology for left inguinal radical orchiectomy from 07/02/14 shows mixed germ cell tumor with 40% embryonal, 40% teratoma, 5% yolk sac, and 5% seminoma. Tumor confined to testicle without LVI or local invasion of tunica vaginalis. Tumor markers negative.  CT abd/ pelvis 07/08/14 showed no evidence of metastatic disease or lymphadenopathy. Incidental 4 mm RLP stone identified. CXR with R basilar atelectasis, no evidence of mets. Follow up imaging in 11/06/14 showed soft tissue prominence in the left inguinal region just distal to the left inguinal ring measuring 1.5 x 1.2 cm ? enlarging LN vs. post operative changes.   Follow up 05/2015 showed stable soft tissue changes left inguinal canal most likely representing postoperative scarring.  He did not undergo follow-up imaging as ordered as he has been out of this study working.  He denies any pain, weight loss, night sweats, or any other symptoms. He continues to struggle with fatigue and shortness of breath.  He is followed for this by Dr. Rosanna Randy.   PMH: Past Medical History:  Diagnosis Date  . Allergic rhinitis, cause unspecified   . Benign neoplasm of colon   . Collapsed lung   . COPD (chronic obstructive pulmonary disease) ([place])   . ED (erectile dysfunction)   . GERD (gastroesophageal reflux disease)   . Hyperlipidemia   . Internal hemorrhoids without mention of complication   . OSA (obstructive sleep apnea)    wears CPAP  . Sleep apnea   .  Testicular cancer ([place]) 07/2014  . Thyroid disease   . Unspecified asthma(493.90)   . Unspecified sinusitis (chronic)     Surgical History: Past Surgical History:  Procedure Laterality Date  . COLONOSCOPY    . DENTAL SURGERY  12/2014  . INGUINAL HERNIA REPAIR    . NOSE SURGERY     submucous surgery  . POLYPECTOMY    . SINUS SURGERY WITH INSTATRAK    . SUBCLAVIAN BYPASS GRAFT  2009  . TESTICLE REMOVAL      Home Medications:  Allergies as of 10/18/2016   No Known Allergies     Medication List       Accurate as of 10/18/16  4:16 PM. Always use your most recent med list.          [place] NA Place into the nose as needed.   ALEVE 220 MG Caps Generic drug:  Naproxen Sodium as needed.   aspirin 81 MG tablet Take 81 mg by mouth daily.   atorvastatin 20 MG tablet Commonly known as:  LIPITOR Take 1 tablet (20 mg total) by mouth daily.   CENTRUM SILVER ADULT 50+ PO Take 1 tablet by mouth daily.   cyclobenzaprine 10 MG tablet Commonly known as:  FLEXERIL Take 1 tablet (10 mg total) by mouth 3 (three) times daily as needed for muscle spasms.   fluticasone 50 MCG/ACT nasal spray Commonly known as:  FLONASE Place 2 sprays into both nostrils daily.   folic acid A999333 MCG  tablet Commonly known as:  FOLVITE Take 400 mcg by mouth daily.   ibuprofen 200 MG tablet Commonly known as:  ADVIL,MOTRIN Take by mouth.   levalbuterol 45 MCG/ACT inhaler Commonly known as:  XOPENEX HFA Inhale 1-2 puffs into the lungs every 4 (four) hours as needed.   levothyroxine 75 MCG tablet Commonly known as:  SYNTHROID, LEVOTHROID Take 1 tablet (75 mcg total) by mouth daily.   loratadine 10 MG tablet Commonly known as:  CLARITIN Take 1 tablet (10 mg total) by mouth daily.   MUCINEX SINUS-MAX CONGESTION 5-325-200 MG Tabs Generic drug:  Phenylephrine-APAP-Guaifenesin Per bottle directions as needed   Potassium Acetate Powd 1 tablet by Does not apply route.   sildenafil  20 MG tablet Commonly known as:  REVATIO Take 2 to 4 tablets daily as needed   SYMBICORT 160-4.5 MCG/ACT inhaler Generic drug:  budesonide-formoterol INHALE 1-2 PUFFS TWICE DAILY   TYLENOL PM EXTRA STRENGTH PO Take 1 tablet by mouth as needed.       Allergies: No Known Allergies  Family History: Family History  Problem Relation Age of Onset  . Colon cancer Sister   . Heart attack Sister   . Breast cancer Sister   . Esophageal cancer Neg Hx   . Stomach cancer Neg Hx   . Rectal cancer Neg Hx   . Breast cancer Sister   . Heart attack Sister     deceased from MI  . Allergies Father   . Heart attack Father   . CAD Father     deceased 54 from fall and possible subdural hematoma  . Congestive Heart Failure Mother   . Emphysema Mother     deceased 66 with emphysema, tobacco abuse  . Asthma Sister   . CAD Brother   . Stroke Brother   . Lung cancer Brother   . Arthritis Sister     Social History:  reports that he has never smoked. He has never used smokeless tobacco. He reports that he drinks about 4.2 oz of alcohol per week . He reports that he does not use drugs.  ROS: UROLOGY Frequent Urination?: No Hard to postpone urination?: No Burning/pain with urination?: No Get up at night to urinate?: No Leakage of urine?: No Urine stream starts and stops?: No Trouble starting stream?: No Do you have to strain to urinate?: No Blood in urine?: No Urinary tract infection?: No Sexually transmitted disease?: No Injury to kidneys or bladder?: No Painful intercourse?: No Weak stream?: No Erection problems?: No Penile pain?: No  Gastrointestinal Nausea?: No Vomiting?: No Indigestion/heartburn?: No Diarrhea?: No Constipation?: No  Constitutional Fever: No Night sweats?: No Weight loss?: No Fatigue?: Yes  Skin Skin rash/lesions?: No Itching?: No  Eyes Blurred vision?: No Double vision?: No  Ears/Nose/Throat Sore throat?: No Sinus problems?:  Yes  Hematologic/Lymphatic Swollen glands?: No Easy bruising?: No  Cardiovascular Leg swelling?: No Chest pain?: No  Respiratory Cough?: Yes Shortness of breath?: Yes  Endocrine Excessive thirst?: No  Musculoskeletal Back pain?: No Joint pain?: No  Neurological Headaches?: No Dizziness?: No  Psychologic Depression?: No Anxiety?: No  Physical Exam: BP (!) 166/81   Pulse 72   Ht 5\' 11"  (1.803 m)   Wt 256 lb (116.1 kg)   BMI 35.70 kg/m   Constitutional:  Alert and oriented, No acute distress. HEENT: [place] AT, moist mucus membranes.  Trachea midline, no masses. Cardiovascular: No clubbing, cyanosis, or edema. Respiratory: Normal respiratory effort, no increased work of breathing. GI: Abdomen is soft,  nontender, nondistended, no abdominal masses.  Significant suprapubic fat pad. GU: scrotum unremarkable with solitary testis, no palpable tumors. Multiple phallus with orthotopic meatus. Left inguinal area well-healed scar without any palpable adenopathy. Skin: No rashes, bruises or suspicious lesions. Lymph: No cervical or inguinal adenopathy. Neurologic: Grossly intact, no focal deficits, moving all 4 extremities. Psychiatric: Normal mood and affect.  Laboratory Data: Lab Results  Component Value Date   WBC 6.3 02/17/2016   HGB 15.0 05/09/2014   HCT 43.2 02/17/2016   MCV 93 02/17/2016   PLT 225 02/17/2016    Lab Results  Component Value Date   CREATININE 1.13 02/17/2016    Pertinent Imaging: pending  Assessment & Plan:  70 year old male with stage IA mixed germ cell testicular cancer diagnosed 07/2014 on active surveillance protocol.  Per NCCN and guidelines,  imaging may be spread out to CT scan every 6 months - 1 year with chest x-ray annually. We will plan for getting a CT scan/ CXR in the near future and then annually thereafter.  1. Non-seminomatous testicular cancer, left  As above Importance of routine follow up was stressed Plan to call with results  of tumor markers and CT/chest x-ray results Once these are completed, we will plan for follow-up imaging in 1 year if no evidence of disease - CT Abdomen Pelvis W Wo Contrast; Future - Chest 1 View; Future - Lactate dehydrogenase - AFP tumor marker - Beta HCG, Quant (tumor marker)   Return in about 1 year (around 10/18/2017) for with CT abd/ pelvis/ CXR, labs.   Hollice Espy, MD  [facility] [address] [place], [place] [address] [phone]

## 2016-10-19 ENCOUNTER — Encounter: Payer: Self-pay | Admitting: Physician Assistant

## 2016-10-19 ENCOUNTER — Ambulatory Visit (INDEPENDENT_AMBULATORY_CARE_PROVIDER_SITE_OTHER): Payer: Medicare Other | Admitting: Physician Assistant

## 2016-10-19 VITALS — BP 162/86 | HR 68 | Temp 98.2°F | Resp 16 | Wt 260.0 lb

## 2016-10-19 DIAGNOSIS — J441 Chronic obstructive pulmonary disease with (acute) exacerbation: Secondary | ICD-10-CM | POA: Diagnosis not present

## 2016-10-19 LAB — BETA HCG QUANT (REF LAB): hCG Quant: <1 m[IU]/mL (ref 0–3)

## 2016-10-19 LAB — AFP TUMOR MARKER: AFP-Tumor Marker: 4.8 ng/mL (ref 0.0–8.3)

## 2016-10-19 LAB — LACTATE DEHYDROGENASE: LDH: 308 IU/L — ABNORMAL HIGH (ref 121–224)

## 2016-10-19 MED ORDER — AZITHROMYCIN 250 MG PO TABS: ORAL_TABLET | ORAL | 0 refills | Status: DC

## 2016-10-19 NOTE — Progress Notes (Signed)
[facility]   No chief complaint on file.   Subjective:    Patient ID: Matthew [name], male    DOB: Aug 18, 1946, 70 y.o.   MRN: [id number]  Upper Respiratory Infection: Matthew [name] is a 70 y.o. male with a past medical history significant for COPD, Hypertension complaining of symptoms of a URI. Symptoms include congestion, cough and sore throat. Onset of symptoms was 4 days ago, gradually improving since that time. He also c/o congestion, lightheadedness, post nasal drip and productive cough with  clear colored sputum for the past 4 days .  He is drinking plenty of fluids. Evaluation to date: none. Treatment to date: cough suppressants and decongestants. The treatment has provided no relief.   Review of Systems  Constitutional: Negative.   HENT: Positive for congestion, postnasal drip, rhinorrhea, sinus pain, sinus pressure, sore throat and trouble swallowing. Negative for ear discharge, ear pain, nosebleeds, sneezing, tinnitus and voice change.   Eyes: Positive for discharge and itching. Negative for photophobia, pain, redness and visual disturbance.  Respiratory: Positive for cough, chest tightness, shortness of breath and wheezing. Negative for apnea, choking and stridor.   Gastrointestinal: Negative.   Neurological: Positive for light-headedness. Negative for dizziness and headaches.       Objective:   BP (!) 162/86 (BP Location: Left Arm, Patient Position: Sitting, Cuff Size: Large)   Pulse 68   Temp 98.2 F (36.8 C) (Oral)   Resp 16   Wt 260 lb (117.9 kg)   BMI 36.26 kg/m   Patient Active Problem List   Diagnosis Date Noted  . Non-seminomatous testicular cancer ([place]) 05/22/2015  . Arteriosclerosis of coronary artery 05/14/2015  . Allergic rhinitis 03/11/2015  . Airway hyperreactivity 03/11/2015  . Benign essential tremor 03/11/2015  . Benign neoplasm of colon 03/11/2015  . Failure of erection 03/11/2015  . Esophagitis, reflux 03/11/2015  . Essential  (primary) hypertension 03/11/2015  . Gastro-esophageal reflux disease without esophagitis 03/11/2015  . HLD (hyperlipidemia) 03/11/2015  . Cannot sleep 03/11/2015  . Adult hypothyroidism 03/11/2015  . Chronic airway obstruction ([place]) 03/11/2015  . Acid reflux 05/07/2014  . Cough 09/06/2012  . Shortness of breath 09/04/2012  . OSA (obstructive sleep apnea) 09/04/2012    Outpatient Encounter Prescriptions as of 10/19/2016  Medication Sig Note  . aspirin 81 MG tablet Take 81 mg by mouth daily.   Marland Kitchen atorvastatin (LIPITOR) 20 MG tablet Take 1 tablet (20 mg total) by mouth daily.   . cyclobenzaprine (FLEXERIL) 10 MG tablet Take 1 tablet (10 mg total) by mouth 3 (three) times daily as needed for muscle spasms.   . Diphenhydramine-APAP, sleep, (TYLENOL PM EXTRA STRENGTH PO) Take 1 tablet by mouth as needed.     . fluticasone (FLONASE) 50 MCG/ACT nasal spray Place 2 sprays into both nostrils daily.   . folic acid (FOLVITE) A999333 MCG tablet Take 400 mcg by mouth daily.   Marland Kitchen ibuprofen (ADVIL,MOTRIN) 200 MG tablet Take by mouth. 04/10/2015: Received from: [facility]  . levalbuterol (XOPENEX HFA) 45 MCG/ACT inhaler Inhale 1-2 puffs into the lungs every 4 (four) hours as needed.   Marland Kitchen levothyroxine (SYNTHROID, LEVOTHROID) 75 MCG tablet Take 1 tablet (75 mcg total) by mouth daily.   Marland Kitchen loratadine (CLARITIN) 10 MG tablet Take 1 tablet (10 mg total) by mouth daily.   . Multiple Vitamins-Minerals (CENTRUM SILVER ADULT 50+ PO) Take 1 tablet by mouth daily.   . Naproxen Sodium (ALEVE) 220 MG CAPS as needed.   Marland Kitchen  Oxymetazoline HCl (AFRIN NASAL SPRAY NA) Place into the nose as needed.     Marland Kitchen Phenylephrine-APAP-Guaifenesin (MUCINEX SINUS-MAX CONGESTION) 5-325-200 MG TABS Per bottle directions as needed   . Potassium Acetate POWD 1 tablet by Does not apply route.   . sildenafil (REVATIO) 20 MG tablet Take 2 to 4 tablets daily as needed   . SYMBICORT 160-4.5 MCG/ACT inhaler INHALE 1-2 PUFFS TWICE DAILY    . azithromycin (ZITHROMAX) 250 MG tablet Two tablets for one day, then decrease to one tablet a day until gone.   . [DISCONTINUED] azithromycin (ZITHROMAX) 250 MG tablet Two tablets for one day, then decrease to one tablet a day until gone.    No facility-administered encounter medications on file as of 10/19/2016.     No Known Allergies     Physical Exam  Constitutional: He is oriented to person, place, and time. He appears well-developed and well-nourished.  Eyes: Right eye exhibits discharge. Left eye exhibits discharge.  Neck: Neck supple.  Cardiovascular: Normal rate, regular rhythm and normal heart sounds.   Pulmonary/Chest: Effort normal and breath sounds normal. No respiratory distress. He has no wheezes. He has no rales.  Lymphadenopathy:    He has no cervical adenopathy.  Neurological: He is alert and oriented to person, place, and time.  Skin: Skin is warm and dry.  Psychiatric: He has a normal mood and affect. His behavior is normal.       Assessment & Plan:   Problem List Items Addressed This Visit    None    Visit Diagnoses    COPD exacerbation ([place])    -  Primary   Relevant Medications   azithromycin (ZITHROMAX) 250 MG tablet     Patient is 70 y/o male with COPD and increased sputum production concerning for COPD exacerbation. Patient is travelling to [place]. Will give Z-pack. Patient may take rescue inhaler every four hours for three days.   The entirety of the information documented in the History of Present Illness, Review of Systems and Physical Exam were personally obtained by me. Portions of this information were initially documented by Ashley Royalty, CMA and reviewed by me for thoroughness and accuracy.   Return if symptoms worsen or fail to improve.   Patient Instructions  Chronic Obstructive Pulmonary Disease Exacerbation Chronic obstructive pulmonary disease (COPD) is a common lung problem. In COPD, the flow of air from the lungs is  limited. COPD exacerbations are times that breathing gets worse and you need extra treatment. Without treatment they can be life threatening. If they happen often, your lungs can become more damaged. If your COPD gets worse, your doctor may treat you with:  Medicines.  Oxygen.  Different ways to clear your airway, such as using a mask. Follow these instructions at home:  Do not smoke.  Avoid tobacco smoke and other things that bother your lungs.  If given, take your antibiotic medicine as told. Finish the medicine even if you start to feel better.  Only take medicines as told by your doctor.  Drink enough fluids to keep your pee (urine) clear or pale yellow (unless your doctor has told you not to).  Use a cool mist machine (vaporizer).  If you use oxygen or a machine that turns liquid medicine into a mist (nebulizer), continue to use them as told.  Keep up with shots (vaccinations) as told by your doctor.  Exercise regularly.  Eat healthy foods.  Keep all doctor visits as told. Get help right away if:  You are very short of breath and it gets worse.  You have trouble talking.  You have bad chest pain.  You have blood in your spit (sputum).  You have a fever.  You keep throwing up (vomiting).  You feel weak, or you pass out (faint).  You feel confused.  You keep getting worse. This information is not intended to replace advice given to you by your health care provider. Make sure you discuss any questions you have with your health care provider. Document Released: 10/06/2011 Document Revised: 03/24/2016 Document Reviewed: 06/21/2013 Elsevier Interactive Patient Education  2017 [employer].     The entirety of the information documented in the History of Present Illness, Review of Systems and Physical Exam were personally obtained by me. Portions of this information were initially documented by Ashley Royalty, CMA and reviewed by me for thoroughness and accuracy.

## 2016-10-19 NOTE — Patient Instructions (Signed)
Chronic Obstructive Pulmonary Disease Exacerbation  Chronic obstructive pulmonary disease (COPD) is a common lung problem. In COPD, the flow of air from the lungs is limited. COPD exacerbations are times that breathing gets worse and you need extra treatment. Without treatment they can be life threatening. If they happen often, your lungs can become more damaged. If your COPD gets worse, your doctor may treat you with:  ? Medicines.  ? Oxygen.  ? Different ways to clear your airway, such as using a mask.    Follow these instructions at home:  ? Do not smoke.  ? Avoid tobacco smoke and other things that bother your lungs.  ? If given, take your antibiotic medicine as told. Finish the medicine even if you start to feel better.  ? Only take medicines as told by your doctor.  ? Drink enough fluids to keep your pee (urine) clear or pale yellow (unless your doctor has told you not to).  ? Use a cool mist machine (vaporizer).  ? If you use oxygen or a machine that turns liquid medicine into a mist (nebulizer), continue to use them as told.  ? Keep up with shots (vaccinations) as told by your doctor.  ? Exercise regularly.  ? Eat healthy foods.  ? Keep all doctor visits as told.  Get help right away if:  ? You are very short of breath and it gets worse.  ? You have trouble talking.  ? You have bad chest pain.  ? You have blood in your spit (sputum).  ? You have a fever.  ? You keep throwing up (vomiting).  ? You feel weak, or you pass out (faint).  ? You feel confused.  ? You keep getting worse.  This information is not intended to replace advice given to you by your health care provider. Make sure you discuss any questions you have with your health care provider.  Document Released: 10/06/2011 Document Revised: 03/24/2016 Document Reviewed: 06/21/2013  Elsevier Interactive Patient Education ? 2017 [employer] Inc[employer]

## 2016-10-20 ENCOUNTER — Other Ambulatory Visit: Payer: Self-pay | Admitting: Family Medicine

## 2016-10-20 ENCOUNTER — Telehealth: Payer: Self-pay

## 2016-10-20 DIAGNOSIS — Z8547 Personal history of malignant neoplasm of testis: Secondary | ICD-10-CM

## 2016-10-20 NOTE — Telephone Encounter (Signed)
-----   Message from Hollice Espy, MD sent at 10/20/2016  2:46 PM EST ----- Please let Mr. Wiant know that this LDH is elevated which is concerning.  I will follow up with him after his imaging is complete but we need to keep a close eye on this.    Hollice Espy, MD

## 2016-10-20 NOTE — Telephone Encounter (Signed)
LMOM

## 2016-10-21 NOTE — Telephone Encounter (Signed)
LMOM

## 2016-10-25 ENCOUNTER — Ambulatory Visit
Admission: RE | Admit: 2016-10-25 | Discharge: 2016-10-25 | Disposition: A | Discharge status: Home / Self Care | Payer: Medicare Other | Source: Ambulatory Visit | Attending: Urology | Admitting: Urology

## 2016-10-25 DIAGNOSIS — Q791 Other congenital malformations of diaphragm: Secondary | ICD-10-CM | POA: Insufficient documentation

## 2016-10-25 DIAGNOSIS — I700 Atherosclerosis of aorta: Secondary | ICD-10-CM | POA: Diagnosis not present

## 2016-10-25 DIAGNOSIS — N200 Calculus of kidney: Secondary | ICD-10-CM | POA: Diagnosis not present

## 2016-10-25 DIAGNOSIS — R05 Cough: Secondary | ICD-10-CM | POA: Diagnosis not present

## 2016-10-25 DIAGNOSIS — Z8547 Personal history of malignant neoplasm of testis: Secondary | ICD-10-CM

## 2016-10-25 LAB — POCT I-STAT CREATININE: Creatinine, Ser: 0.9 mg/dL (ref 0.61–1.24)

## 2016-10-25 MED ORDER — IOPAMIDOL (ISOVUE-300) INJECTION 61%
100.0000 mL | Freq: Once | INTRAVENOUS | Status: AC | PRN
  Administered 2016-10-25: 100 mL via INTRAVENOUS

## 2016-10-27 ENCOUNTER — Telehealth: Payer: Self-pay | Admitting: Urology

## 2016-10-27 NOTE — Telephone Encounter (Signed)
Spoke with pt in reference to LDH and needing a f/u appt. Pt was transferred to the front to make f/u appt.

## 2016-11-08 ENCOUNTER — Ambulatory Visit: Payer: Medicare Other | Admitting: Urology

## 2016-11-08 ENCOUNTER — Telehealth: Payer: Self-pay | Admitting: Family Medicine

## 2016-11-08 NOTE — Telephone Encounter (Signed)
error 

## 2016-11-21 ENCOUNTER — Ambulatory Visit: Payer: Medicare Other | Admitting: Family Medicine

## 2016-11-23 ENCOUNTER — Encounter: Payer: Self-pay | Admitting: Urology

## 2016-11-23 ENCOUNTER — Ambulatory Visit: Payer: Medicare Other | Admitting: Urology

## 2016-11-23 VITALS — BP 133/83 | HR 80 | Ht 73.0 in | Wt 256.0 lb

## 2016-11-23 DIAGNOSIS — Z8547 Personal history of malignant neoplasm of testis: Secondary | ICD-10-CM | POA: Diagnosis not present

## 2016-11-24 ENCOUNTER — Telehealth: Payer: Self-pay

## 2016-11-24 ENCOUNTER — Ambulatory Visit: Payer: Medicare Other | Admitting: Urology

## 2016-11-24 DIAGNOSIS — S31811A Laceration without foreign body of right buttock, initial encounter: Secondary | ICD-10-CM | POA: Diagnosis not present

## 2016-11-24 DIAGNOSIS — Z23 Encounter for immunization: Secondary | ICD-10-CM | POA: Diagnosis not present

## 2016-11-24 LAB — LACTATE DEHYDROGENASE: LDH: 166 IU/L (ref 121–224)

## 2016-11-24 NOTE — Telephone Encounter (Signed)
-----   Message from Hollice Espy, MD sent at 11/24/2016  8:19 AM EST ----- Doristine Devoid news.  LDH has normalized.  Looks like it was likely a lab error.  See you in a year.  Hollice Espy, MD

## 2016-11-24 NOTE — Telephone Encounter (Signed)
LMOM

## 2016-11-25 NOTE — Telephone Encounter (Signed)
Spoke with pt in reference to LDH results. Pt stated he is supposed to RTC in 2 weeks for more labs. Do you still want these?

## 2016-11-26 NOTE — Telephone Encounter (Signed)
No, see him next year.  No follow up lab needed.     Hollice Espy, MD

## 2016-11-28 NOTE — Telephone Encounter (Signed)
LMOM

## 2016-11-29 NOTE — Telephone Encounter (Signed)
LMOM

## 2016-11-30 NOTE — Telephone Encounter (Signed)
Spoke with pt in reference to lab appt. Pt voiced understanding. Lab appt canceled.

## 2016-12-05 DIAGNOSIS — Z049 Encounter for examination and observation for unspecified reason: Secondary | ICD-10-CM | POA: Diagnosis not present

## 2016-12-13 NOTE — Progress Notes (Signed)
12:01 PM  11/23/16  Matthew [name] [birth year] [id number]  Referring provider: Jerrol Banana., MD [address] [place], [place] [address]  Chief Complaint  Patient presents with  . 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 He returns today to review his most recent imaging and lab data.  Pathology for left inguinal radical orchiectomy from 07/02/14 shows mixed germ cell tumor with 40% embryonal, 40% teratoma, 5% yolk sac, and 5% seminoma. Tumor confined to testicle without LVI or local invasion of tunica vaginalis. Tumor markers negative.  CT abd/ pelvis 07/08/14 showed no evidence of metastatic disease or lymphadenopathy. Incidental 4 mm RLP stone identified. CXR with R basilar atelectasis, no evidence of mets. Follow up imaging in 11/06/14 showed soft tissue prominence in the left inguinal region just distal to the left inguinal ring measuring 1.5 x 1.2 cm ? enlarging LN vs. post operative changes.   Follow up 05/2015 showed stable soft tissue changes left inguinal canal most likely representing postoperative scarring.  Most recent imaging performed on 10/25/2016 was stable without any evidence of metastatic disease.   He did also have repeat tumor markers on 10/18/2016 which are within normal limits except for an elevated LDH, 308. Incidentally, this is not elevated preoperatively.  PMH: Past Medical History:  Diagnosis Date  . Allergic rhinitis, cause unspecified   . Benign neoplasm of colon   . Collapsed lung   . COPD (chronic obstructive pulmonary disease) ([place])   . ED (erectile dysfunction)   . GERD (gastroesophageal reflux disease)   . Hyperlipidemia   . Internal hemorrhoids without mention of complication   . OSA (obstructive sleep apnea)    wears CPAP  . Sleep apnea   . Testicular cancer ([place]) 07/2014  . Thyroid disease   . Unspecified  asthma(493.90)   . Unspecified sinusitis (chronic)     Surgical History: Past Surgical History:  Procedure Laterality Date  . COLONOSCOPY    . DENTAL SURGERY  12/2014  . INGUINAL HERNIA REPAIR    . NOSE SURGERY     submucous surgery  . POLYPECTOMY    . SINUS SURGERY WITH INSTATRAK    . SUBCLAVIAN BYPASS GRAFT  2009  . TESTICLE REMOVAL      Home Medications:  Allergies as of 11/23/2016   No Known Allergies     Medication List       Accurate as of 11/23/16 11:59 PM. Always use your most recent med list.          [place] NA Place into the nose as needed.   ALEVE 220 MG Caps Generic drug:  Naproxen Sodium as needed.   aspirin 81 MG tablet Take 81 mg by mouth daily.   atorvastatin 20 MG tablet Commonly known as:  LIPITOR Take 1 tablet (20 mg total) by mouth daily.   CENTRUM SILVER ADULT 50+ PO Take 1 tablet by mouth daily.   cyclobenzaprine 10 MG tablet Commonly known as:  FLEXERIL Take 1 tablet (10 mg total) by mouth 3 (three) times daily as needed for muscle spasms.   fluticasone 50 MCG/ACT nasal spray Commonly known as:  FLONASE Place 2 sprays into both nostrils daily.   folic acid A999333 MCG tablet Commonly known as:  FOLVITE Take 400 mcg by mouth daily.   ibuprofen 200 MG tablet Commonly known as:  ADVIL,MOTRIN Take  by mouth.   levalbuterol 45 MCG/ACT inhaler Commonly known as:  XOPENEX HFA Inhale 1-2 puffs into the lungs every 4 (four) hours as needed.   levothyroxine 75 MCG tablet Commonly known as:  SYNTHROID, LEVOTHROID Take 1 tablet (75 mcg total) by mouth daily.   loratadine 10 MG tablet Commonly known as:  CLARITIN Take 1 tablet (10 mg total) by mouth daily.   MUCINEX SINUS-MAX CONGESTION 5-325-200 MG Tabs Generic drug:  Phenylephrine-APAP-Guaifenesin Per bottle directions as needed   Potassium Acetate Powd 1 tablet by Does not apply route.   sildenafil 20 MG tablet Commonly known as:  REVATIO Take 2 to 4 tablets daily as  needed   SYMBICORT 160-4.5 MCG/ACT inhaler Generic drug:  budesonide-formoterol INHALE 1-2 PUFFS TWICE DAILY       Allergies: No Known Allergies  Family History: Family History  Problem Relation Age of Onset  . Colon cancer Sister   . Heart attack Sister   . Breast cancer Sister   . Esophageal cancer Neg Hx   . Stomach cancer Neg Hx   . Rectal cancer Neg Hx   . Breast cancer Sister   . Heart attack Sister     deceased from MI  . Allergies Father   . Heart attack Father   . CAD Father     deceased 10 from fall and possible subdural hematoma  . Congestive Heart Failure Mother   . Emphysema Mother     deceased 2 with emphysema, tobacco abuse  . Asthma Sister   . CAD Brother   . Stroke Brother   . Lung cancer Brother   . Arthritis Sister     Social History:  reports that he has never smoked. He has never used smokeless tobacco. He reports that he drinks about 4.2 oz of alcohol per week . He reports that he does not use drugs.  ROS: UROLOGY Frequent Urination?: No Hard to postpone urination?: No Burning/pain with urination?: No Get up at night to urinate?: No Leakage of urine?: No Urine stream starts and stops?: No Trouble starting stream?: No Do you have to strain to urinate?: No Blood in urine?: No Urinary tract infection?: No Sexually transmitted disease?: No Injury to kidneys or bladder?: No Painful intercourse?: No Weak stream?: No Erection problems?: No Penile pain?: No  Gastrointestinal Nausea?: No Vomiting?: No Indigestion/heartburn?: No Diarrhea?: No Constipation?: No  Constitutional Fever: No Night sweats?: Yes Weight loss?: No Fatigue?: Yes  Skin Skin rash/lesions?: No Itching?: No  Eyes Blurred vision?: No Double vision?: No  Ears/Nose/Throat Sore throat?: No Sinus problems?: Yes  Hematologic/Lymphatic Swollen glands?: No Easy bruising?: No  Cardiovascular Leg swelling?: No Chest pain?: No  Respiratory Cough?:  Yes Shortness of breath?: Yes  Endocrine Excessive thirst?: No  Musculoskeletal Back pain?: No Joint pain?: No  Neurological Headaches?: No Dizziness?: No  Psychologic Depression?: No Anxiety?: No  Physical Exam: BP 133/83   Pulse 80   Ht 6\' 1"  (1.854 m)   Wt 256 lb (116.1 kg)   BMI 33.78 kg/m   Constitutional:  Alert and oriented, No acute distress. HEENT: [place] AT, moist mucus membranes.  Trachea midline, no masses. Cardiovascular: No clubbing, cyanosis, or edema. Respiratory: Normal respiratory effort, no increased work of breathing. GI: Abdomen is soft, nontender, nondistended, no abdominal masses.   GU: GU exam deferred today, previously performed in 12/17 Skin: No rashes, bruises or suspicious lesions. Neurologic: Grossly intact, no focal deficits, moving all 4 extremities. Psychiatric: Normal mood and affect.  Laboratory Data: Lab Results  Component Value Date   WBC 6.3 02/17/2016   HGB 15.0 05/09/2014   HCT 43.2 02/17/2016   MCV 93 02/17/2016   PLT 225 02/17/2016    Lab Results  Component Value Date   CREATININE 0.90 10/25/2016    Pertinent Imaging: CLINICAL DATA:  Left testicular cancer. Status post left inguinal orchiectomy.  EXAM: CT ABDOMEN AND PELVIS WITH CONTRAST  TECHNIQUE: Multidetector CT imaging of the abdomen and pelvis was performed using the standard protocol following bolus administration of intravenous contrast.  CONTRAST:  117mL ISOVUE-300 IOPAMIDOL (ISOVUE-300) INJECTION 61%  COMPARISON:  05/21/2015  FINDINGS: Lower chest: Right middle lobe tiny nodule (image 7 series 4) is unchanged in the interval. Chronic atelectasis or scarring noted right lung base.  Hepatobiliary: No focal abnormality within the liver parenchyma. There is no evidence for gallstones, gallbladder wall thickening, or pericholecystic fluid. No intrahepatic or extrahepatic biliary dilation.  Pancreas: No focal mass lesion. No dilatation of the  main duct. No intraparenchymal cyst. No peripancreatic edema.  Spleen: No splenomegaly. No focal mass lesion.  Adrenals/Urinary Tract: No adrenal nodule or mass. 5 mm nonobstructing stone identified lower pole right kidney. Cortical scarring noted in the kidneys bilaterally. No hydroureteronephrosis. The urinary bladder appears normal for the degree of distention.  Stomach/Bowel: Stomach is nondistended. No gastric wall thickening. No evidence of outlet obstruction. Duodenum is normally positioned as is the ligament of Treitz. No small bowel wall thickening. No small bowel dilatation. The terminal ileum is normal. Diverticular changes are noted in the left colon without evidence of diverticulitis.  Vascular/Lymphatic: There is abdominal aortic atherosclerosis without aneurysm. There is no gastrohepatic or hepatoduodenal ligament lymphadenopathy. No intraperitoneal or retroperitoneal lymphadenopathy. No pelvic sidewall lymphadenopathy.  Reproductive: The prostate gland and seminal vesicles have normal imaging features.  Other: No intraperitoneal free fluid.  Musculoskeletal: Bone windows reveal no worrisome lytic or sclerotic osseous lesions. Right inguinal hernia contains only fat. The small soft tissue focus seen in the left inguinal region previously has decreased in the interval.  IMPRESSION: 1. Stable exam. No new or progressive findings. Specifically, no evidence for metastatic disease in the abdomen or pelvis. 2. Small soft tissue density seen the left inguinal canal on the previous study has decreased in the interval. 3. 5 mm nonobstructing right renal stone. No definite left renal stone identified on today's exam. 4. Abdominal aortic atherosclerosis.   Electronically Signed   By: Misty Stanley M.D.   On: 10/25/2016 12:49   CLINICAL DATA:  Cough and congestion. History of testicular carcinoma  EXAM: CHEST 1 VIEW  COMPARISON:  November 06, 2014  FINDINGS: There is stable elevation of the right hemidiaphragm with mild scarring in the right base. There is no edema or consolidation. The heart size and pulmonary vascularity are normal. No adenopathy. There is atherosclerotic calcification in the aorta. No bone lesions.  IMPRESSION: No edema or consolidation. Scarring right base with elevation of right hemidiaphragm. No new opacity. Stable cardiac silhouette. There is aortic atherosclerosis.   Electronically Signed   By: Lowella Grip III M.D.   On: 10/25/2016 12:19  Chest x-ray and CT scan images personally reviewed today.  Assessment & Plan:  70 year old male with stage IA mixed germ cell testicular cancer diagnosed 07/2014 on active surveillance protocol.  Per NCCN and guidelines,  imaging may be spread out to CT scan every 6 months - 1 year with chest x-ray annually.   NED on 12/17 with isolated elevated LDH.  Repeat labs drawn today, suspect possible lab error.    1. Non-seminomatous testicular cancer, left  As above Repeat LDH today- we will call with results We will plan for follow-up imaging in 1 year if no evidence of disease - CT Abdomen Pelvis W Wo Contrast; Future - Chest 1 View; Future - Lactate dehydrogenase - AFP tumor marker - Beta HCG, Quant (tumor marker)   F/u in 1 year with CXR/  CT/ CT abd/ eplivs  Hollice Espy, MD  [facility] [address] [place], [place] [address] [phone]

## 2016-12-20 ENCOUNTER — Telehealth: Payer: Self-pay | Admitting: Family Medicine

## 2016-12-20 NOTE — Telephone Encounter (Signed)
Called Pt to RE-schedule AWV with [facility] for 9am- knb

## 2017-01-12 NOTE — Telephone Encounter (Signed)
error 

## 2017-02-21 ENCOUNTER — Other Ambulatory Visit: Payer: Self-pay | Admitting: Family Medicine

## 2017-02-21 ENCOUNTER — Ambulatory Visit: Payer: Medicare Other

## 2017-02-21 ENCOUNTER — Encounter: Payer: Medicare Other | Admitting: Family Medicine

## 2017-03-22 ENCOUNTER — Encounter: Payer: Self-pay | Admitting: Internal Medicine

## 2017-03-23 ENCOUNTER — Ambulatory Visit: Payer: Medicare Other

## 2017-03-23 ENCOUNTER — Encounter: Payer: Medicare Other | Admitting: Family Medicine

## 2017-03-29 ENCOUNTER — Encounter: Payer: Self-pay | Admitting: Internal Medicine

## 2017-05-12 ENCOUNTER — Telehealth: Payer: Self-pay | Admitting: Urology

## 2017-05-12 NOTE — Telephone Encounter (Signed)
This patient has orders for a CXR and a CT scan but the CXR order has expired and the CT scan order will expire before the patient is scheduled to come back in December. Can you look into this? I am thinking that I will need new orders put in so that I can get these ordered for him prior to his appt.  Thanks,  Sharyn Lull

## 2017-05-17 ENCOUNTER — Ambulatory Visit (AMBULATORY_SURGERY_CENTER): Payer: Self-pay | Admitting: *Deleted

## 2017-05-17 ENCOUNTER — Other Ambulatory Visit: Payer: Self-pay

## 2017-05-17 ENCOUNTER — Encounter (INDEPENDENT_AMBULATORY_CARE_PROVIDER_SITE_OTHER): Payer: Self-pay

## 2017-05-17 VITALS — Ht 73.0 in | Wt 250.0 lb

## 2017-05-17 DIAGNOSIS — I10 Essential (primary) hypertension: Secondary | ICD-10-CM

## 2017-05-17 DIAGNOSIS — E785 Hyperlipidemia, unspecified: Secondary | ICD-10-CM

## 2017-05-17 DIAGNOSIS — Z800 Family history of malignant neoplasm of digestive organs: Secondary | ICD-10-CM

## 2017-05-17 DIAGNOSIS — E039 Hypothyroidism, unspecified: Secondary | ICD-10-CM

## 2017-05-17 DIAGNOSIS — Z86010 Personal history of colonic polyps: Secondary | ICD-10-CM

## 2017-05-17 MED ORDER — NA SULFATE-K SULFATE-MG SULF 17.5-3.13-1.6 GM/177ML PO SOLN: ORAL | 0 refills | Status: DC

## 2017-05-17 NOTE — Progress Notes (Signed)
Patient denies any allergies to eggs or soy. Patient denies any problems with anesthesia/sedation. Patient denies any oxygen use at home and does not take any diet/weight loss medications. EMMI education assisgned to patient on colonoscopy, this was explained and instructions given to patient. 

## 2017-05-18 ENCOUNTER — Encounter: Payer: Self-pay | Admitting: Internal Medicine

## 2017-05-18 LAB — CBC WITH DIFFERENTIAL/PLATELET
Basophils Absolute: 0 10*3/uL (ref 0.0–0.2)
Basos: 0 %
EOS (ABSOLUTE): 0.5 10*3/uL — ABNORMAL HIGH (ref 0.0–0.4)
Eos: 8 %
Hematocrit: 43.6 % (ref 37.5–51.0)
Hemoglobin: 14.5 g/dL (ref 13.0–17.7)
Immature Grans (Abs): 0 10*3/uL (ref 0.0–0.1)
Immature Granulocytes: 0 %
Lymphocytes Absolute: 1.3 10*3/uL (ref 0.7–3.1)
Lymphs: 23 %
MCH: 31.5 pg (ref 26.6–33.0)
MCHC: 33.3 g/dL (ref 31.5–35.7)
MCV: 95 fL (ref 79–97)
Monocytes Absolute: 0.7 10*3/uL (ref 0.1–0.9)
Monocytes: 12 %
Neutrophils Absolute: 3.4 10*3/uL (ref 1.4–7.0)
Neutrophils: 57 %
Platelets: 210 10*3/uL (ref 150–379)
RBC: 4.6 x10E6/uL (ref 4.14–5.80)
RDW: 13.8 % (ref 12.3–15.4)
WBC: 5.9 10*3/uL (ref 3.4–10.8)

## 2017-05-18 LAB — COMPREHENSIVE METABOLIC PANEL
ALT: 29 IU/L (ref 0–44)
AST: 25 IU/L (ref 0–40)
Albumin/Globulin Ratio: 1.8 (ref 1.2–2.2)
Albumin: 4.4 g/dL (ref 3.5–4.8)
Alkaline Phosphatase: 54 IU/L (ref 39–117)
BUN/Creatinine Ratio: 26 — ABNORMAL HIGH (ref 10–24)
BUN: 25 mg/dL (ref 8–27)
Bilirubin Total: 0.8 mg/dL (ref 0.0–1.2)
CO2: 26 mmol/L (ref 20–29)
Calcium: 9.6 mg/dL (ref 8.6–10.2)
Chloride: 103 mmol/L (ref 96–106)
Creatinine, Ser: 0.96 mg/dL (ref 0.76–1.27)
GFR calc Af Amer: 92 mL/min/{1.73_m2} (ref >59)
GFR calc non Af Amer: 79 mL/min/{1.73_m2} (ref >59)
Globulin, Total: 2.4 g/dL (ref 1.5–4.5)
Glucose: 96 mg/dL (ref 65–99)
Potassium: 4.9 mmol/L (ref 3.5–5.2)
Sodium: 144 mmol/L (ref 134–144)
Total Protein: 6.8 g/dL (ref 6.0–8.5)

## 2017-05-18 LAB — PLEASE NOTE

## 2017-05-18 LAB — LIPID PANEL WITH LDL/HDL RATIO
Cholesterol, Total: 138 mg/dL (ref 100–199)
HDL: 43 mg/dL (ref >39)
LDL Calculated: 66 mg/dL (ref 0–99)
LDl/HDL Ratio: 1.5 ratio (ref 0.0–3.6)
Triglycerides: 146 mg/dL (ref 0–149)
VLDL Cholesterol Cal: 29 mg/dL (ref 5–40)

## 2017-05-18 LAB — TSH: TSH: 2.82 u[IU]/mL (ref 0.450–4.500)

## 2017-05-23 NOTE — Telephone Encounter (Signed)
Orders placed will expire in dec which should work for patient's December follow up

## 2017-05-24 ENCOUNTER — Ambulatory Visit (INDEPENDENT_AMBULATORY_CARE_PROVIDER_SITE_OTHER): Payer: Medicare Other

## 2017-05-24 ENCOUNTER — Ambulatory Visit (INDEPENDENT_AMBULATORY_CARE_PROVIDER_SITE_OTHER): Payer: Medicare Other | Admitting: Family Medicine

## 2017-05-24 VITALS — BP 132/84 | HR 64 | Temp 99.0°F | Wt 254.0 lb

## 2017-05-24 VITALS — BP 132/84 | HR 64 | Temp 99.0°F | Ht 73.0 in | Wt 254.0 lb

## 2017-05-24 DIAGNOSIS — H029 Unspecified disorder of eyelid: Secondary | ICD-10-CM

## 2017-05-24 DIAGNOSIS — Z0000 Encounter for general adult medical examination without abnormal findings: Secondary | ICD-10-CM | POA: Diagnosis not present

## 2017-05-24 DIAGNOSIS — Z6833 Body mass index (BMI) 33.0-33.9, adult: Secondary | ICD-10-CM | POA: Diagnosis not present

## 2017-05-24 DIAGNOSIS — Z23 Encounter for immunization: Secondary | ICD-10-CM | POA: Diagnosis not present

## 2017-05-24 NOTE — Progress Notes (Signed)
Subjective:   Matthew [name] is a 71 y.o. male who presents for Medicare Annual/Subsequent preventive examination.  Review of Systems:  N/A  Cardiac Risk Factors include: advanced age (>65men, >2 women);dyslipidemia;hypertension;male gender;obesity (BMI >30kg/m2);sedentary lifestyle     Objective:    Vitals: BP 132/84 (BP Location: Left Arm)   Pulse 64   Temp 99 F (37.2 C) (Oral)   Ht 6\' 1"  (1.854 m)   Wt 254 lb (115.2 kg)   BMI 33.51 kg/m   Body mass index is 33.51 kg/m.  Tobacco History  Smoking Status  . Never Smoker  Smokeless Tobacco  . Never Used     Counseling given: Not Answered   Past Medical History:  Diagnosis Date  . Allergic rhinitis, cause unspecified   . Benign neoplasm of colon   . Collapsed lung   . COPD (chronic obstructive pulmonary disease) ([place])   . ED (erectile dysfunction)   . GERD (gastroesophageal reflux disease)   . Hyperlipidemia   . Internal hemorrhoids without mention of complication   . OSA (obstructive sleep apnea)    wears CPAP  . Sleep apnea    no CPAP  . Testicular cancer ([place]) 07/2014  . Thyroid disease   . Unspecified asthma(493.90)   . Unspecified sinusitis (chronic)    Past Surgical History:  Procedure Laterality Date  . COLONOSCOPY    . DENTAL SURGERY  12/2014  . INGUINAL HERNIA REPAIR    . NOSE SURGERY     submucous surgery  . POLYPECTOMY    . SINUS SURGERY WITH INSTATRAK    . SUBCLAVIAN BYPASS GRAFT  2009  . TESTICLE REMOVAL     Family History  Problem Relation Age of Onset  . Colon cancer Sister 44  . Breast cancer Sister   . Heart attack Sister   . Heart attack Sister        deceased from MI  . Allergies Father   . Heart attack Father   . CAD Father        deceased 76 from fall and possible subdural hematoma  . Congestive Heart Failure Mother   . Emphysema Mother        deceased 67 with emphysema, tobacco abuse  . Asthma Sister   . CAD Brother   . Stroke Brother   . Lung cancer Brother     . Arthritis Sister   . Esophageal cancer Neg Hx   . Stomach cancer Neg Hx   . Rectal cancer Neg Hx    History  Sexual Activity  . Sexual activity: Not on file    Outpatient Encounter Prescriptions as of 05/24/2017  Medication Sig  . aspirin 81 MG tablet Take 81 mg by mouth daily.  Marland Kitchen atorvastatin (LIPITOR) 20 MG tablet Take 1 tablet (20 mg total) by mouth daily.  . fluticasone (FLONASE) 50 MCG/ACT nasal spray Place 2 sprays into both nostrils daily. (Patient taking differently: Place 2 sprays into both nostrils daily. )  . folic acid (FOLVITE) 865 MCG tablet Take 400 mcg by mouth daily.  Marland Kitchen ibuprofen (ADVIL,MOTRIN) 200 MG tablet Take 200 mg by mouth every 4 (four) hours as needed.   . levalbuterol (XOPENEX HFA) 45 MCG/ACT inhaler Inhale 1-2 puffs into the lungs every 4 (four) hours as needed.  Marland Kitchen levothyroxine (SYNTHROID, LEVOTHROID) 75 MCG tablet Take 1 tablet (75 mcg total) by mouth daily.  . meloxicam (MOBIC) 15 MG tablet Take 15 mg by mouth daily.  . Multiple Vitamins-Minerals (CENTRUM SILVER ADULT  50+ PO) Take 1 tablet by mouth daily.  . Naproxen Sodium (ALEVE) 220 MG CAPS as needed.  . Oxymetazoline HCl (AFRIN NASAL SPRAY NA) Place into the nose as needed.    Marland Kitchen Phenylephrine-APAP-Guaifenesin (MUCINEX SINUS-MAX CONGESTION) 5-325-200 MG TABS Per bottle directions as needed  . Potassium 75 MG TABS Take by mouth daily.  . sildenafil (REVATIO) 20 MG tablet Take 2 to 4 tablets daily as needed  . SYMBICORT 160-4.5 MCG/ACT inhaler INHALE 1-2 PUFFS TWICE DAILY  . Na Sulfate-K Sulfate-Mg Sulf 17.5-3.13-1.6 GM/180ML SOLN Suprep (no substitutions)-TAKE AS DIRECTED. (Patient not taking: Reported on 05/24/2017)  . [DISCONTINUED] loratadine (CLARITIN) 10 MG tablet Take 1 tablet (10 mg total) by mouth daily.  . [DISCONTINUED] Potassium Acetate POWD 1 tablet by Does not apply route.   No facility-administered encounter medications on file as of 05/24/2017.     Activities of Daily Living In your  present state of health, do you have any difficulty performing the following activities: 05/24/2017  Hearing? Y  Vision? Y  Difficulty concentrating or making decisions? N  Walking or climbing stairs? Y  Dressing or bathing? N  Doing errands, shopping? N  Preparing Food and eating ? N  Using the Toilet? N  In the past six months, have you accidently leaked urine? N  Do you have problems with loss of bowel control? N  Managing your Medications? N  Managing your Finances? N  Housekeeping or managing your Housekeeping? N  Some recent data might be hidden    Patient Care Team: Jerrol Banana., MD as PCP - General (Unknown Physician Specialty)   Assessment:     Exercise Activities and Dietary recommendations Current Exercise Habits: The patient does not participate in regular exercise at present, Exercise limited by: None identified;Other - see comments (busy)  Goals    . Increase water intake          Recommend increasing water intake to 4-6 glasses a day.       Fall Risk Fall Risk  05/24/2017 02/17/2016  Falls in the past year? No No   Depression Screen PHQ 2/9 Scores 05/24/2017 05/24/2017 02/17/2016  PHQ - 2 Score 0 0 0  PHQ- 9 Score 5 - -    Cognitive Function     6CIT Screen 05/24/2017  What Year? 0 points  What month? 0 points  What time? 3 points  Count back from 20 0 points  Months in reverse 0 points  Repeat phrase 0 points  Total Score 3    Immunization History  Administered Date(s) Administered  . Influenza Whole 07/31/2012  . Influenza, High Dose Seasonal PF 10/10/2016  . Pneumococcal Conjugate-13 08/04/2014  . Pneumococcal Polysaccharide-23 05/24/2017  . Td 07/30/2004  . Tdap 08/04/2014, 11/24/2016   Screening Tests Health Maintenance  Topic Date Due  . COLONOSCOPY  05/31/2017 (Originally 05/24/2017)  . INFLUENZA VACCINE  05/31/2017  . TETANUS/TDAP  08/04/2024  . Hepatitis C Screening  Completed  . PNA vac Low Risk Adult  Completed        Plan:  I have personally reviewed and addressed the Medicare Annual Wellness questionnaire and have noted the following in the patient's chart:  A. Medical and social history B. Use of alcohol, tobacco or illicit drugs  C. Current medications and supplements D. Functional ability and status E.  Nutritional status F.  Physical activity G. Advance directives H. List of other physicians I.  Hospitalizations, surgeries, and ER visits in previous 12 months J.  Vitals K. Screenings such as hearing and vision if needed, cognitive and depression L. Referrals and appointments - none  In addition, I have reviewed and discussed with patient certain preventive protocols, quality metrics, and best practice recommendations. A written personalized care plan for preventive services as well as general preventive health recommendations were provided to patient.  See attached scanned questionnaire for additional information.   Signed,  Fabio Neighbors, LPN Nurse Health Advisor   MD Recommendations: None. Pt has colonoscopy set up for next week.

## 2017-05-24 NOTE — Patient Instructions (Signed)
[name] , Thank you for taking time to come for your Medicare Wellness Visit. I appreciate your ongoing commitment to your health goals. Please review the following plan we discussed and let me know if I can assist you in the future.   Screening recommendations/referrals: Colonoscopy: completed 05/25/15 Recommended yearly ophthalmology/optometry visit for glaucoma screening and checkup Recommended yearly dental visit for hygiene and checkup  Vaccinations: Influenza vaccine: due 07/2017 Pneumococcal vaccine: Prevnar 13 given 08/04/14,  Tdap vaccine: completed 08/04/14, due 07/2024 Shingles vaccine: completed per pt    Advanced directives: Please bring a copy of your POA (Power of [place]) and/or Living Will to your next appointment.   Conditions/risks identified: Obesity; Recommend increasing water intake to 4-6 glasses a day.   Next appointment: None, need to schedule 1 year AWV  Preventive Care 71 Years and Older, Male Preventive care refers to lifestyle choices and visits with your health care provider that can promote health and wellness. What does preventive care include?  A yearly physical exam. This is also called an annual well check.  Dental exams once or twice a year.  Routine eye exams. Ask your health care provider how often you should have your eyes checked.  Personal lifestyle choices, including:  Daily care of your teeth and gums.  Regular physical activity.  Eating a healthy diet.  Avoiding tobacco and drug use.  Limiting alcohol use.  Practicing safe sex.  Taking low doses of aspirin every day.  Taking vitamin and mineral supplements as recommended by your health care provider. What happens during an annual well check? The services and screenings done by your health care provider during your annual well check will depend on your age, overall health, lifestyle risk factors, and family history of disease. Counseling  Your health care provider may  ask you questions about your:  Alcohol use.  Tobacco use.  Drug use.  Emotional well-being.  Home and relationship well-being.  Sexual activity.  Eating habits.  History of falls.  Memory and ability to understand (cognition).  Work and work Statistician. Screening  You may have the following tests or measurements:  Height, weight, and BMI.  Blood pressure.  Lipid and cholesterol levels. These may be checked every 5 years, or more frequently if you are over 42 years old.  Skin check.  Lung cancer screening. You may have this screening every year starting at age 71 if you have a 30-pack-year history of smoking and currently smoke or have quit within the past 15 years.  Fecal occult blood test (FOBT) of the stool. You may have this test every year starting at age 71.  Flexible sigmoidoscopy or colonoscopy. You may have a sigmoidoscopy every 5 years or a colonoscopy every 10 years starting at age 71.  Prostate cancer screening. Recommendations will vary depending on your family history and other risks.  Hepatitis C blood test.  Hepatitis B blood test.  Sexually transmitted disease (STD) testing.  Diabetes screening. This is done by checking your blood sugar (glucose) after you have not eaten for a while (fasting). You may have this done every 1-3 years.  Abdominal aortic aneurysm (AAA) screening. You may need this if you are a current or former smoker.  Osteoporosis. You may be screened starting at age 71 if you are at high risk. Talk with your health care provider about your test results, treatment options, and if necessary, the need for more tests. Vaccines  Your health care provider may recommend certain vaccines, such as:  Influenza vaccine. This is recommended every year.  Tetanus, diphtheria, and acellular pertussis (Tdap, Td) vaccine. You may need a Td booster every 10 years.  Zoster vaccine. You may need this after age 55.  Pneumococcal 13-valent  conjugate (PCV13) vaccine. One dose is recommended after age 71.  Pneumococcal polysaccharide (PPSV23) vaccine. One dose is recommended after age 71. Talk to your health care provider about which screenings and vaccines you need and how often you need them. This information is not intended to replace advice given to you by your health care provider. Make sure you discuss any questions you have with your health care provider. Document Released: 11/13/2015 Document Revised: 07/06/2016 Document Reviewed: 08/18/2015 Elsevier Interactive Patient Education  2017 [place] Prevention in the Home Falls can cause injuries. They can happen to people of all ages. There are many things you can do to make your home safe and to help prevent falls. What can I do on the outside of my home?  Regularly fix the edges of walkways and driveways and fix any cracks.  Remove anything that might make you trip as you walk through a door, such as a raised step or threshold.  Trim any bushes or trees on the path to your home.  Use bright outdoor lighting.  Clear any walking paths of anything that might make someone trip, such as rocks or tools.  Regularly check to see if handrails are loose or broken. Make sure that both sides of any steps have handrails.  Any raised decks and porches should have guardrails on the edges.  Have any leaves, snow, or ice cleared regularly.  Use sand or salt on walking paths during winter.  Clean up any spills in your garage right away. This includes oil or grease spills. What can I do in the bathroom?  Use night lights.  Install grab bars by the toilet and in the tub and shower. Do not use towel bars as grab bars.  Use non-skid mats or decals in the tub or shower.  If you need to sit down in the shower, use a plastic, non-slip stool.  Keep the floor dry. Clean up any water that spills on the floor as soon as it happens.  Remove soap buildup in the tub or  shower regularly.  Attach bath mats securely with double-sided non-slip rug tape.  Do not have throw rugs and other things on the floor that can make you trip. What can I do in the bedroom?  Use night lights.  Make sure that you have a light by your bed that is easy to reach.  Do not use any sheets or blankets that are too big for your bed. They should not hang down onto the floor.  Have a firm chair that has side arms. You can use this for support while you get dressed.  Do not have throw rugs and other things on the floor that can make you trip. What can I do in the kitchen?  Clean up any spills right away.  Avoid walking on wet floors.  Keep items that you use a lot in easy-to-reach places.  If you need to reach something above you, use a strong step stool that has a grab bar.  Keep electrical cords out of the way.  Do not use floor polish or wax that makes floors slippery. If you must use wax, use non-skid floor wax.  Do not have throw rugs and other things on the floor that  can make you trip. What can I do with my stairs?  Do not leave any items on the stairs.  Make sure that there are handrails on both sides of the stairs and use them. Fix handrails that are broken or loose. Make sure that handrails are as long as the stairways.  Check any carpeting to make sure that it is firmly attached to the stairs. Fix any carpet that is loose or worn.  Avoid having throw rugs at the top or bottom of the stairs. If you do have throw rugs, attach them to the floor with carpet tape.  Make sure that you have a light switch at the top of the stairs and the bottom of the stairs. If you do not have them, ask someone to add them for you. What else can I do to help prevent falls?  Wear shoes that:  Do not have high heels.  Have rubber bottoms.  Are comfortable and fit you well.  Are closed at the toe. Do not wear sandals.  If you use a stepladder:  Make sure that it is fully  opened. Do not climb a closed stepladder.  Make sure that both sides of the stepladder are locked into place.  Ask someone to hold it for you, if possible.  Clearly mark and make sure that you can see:  Any grab bars or handrails.  First and last steps.  Where the edge of each step is.  Use tools that help you move around (mobility aids) if they are needed. These include:  Canes.  Walkers.  Scooters.  Crutches.  Turn on the lights when you go into a dark area. Replace any light bulbs as soon as they burn out.  Set up your furniture so you have a clear path. Avoid moving your furniture around.  If any of your floors are uneven, fix them.  If there are any pets around you, be aware of where they are.  Review your medicines with your doctor. Some medicines can make you feel dizzy. This can increase your chance of falling. Ask your doctor what other things that you can do to help prevent falls. This information is not intended to replace advice given to you by your health care provider. Make sure you discuss any questions you have with your health care provider. Document Released: 08/13/2009 Document Revised: 03/24/2016 Document Reviewed: 11/21/2014 Elsevier Interactive Patient Education  2017 [employer].

## 2017-05-24 NOTE — Progress Notes (Signed)
Patient: Matthew [name], Male    DOB: 1946/04/30, 71 y.o.   MRN: [id number] Visit Date: 05/24/2017  Today's Provider: Wilhemena Durie, MD   Chief Complaint  Patient presents with  . Annual Exam    AWE done by [facility] prior to this visit   Subjective:  Matthew [name] is a 71 y.o. male who presents today for health maintenance and complete physical. He feels well. He reports exercising none. He reports he is sleeping well.  Immunization History  Administered Date(s) Administered  . Influenza Whole 07/31/2012  . Influenza, High Dose Seasonal PF 10/10/2016  . Pneumococcal Conjugate-13 08/04/2014  . Pneumococcal Polysaccharide-23 05/24/2017  . Td 07/30/2004  . Tdap 08/04/2014, 11/24/2016   05/25/15 Colonoscopy-moderate diverticulosis, polyp,benign.  Patient to repeat in 2 years due to history.  Patient is scheduled next week.    Review of Systems  Constitutional: Negative.   HENT: Positive for congestion, sinus pressure and tinnitus.   Eyes: Negative.   Respiratory: Positive for cough and shortness of breath.   Cardiovascular: Negative.   Gastrointestinal: Negative.   Endocrine: Negative.   Genitourinary: Negative.   Musculoskeletal: Positive for back pain.  Skin: Negative.   Allergic/Immunologic: Negative.   Neurological: Positive for headaches.  Hematological: Negative.   Psychiatric/Behavioral: Negative.    Patient is not currently having an issue with any of these symptoms, he listed them on his form as something he has at some time had.  Nothing to be addressed today per the patient.    Social History   Social History  . Marital status: Married    Spouse name: N/A  . Number of children: 2  . Years of education: N/A   Occupational History  . retired     Architect   Social History Main Topics  . Smoking status: Never Smoker  . Smokeless tobacco: Never Used  . Alcohol use 10.2 oz/week    10 Shots of liquor, 7 Standard drinks or equivalent per week      Comment: or less  . Drug use: No  . Sexual activity: Not on file   Other Topics Concern  . Not on file   Social History Narrative  . No narrative on file    Patient Active Problem List   Diagnosis Date Noted  . Non-seminomatous testicular cancer ([place]) 05/22/2015  . Arteriosclerosis of coronary artery 05/14/2015  . Allergic rhinitis 03/11/2015  . Airway hyperreactivity 03/11/2015  . Benign essential tremor 03/11/2015  . Benign neoplasm of colon 03/11/2015  . Failure of erection 03/11/2015  . Esophagitis, reflux 03/11/2015  . Essential (primary) hypertension 03/11/2015  . Gastro-esophageal reflux disease without esophagitis 03/11/2015  . HLD (hyperlipidemia) 03/11/2015  . Cannot sleep 03/11/2015  . Adult hypothyroidism 03/11/2015  . Chronic airway obstruction ([place]) 03/11/2015  . Acid reflux 05/07/2014  . Cough 09/06/2012  . Shortness of breath 09/04/2012  . OSA (obstructive sleep apnea) 09/04/2012    Past Surgical History:  Procedure Laterality Date  . COLONOSCOPY    . DENTAL SURGERY  12/2014  . INGUINAL HERNIA REPAIR    . NOSE SURGERY     submucous surgery  . POLYPECTOMY    . SINUS SURGERY WITH INSTATRAK    . SUBCLAVIAN BYPASS GRAFT  2009  . TESTICLE REMOVAL      His family history includes Allergies in his father; Arthritis in his sister; Asthma in his sister; Breast cancer in his sister; CAD in his brother and father; Colon cancer (age of onset: 34) in  his sister; Congestive Heart Failure in his mother; Emphysema in his mother; Heart attack in his father, sister, and sister; Lung cancer in his brother; Stroke in his brother.     Outpatient Encounter Prescriptions as of 05/24/2017  Medication Sig Note  . aspirin 81 MG tablet Take 81 mg by mouth daily.   Marland Kitchen atorvastatin (LIPITOR) 20 MG tablet Take 1 tablet (20 mg total) by mouth daily.   . fluticasone (FLONASE) 50 MCG/ACT nasal spray Place 2 sprays into both nostrils daily. (Patient taking differently: Place 2 sprays  into both nostrils daily. )   . folic acid (FOLVITE) 250 MCG tablet Take 400 mcg by mouth daily.   Marland Kitchen ibuprofen (ADVIL,MOTRIN) 200 MG tablet Take 200 mg by mouth every 4 (four) hours as needed.  04/10/2015: Received from: [facility]  . levalbuterol (XOPENEX HFA) 45 MCG/ACT inhaler Inhale 1-2 puffs into the lungs every 4 (four) hours as needed.   Marland Kitchen levothyroxine (SYNTHROID, LEVOTHROID) 75 MCG tablet Take 1 tablet (75 mcg total) by mouth daily.   . meloxicam (MOBIC) 15 MG tablet Take 15 mg by mouth daily.   . Multiple Vitamins-Minerals (CENTRUM SILVER ADULT 50+ PO) Take 1 tablet by mouth daily.   . Na Sulfate-K Sulfate-Mg Sulf 17.5-3.13-1.6 GM/180ML SOLN Suprep (no substitutions)-TAKE AS DIRECTED.   . Naproxen Sodium (ALEVE) 220 MG CAPS as needed.   . Oxymetazoline HCl (AFRIN NASAL SPRAY NA) Place into the nose as needed.     Marland Kitchen Phenylephrine-APAP-Guaifenesin (MUCINEX SINUS-MAX CONGESTION) 5-325-200 MG TABS Per bottle directions as needed   . Potassium 75 MG TABS Take by mouth daily.   . sildenafil (REVATIO) 20 MG tablet Take 2 to 4 tablets daily as needed   . SYMBICORT 160-4.5 MCG/ACT inhaler INHALE 1-2 PUFFS TWICE DAILY    No facility-administered encounter medications on file as of 05/24/2017.     Patient Care Team: Jerrol Banana., MD as PCP - General (Unknown Physician Specialty)      Objective:   Vitals:  Vitals:   05/24/17 0942  BP: 132/84  Pulse: 64  Temp: 99 F (37.2 C)  TempSrc: Oral  Weight: 254 lb (115.2 kg)    Physical Exam  Constitutional: He is oriented to person, place, and time. He appears well-developed and well-nourished.  HENT:  Head: Normocephalic and atraumatic.  Right Ear: External ear normal.  Left Ear: External ear normal.  Nose: Nose normal.  Mouth/Throat: Oropharynx is clear and moist.  Eyes: Pupils are equal, round, and reactive to light. Conjunctivae and EOM are normal.  Neck: Normal range of motion. Neck supple.   Cardiovascular: Normal rate, regular rhythm, normal heart sounds and intact distal pulses.   Pulmonary/Chest: Effort normal and breath sounds normal.  Abdominal: Soft. Bowel sounds are normal.  Genitourinary: Penis normal.  Musculoskeletal: Normal range of motion.  Neurological: He is alert and oriented to person, place, and time.  Skin: Skin is warm and dry.  Psychiatric: He has a normal mood and affect. His behavior is normal. Judgment and thought content normal.   Fall Risk  05/24/2017 02/17/2016  Falls in the past year? No No     Depression Screen PHQ 2/9 Scores 05/24/2017 05/24/2017 02/17/2016  PHQ - 2 Score 0 0 0  PHQ- 9 Score 5 - -   Functional Status Survey: Is the patient deaf or have difficulty hearing?: Yes Does the patient have difficulty seeing, even when wearing glasses/contacts?: Yes Does the patient have difficulty concentrating, remembering, or making  decisions?: No Does the patient have difficulty walking or climbing stairs?: Yes Does the patient have difficulty dressing or bathing?: No Does the patient have difficulty doing errands alone such as visiting a doctor's office or shopping?: No  Home Exercise  05/24/2017  Current Exercise Habits The patient does not participate in regular exercise at present  Exercise limited by: None identified;Other - see comments   6CIT Screen 05/24/2017  What Year? 0 points  What month? 0 points  What time? 3 points  Count back from 20 0 points  Months in reverse 0 points  Repeat phrase 0 points  Total Score 3     Assessment & Plan:     Routine Health Maintenance and Physical Exam  Exercise Activities and Dietary recommendations Goals    . Increase water intake          Recommend increasing water intake to 4-6 glasses a day.        Immunization History  Administered Date(s) Administered  . Influenza Whole 07/31/2012  . Influenza, High Dose Seasonal PF 10/10/2016  . Pneumococcal Conjugate-13 08/04/2014  .  Pneumococcal Polysaccharide-23 05/24/2017  . Td 07/30/2004  . Tdap 08/04/2014, 11/24/2016    Health Maintenance  Topic Date Due  . COLONOSCOPY  05/31/2017 (Originally 05/24/2017)  . INFLUENZA VACCINE  05/31/2017  . TETANUS/TDAP  08/04/2024  . Hepatitis C Screening  Completed  . PNA vac Low Risk Adult  Completed     Discussed health benefits of physical activity, and encouraged him to engage in regular exercise appropriate for his age and condition.     HPI, Exam and A&P Transcribed under the direction and in the presence of Miguel Aschoff, Brooke Bonito., MD. Electronically Signed: Althea Charon, RMA I have done the exam and reviewed the chart and it is accurate to the best of my knowledge. Development worker, community has been used and  any errors in dictation or transcription are unintentional. Miguel Aschoff M.D. [facility]

## 2017-05-25 DIAGNOSIS — H029 Unspecified disorder of eyelid: Secondary | ICD-10-CM | POA: Diagnosis not present

## 2017-05-31 ENCOUNTER — Ambulatory Visit (AMBULATORY_SURGERY_CENTER): Payer: Medicare Other | Admitting: Internal Medicine

## 2017-05-31 ENCOUNTER — Encounter: Payer: Self-pay | Admitting: Internal Medicine

## 2017-05-31 VITALS — BP 129/69 | HR 66 | Temp 97.7°F | Resp 19 | Ht 73.0 in | Wt 250.0 lb

## 2017-05-31 DIAGNOSIS — Z86010 Personal history of colonic polyps: Secondary | ICD-10-CM | POA: Diagnosis present

## 2017-05-31 DIAGNOSIS — K219 Gastro-esophageal reflux disease without esophagitis: Secondary | ICD-10-CM | POA: Diagnosis not present

## 2017-05-31 MED ORDER — SODIUM CHLORIDE 0.9 % IV SOLN: 500.0000 mL | INTRAVENOUS | Status: AC

## 2017-05-31 NOTE — Patient Instructions (Signed)
YOU HAD AN ENDOSCOPIC PROCEDURE TODAY AT THE [facility]:   Refer to the procedure report that was given to you for any specific questions about what was found during the examination.  If the procedure report does not answer your questions, please call your gastroenterologist to clarify.  If you requested that your care partner not be given the details of your procedure findings, then the procedure report has been included in a sealed envelope for you to review at your convenience later.  YOU SHOULD EXPECT: Some feelings of bloating in the abdomen. Passage of more gas than usual.  Walking can help get rid of the air that was put into your GI tract during the procedure and reduce the bloating. If you had a lower endoscopy (such as a colonoscopy or flexible sigmoidoscopy) you may notice spotting of blood in your stool or on the toilet paper. If you underwent a bowel prep for your procedure, you may not have a normal bowel movement for a few days.  Please Note:  You might notice some irritation and congestion in your nose or some drainage.  This is from the oxygen used during your procedure.  There is no need for concern and it should clear up in a day or so.  SYMPTOMS TO REPORT IMMEDIATELY:   Following lower endoscopy (colonoscopy or flexible sigmoidoscopy):  Excessive amounts of blood in the stool  Significant tenderness or worsening of abdominal pains  Swelling of the abdomen that is new, acute  Fever of 100F or higher  For urgent or emergent issues, a gastroenterologist can be reached at any hour by calling [phone].   DIET:  We do recommend a small meal at first, but then you may proceed to your regular diet.  Drink plenty of fluids but you should avoid alcoholic beverages for 24 hours.  MEDICATIONS: Continue present medications.  Please see handouts given to you by your recovery nurse.  ACTIVITY:  You should plan to take it easy for the rest of today and you should NOT  DRIVE or use heavy machinery until tomorrow (because of the sedation medicines used during the test).    FOLLOW UP: Our staff will call the number listed on your records the next business day following your procedure to check on you and address any questions or concerns that you may have regarding the information given to you following your procedure. If we do not reach you, we will leave a message.  However, if you are feeling well and you are not experiencing any problems, there is no need to return our call.  We will assume that you have returned to your regular daily activities without incident.  If any biopsies were taken you will be contacted by phone or by letter within the next 1-3 weeks.  Please call us at [phone] if you have not heard about the biopsies in 3 weeks.   Thank you for allowing us to provide for your healthcare needs today.   SIGNATURES/CONFIDENTIALITY: You and/or your care partner have signed paperwork which will be entered into your electronic medical record.  These signatures attest to the fact that that the information above on your After Visit Summary has been reviewed and is understood.  Full responsibility of the confidentiality of this discharge information lies with you and/or your care-partner. 

## 2017-05-31 NOTE — Op Note (Signed)
[place] Patient Name: Matthew [name] Procedure Date: 05/31/2017 2:03 PM MRN: [id number] Endoscopist: Docia Chuck. Henrene Pastor , MD Age: 71 Referring MD:  Date of Birth: Nov 30, 1945 Gender: Male Account #: [id number] Procedure:                Colonoscopy Indications:              High risk colon cancer surveillance: Personal                            history of adenoma with villous component, High                            risk colon cancer surveillance: Personal history of                            multiple (3 or more) adenomas, High risk colon                            cancer surveillance: Personal history of colon                            cancer(malignant colon polyp). Multiple prior                            colonoscopies 2002, 2003, 2006, 2008, 2010, 2012,                            2014 2016. Sr. with a history of colon cancer Medicines:                Monitored Anesthesia Care Procedure:                Pre-Anesthesia Assessment:                           - Prior to the procedure, a History and Physical                            was performed, and patient medications and                            allergies were reviewed. The patient's tolerance of                            previous anesthesia was also reviewed. The risks                            and benefits of the procedure and the sedation                            options and risks were discussed with the patient.                            All questions were answered, and informed consent  was obtained. Prior Anticoagulants: The patient has                            taken no previous anticoagulant or antiplatelet                            agents. ASA Grade Assessment: II - A patient with                            mild systemic disease. After reviewing the risks                            and benefits, the patient was deemed in                            satisfactory condition to  undergo the procedure.                           After obtaining informed consent, the colonoscope                            was passed under direct vision. Throughout the                            procedure, the patient's blood pressure, pulse, and                            oxygen saturations were monitored continuously. The                            Colonoscope was introduced through the anus and                            advanced to the the cecum, identified by                            appendiceal orifice and ileocecal valve. The                            ileocecal valve, appendiceal orifice, and rectum                            were photographed. The quality of the bowel                            preparation was excellent. The colonoscopy was                            performed without difficulty. The patient tolerated                            the procedure well. The bowel preparation used was  SUPREP. Scope In: 2:13:09 PM Scope Out: 2:23:46 PM Scope Withdrawal Time: 0 hours 9 minutes 2 seconds  Total Procedure Duration: 0 hours 10 minutes 37 seconds  Findings:                 Multiple diverticula were found in the left colon,                            and a few in the right colon.                           Internal hemorrhoids were found during                            retroflexion. The hemorrhoids were small.                           The exam was otherwise without abnormality on                            direct and retroflexion views. Complications:            No immediate complications. Estimated blood loss:                            None. Estimated Blood Loss:     Estimated blood loss: none. Impression:               - Diverticulosis in the left and right colon.                           - Internal hemorrhoids.                           - The examination was otherwise normal on direct                            and retroflexion  views.                           - No specimens collected. Recommendation:           - Repeat colonoscopy in 2 years for                            surveillance(personal history).                           - Patient has a contact number available for                            emergencies. The signs and symptoms of potential                            delayed complications were discussed with the                            patient. Return to normal activities tomorrow.  Written discharge instructions were provided to the                            patient.                           - Resume previous diet.                           - Continue present medications. Docia Chuck. Henrene Pastor, MD 05/31/2017 2:28:57 PM This report has been signed electronically.

## 2017-05-31 NOTE — Progress Notes (Signed)
Pt's states no medical or surgical changes since previsit or office visit. 

## 2017-05-31 NOTE — Progress Notes (Signed)
  [facility] Post-op Note  Patient: Matthew [name]  Procedure(s) Performed: colonoscopy  Patient Location: [facility] - Recovery Area  Anesthesia Type: Deep Sedation/Propofol  Level of Consciousness: awake, oriented and patient cooperative  Airway and Oxygen Therapy: Patient Spontanous Breathing  Post-op Pain: none  Post-op Assessment:  Post-op Vital signs reviewed, Patient's Cardiovascular Status Stable, Respiratory Function Stable, Patent Airway, No signs of Nausea or vomiting and Pain level controlled  Post-op Vital Signs: Reviewed and stable  Complications: No apparent anesthesia complications  Arias Weinert E Bhavika Schnider 2:30 PM

## 2017-06-01 ENCOUNTER — Telehealth: Payer: Self-pay

## 2017-06-01 NOTE — Telephone Encounter (Signed)
  Follow up Call-  Call back number 05/31/2017 05/25/2015  Post procedure Call Back phone  # [phone]  Permission to leave phone message Yes Yes  Some recent data might be hidden     Patient questions:  Do you have a fever, pain , or abdominal swelling? No. Pain Score  0 *  Have you tolerated food without any problems? Yes.    Have you been able to return to your normal activities? Yes.    Do you have any questions about your discharge instructions: Diet   No. Medications  No. Follow up visit  No.  Do you have questions or concerns about your Care? No.  Actions: * If pain score is 4 or above: No action needed, pain <4.

## 2017-07-06 DIAGNOSIS — M7062 Trochanteric bursitis, left hip: Secondary | ICD-10-CM | POA: Diagnosis not present

## 2017-07-12 ENCOUNTER — Other Ambulatory Visit: Payer: Self-pay | Admitting: Family Medicine

## 2017-07-24 DIAGNOSIS — H2513 Age-related nuclear cataract, bilateral: Secondary | ICD-10-CM | POA: Diagnosis not present

## 2017-08-02 DIAGNOSIS — L570 Actinic keratosis: Secondary | ICD-10-CM | POA: Diagnosis not present

## 2017-08-02 DIAGNOSIS — D229 Melanocytic nevi, unspecified: Secondary | ICD-10-CM | POA: Diagnosis not present

## 2017-08-02 DIAGNOSIS — L578 Other skin changes due to chronic exposure to nonionizing radiation: Secondary | ICD-10-CM | POA: Diagnosis not present

## 2017-08-02 DIAGNOSIS — D1800 Hemangioma unspecified site: Secondary | ICD-10-CM | POA: Diagnosis not present

## 2017-08-02 DIAGNOSIS — L820 Inflamed seborrheic keratosis: Secondary | ICD-10-CM | POA: Diagnosis not present

## 2017-08-02 DIAGNOSIS — L821 Other seborrheic keratosis: Secondary | ICD-10-CM | POA: Diagnosis not present

## 2017-08-16 ENCOUNTER — Other Ambulatory Visit: Payer: Self-pay | Admitting: Family Medicine

## 2017-09-26 ENCOUNTER — Ambulatory Visit: Payer: Medicare Other | Admitting: Physician Assistant

## 2017-09-26 ENCOUNTER — Ambulatory Visit (INDEPENDENT_AMBULATORY_CARE_PROVIDER_SITE_OTHER): Payer: Medicare Other | Admitting: Family Medicine

## 2017-09-26 DIAGNOSIS — Z23 Encounter for immunization: Secondary | ICD-10-CM | POA: Diagnosis not present

## 2017-09-30 ENCOUNTER — Ambulatory Visit: Payer: Medicare Other | Admitting: Family Medicine

## 2017-10-18 ENCOUNTER — Ambulatory Visit: Payer: Medicare Other | Admitting: Urology

## 2017-10-26 ENCOUNTER — Ambulatory Visit
Admission: RE | Admit: 2017-10-26 | Discharge: 2017-10-26 | Disposition: A | Discharge status: Home / Self Care | Payer: Medicare Other | Source: Ambulatory Visit | Attending: Urology | Admitting: Urology

## 2017-10-26 ENCOUNTER — Other Ambulatory Visit: Payer: Self-pay

## 2017-10-26 ENCOUNTER — Other Ambulatory Visit: Payer: Medicare Other

## 2017-10-26 DIAGNOSIS — C6202 Malignant neoplasm of undescended left testis: Secondary | ICD-10-CM | POA: Diagnosis not present

## 2017-10-26 DIAGNOSIS — Z8547 Personal history of malignant neoplasm of testis: Secondary | ICD-10-CM

## 2017-10-26 DIAGNOSIS — I700 Atherosclerosis of aorta: Secondary | ICD-10-CM | POA: Insufficient documentation

## 2017-10-26 DIAGNOSIS — N200 Calculus of kidney: Secondary | ICD-10-CM | POA: Diagnosis not present

## 2017-10-26 DIAGNOSIS — Z08 Encounter for follow-up examination after completed treatment for malignant neoplasm: Secondary | ICD-10-CM | POA: Diagnosis not present

## 2017-10-26 LAB — POCT I-STAT CREATININE: Creatinine, Ser: 1.1 mg/dL (ref 0.61–1.24)

## 2017-10-26 MED ORDER — IOPAMIDOL (ISOVUE-370) INJECTION 76%
75.0000 mL | Freq: Once | INTRAVENOUS | Status: AC | PRN
  Administered 2017-10-26: 75 mL via INTRAVENOUS

## 2017-10-27 ENCOUNTER — Ambulatory Visit: Payer: Medicare Other | Admitting: Family Medicine

## 2017-10-27 ENCOUNTER — Encounter: Payer: Self-pay | Admitting: Family Medicine

## 2017-10-27 VITALS — BP 140/70 | HR 76 | Temp 98.1°F | Resp 18 | Wt 259.0 lb

## 2017-10-27 DIAGNOSIS — R05 Cough: Secondary | ICD-10-CM | POA: Diagnosis not present

## 2017-10-27 DIAGNOSIS — J40 Bronchitis, not specified as acute or chronic: Secondary | ICD-10-CM | POA: Diagnosis not present

## 2017-10-27 DIAGNOSIS — J0100 Acute maxillary sinusitis, unspecified: Secondary | ICD-10-CM

## 2017-10-27 DIAGNOSIS — R059 Cough, unspecified: Secondary | ICD-10-CM

## 2017-10-27 LAB — LACTATE DEHYDROGENASE: LDH: 153 IU/L (ref 121–224)

## 2017-10-27 LAB — BETA HCG QUANT (REF LAB): hCG Quant: <1 m[IU]/mL (ref 0–3)

## 2017-10-27 LAB — AFP TUMOR MARKER: AFP, Serum, Tumor Marker: 4.7 ng/mL (ref 0.0–8.3)

## 2017-10-27 MED ORDER — AMOXICILLIN 500 MG PO CAPS: 1000.0000 mg | ORAL_CAPSULE | Freq: Two times a day (BID) | ORAL | 0 refills | Status: AC

## 2017-10-27 MED ORDER — BENZONATATE 100 MG PO CAPS: 100.0000 mg | ORAL_CAPSULE | Freq: Two times a day (BID) | ORAL | 0 refills | Status: DC | PRN

## 2017-10-27 NOTE — Progress Notes (Signed)
Patient: Matthew [name] Male    DOB: 1946/04/17   71 y.o.   MRN: [id number] Visit Date: 10/27/2017  Today's Provider: Lelon Huh, MD   Chief Complaint  Patient presents with  . Cough    x3-4 days   Subjective:    Cough  This is a new problem. Episode onset: 3-4 days. The problem has been gradually worsening. The cough is productive of sputum (yellow colored sputum). Associated symptoms include ear pain (left ear), headaches, nasal congestion, postnasal drip, rhinorrhea, a sore throat and wheezing. Pertinent negatives include no chest pain, chills, ear congestion, fever, hemoptysis, myalgias or shortness of breath. Associated symptoms comments: Labored breathing. Treatments tried: Mucinex. The treatment provided mild relief.  Patient sttaes it feels like something is in his throat.     No Known Allergies   Current Outpatient Medications:  .  aspirin 81 MG tablet, Take 81 mg by mouth daily., Disp: , Rfl:  .  atorvastatin (LIPITOR) 20 MG tablet, TAKE 1 TABLET (20 MG TOTAL) BY MOUTH DAILY., Disp: 90 tablet, Rfl: 3 .  folic acid (FOLVITE) 500 MCG tablet, Take 400 mcg by mouth daily., Disp: , Rfl:  .  ibuprofen (ADVIL,MOTRIN) 200 MG tablet, Take 200 mg by mouth every 4 (four) hours as needed. , Disp: , Rfl:  .  levalbuterol (XOPENEX HFA) 45 MCG/ACT inhaler, Inhale 1-2 puffs into the lungs every 4 (four) hours as needed., Disp: , Rfl:  .  levothyroxine (SYNTHROID, LEVOTHROID) 75 MCG tablet, TAKE 1 TABLET (75 MCG TOTAL) BY MOUTH DAILY., Disp: 90 tablet, Rfl: 3 .  meloxicam (MOBIC) 15 MG tablet, Take 15 mg by mouth daily., Disp: , Rfl:  .  Multiple Vitamins-Minerals (CENTRUM SILVER ADULT 50+ PO), Take 1 tablet by mouth daily., Disp: , Rfl:  .  Naproxen Sodium (ALEVE) 220 MG CAPS, as needed., Disp: , Rfl:  .  Oxymetazoline HCl (AFRIN NASAL SPRAY NA), Place into the nose as needed.  , Disp: , Rfl:  .  Phenylephrine-APAP-Guaifenesin (MUCINEX SINUS-MAX CONGESTION) 5-325-200 MG TABS,  Per bottle directions as needed, Disp: , Rfl:  .  Potassium 75 MG TABS, Take by mouth daily., Disp: , Rfl:  .  sildenafil (REVATIO) 20 MG tablet, Take 2 to 4 tablets daily as needed, Disp: 50 tablet, Rfl: 12 .  SYMBICORT 160-4.5 MCG/ACT inhaler, INHALE 1-2 PUFFS TWICE DAILY, Disp: 10.2 Inhaler, Rfl: 12 .  fluticasone (FLONASE) 50 MCG/ACT nasal spray, Place 2 sprays into both nostrils daily. (Patient not taking: Reported on 10/27/2017), Disp: 16 g, Rfl: 6  Current Facility-Administered Medications:  .  0.9 %  sodium chloride infusion, 500 mL, Intravenous, Continuous, Irene Shipper, MD  Review of Systems  Constitutional: Negative for appetite change, chills and fever.  HENT: Positive for ear pain (left ear), postnasal drip, rhinorrhea, sore throat and voice change.   Respiratory: Positive for cough and wheezing. Negative for hemoptysis, chest tightness and shortness of breath.   Cardiovascular: Negative for chest pain and palpitations.  Gastrointestinal: Negative for abdominal pain, nausea and vomiting.  Musculoskeletal: Negative for myalgias.  Neurological: Positive for headaches.    Social History   Tobacco Use  . Smoking status: Never Smoker  . Smokeless tobacco: Never Used  Substance Use Topics  . Alcohol use: Yes    Alcohol/week: 10.2 oz    Types: 10 Shots of liquor, 7 Standard drinks or equivalent per week    Comment: or less   Objective:   BP 140/70 (BP  Location: Left Arm, Patient Position: Sitting, Cuff Size: Large)   Pulse 76   Temp 98.1 F (36.7 C) (Oral)   Resp 18   Wt 259 lb (117.5 kg)   SpO2 97% Comment: room air  BMI 34.17 kg/m  There were no vitals filed for this visit.   Physical Exam   General Appearance:    Alert, cooperative, no distress  HENT:   bilateral TM normal without fluid or infection, neck without nodes, throat normal without erythema or exudate, frontal sinus tender and nasal mucosa congested  Eyes:    PERRL, conjunctiva/corneas clear, EOM's  intact       Lungs:     Occasional expiratory wheezes, no rales, respirations unlabored  Heart:    Regular rate and rhythm  Neurologic:   Awake, alert, oriented x 3. No apparent focal neurological           defect.           Assessment & Plan:     1. Cough - benzonatate (TESSALON) 100 MG capsule; Take 1 capsule (100 mg total) by mouth 2 (two) times daily as needed for cough.  Dispense: 20 capsule; Refill: 0  2. Acute maxillary sinusitis, recurrence not specified  - amoxicillin (AMOXIL) 500 MG capsule; Take 2 capsules (1,000 mg total) by mouth 2 (two) times daily for 10 days.  Dispense: 40 capsule; Refill: 0  3. Bronchitis  - amoxicillin (AMOXIL) 500 MG capsule; Take 2 capsules (1,000 mg total) by mouth 2 (two) times daily for 10 days.  Dispense: 40 capsule; Refill: 0       Lelon Huh, MD  [facility]

## 2017-11-09 ENCOUNTER — Ambulatory Visit: Payer: Medicare Other | Admitting: Urology

## 2017-11-13 ENCOUNTER — Telehealth: Payer: Self-pay | Admitting: Family Medicine

## 2017-11-13 NOTE — Telephone Encounter (Signed)
-----   Message from Hollice Espy, MD sent at 11/13/2017 12:37 PM EST ----- Tumor makers are normal.   Hollice Espy, MD

## 2017-11-13 NOTE — Telephone Encounter (Signed)
Patient notified

## 2017-11-17 ENCOUNTER — Ambulatory Visit: Payer: Medicare Other | Admitting: Urology

## 2017-11-17 ENCOUNTER — Encounter: Payer: Self-pay | Admitting: Urology

## 2017-11-17 VITALS — BP 135/78 | HR 83 | Ht 73.0 in | Wt 256.4 lb

## 2017-11-17 DIAGNOSIS — Z8547 Personal history of malignant neoplasm of testis: Secondary | ICD-10-CM

## 2017-11-17 NOTE — Progress Notes (Signed)
10:59 AM  11/17/17  Wenda Low [birth year] [id number]  Referring provider: Jerrol Banana., MD [address] [place], [place] [address]  Chief Complaint  Patient presents with  . 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 He returns today to review his most recent imaging and lab data.  Pathology for left inguinal radical orchiectomy from 07/02/14 shows mixed germ cell tumor with 40% embryonal, 40% teratoma, 5% yolk sac, and 5% seminoma. Tumor confined to testicle without LVI or local invasion of tunica vaginalis. Tumor markers negative.  Most recent imaging performed on 10/25/2017 was stable without any evidence of metastatic disease.   Tumor markers within normal limits.  At one point, he did have an elevated LDH for unclear reasons which returned to baseline.  Regular self testicular exams benign.  PMH: Past Medical History:  Diagnosis Date  . Allergic rhinitis, cause unspecified   . Benign neoplasm of colon   . Collapsed lung   . COPD (chronic obstructive pulmonary disease) ([place])   . ED (erectile dysfunction)   . GERD (gastroesophageal reflux disease)   . Hyperlipidemia   . Internal hemorrhoids without mention of complication   . OSA (obstructive sleep apnea)    wears CPAP  . Sleep apnea    no CPAP  . Testicular cancer ([place]) 07/2014  . Thyroid disease   . Unspecified asthma(493.90)   . Unspecified sinusitis (chronic)     Surgical History: Past Surgical History:  Procedure Laterality Date  . COLONOSCOPY    . DENTAL SURGERY  12/2014  . INGUINAL HERNIA REPAIR    . NOSE SURGERY     submucous surgery  . POLYPECTOMY    . SINUS SURGERY WITH INSTATRAK    . SUBCLAVIAN BYPASS GRAFT  2009  . TESTICLE REMOVAL      Home Medications:  Allergies as of 11/17/2017   No Known Allergies     Medication List        Accurate as of 11/17/17 10:59 AM.  Always use your most recent med list.          [place] NA Place into the nose as needed.   ALEVE 220 MG Caps Generic drug:  Naproxen Sodium as needed.   aspirin 81 MG tablet Take 81 mg by mouth daily.   atorvastatin 20 MG tablet Commonly known as:  LIPITOR TAKE 1 TABLET (20 MG TOTAL) BY MOUTH DAILY.   benzonatate 100 MG capsule Commonly known as:  TESSALON Take 1 capsule (100 mg total) by mouth 2 (two) times daily as needed for cough.   CENTRUM SILVER ADULT 50+ PO Take 1 tablet by mouth daily.   fluticasone 50 MCG/ACT nasal spray Commonly known as:  FLONASE Place 2 sprays into both nostrils daily.   folic acid 937 MCG tablet Commonly known as:  FOLVITE Take 400 mcg by mouth daily.   ibuprofen 200 MG tablet Commonly known as:  ADVIL,MOTRIN Take 200 mg by mouth every 4 (four) hours as needed.   levalbuterol 45 MCG/ACT inhaler Commonly known as:  XOPENEX HFA Inhale 1-2 puffs into the lungs every 4 (four) hours as needed.   levothyroxine 75 MCG tablet Commonly known as:  SYNTHROID, LEVOTHROID TAKE 1 TABLET (75 MCG TOTAL) BY MOUTH DAILY.   meloxicam 15 MG tablet Commonly known as:  MOBIC Take 15 mg by mouth daily.  MUCINEX SINUS-MAX CONGESTION 5-325-200 MG Tabs Generic drug:  Phenylephrine-APAP-Guaifenesin Per bottle directions as needed   Potassium 75 MG Tabs Take by mouth daily.   sildenafil 20 MG tablet Commonly known as:  REVATIO Take 2 to 4 tablets daily as needed   SYMBICORT 160-4.5 MCG/ACT inhaler Generic drug:  budesonide-formoterol INHALE 1-2 PUFFS TWICE DAILY       Allergies: No Known Allergies  Family History: Family History  Problem Relation Age of Onset  . Colon cancer Sister 10  . Breast cancer Sister   . Heart attack Sister   . Heart attack Sister        deceased from MI  . Allergies Father   . Heart attack Father   . CAD Father        deceased 75 from fall and possible subdural hematoma  . Congestive Heart Failure  Mother   . Emphysema Mother        deceased 85 with emphysema, tobacco abuse  . Asthma Sister   . CAD Brother   . Stroke Brother   . Lung cancer Brother   . Arthritis Sister   . Esophageal cancer Neg Hx   . Stomach cancer Neg Hx   . Rectal cancer Neg Hx     Social History:  reports that  has never smoked. he has never used smokeless tobacco. He reports that he drinks about 10.2 oz of alcohol per week. He reports that he does not use drugs.  ROS: UROLOGY Frequent Urination?: No Hard to postpone urination?: No Burning/pain with urination?: No Get up at night to urinate?: Yes Leakage of urine?: No Urine stream starts and stops?: No Trouble starting stream?: No Do you have to strain to urinate?: No Blood in urine?: No Urinary tract infection?: No Sexually transmitted disease?: No Injury to kidneys or bladder?: No Painful intercourse?: No Weak stream?: No Erection problems?: No Penile pain?: No  Gastrointestinal Nausea?: No Vomiting?: No Indigestion/heartburn?: No Diarrhea?: No Constipation?: No  Constitutional Fever: No Night sweats?: No Weight loss?: No Fatigue?: No  Skin Skin rash/lesions?: No Itching?: No  Eyes Blurred vision?: Yes Double vision?: No  Ears/Nose/Throat Sore throat?: No Sinus problems?: Yes  Hematologic/Lymphatic Swollen glands?: No Easy bruising?: No  Cardiovascular Leg swelling?: No Chest pain?: No  Respiratory Cough?: Yes Shortness of breath?: Yes  Endocrine Excessive thirst?: No  Musculoskeletal Back pain?: No Joint pain?: No  Neurological Headaches?: No Dizziness?: No  Psychologic Depression?: No Anxiety?: No  Physical Exam: BP 135/78 (BP Location: Left Arm, Patient Position: Sitting, Cuff Size: Normal)   Pulse 83   Ht 6\' 1"  (1.854 m)   Wt 256 lb 6.4 oz (116.3 kg)   BMI 33.83 kg/m   Constitutional:  Alert and oriented, No acute distress.  Accompanied by wife today. HEENT: [place] AT, moist mucus membranes.   Trachea midline, no masses. Cardiovascular: No clubbing, cyanosis, or edema. Respiratory: Normal respiratory effort, no increased work of breathing. GI: Abdomen is soft, nontender, nondistended, no abdominal masses.   GU: Left testicle surgically absent.  Right testicle normal without masses or lesions.  Bilateral inguinal areas unremarkable.  Surgical scar not readily visible.  No appreciable adenopathy. Skin: No rashes, bruises or suspicious lesions. Neurologic: Grossly intact, no focal deficits, moving all 4 extremities. Psychiatric: Normal mood and affect.  Laboratory Data: Lab Results  Component Value Date   WBC 5.9 05/17/2017   HGB 14.5 05/17/2017   HCT 43.6 05/17/2017   MCV 95 05/17/2017   PLT 210 05/17/2017  Lab Results  Component Value Date   CREATININE 1.10 10/26/2017    Pertinent Imaging: CLINICAL DATA:  Is tear testicular malignancy. History of asthma. Nonsmoker.  EXAM: CHEST 1 VIEW  COMPARISON:  Chest x-ray of October 25, 2016  FINDINGS: The right hemidiaphragm remains elevated. The aerated portion of the right lung is clear. The left lung is well-expanded and clear. No pulmonary parenchymal nodules or masses are observed. The heart and pulmonary vascularity are normal. There is calcification in the wall of the aortic arch. The trachea is midline. The bony thorax exhibits no acute abnormality.  IMPRESSION: There is no evidence of metastatic disease nor other acute cardiopulmonary abnormality.  Thoracic aortic atherosclerosis.   Electronically Signed   By: David  Martinique M.D.   On: 10/26/2017 08:56  CLINICAL DATA:  Followup left testicular carcinoma. Previous left orchiectomy.  EXAM: CT ABDOMEN AND PELVIS WITH CONTRAST  TECHNIQUE: Multidetector CT imaging of the abdomen and pelvis was performed using the standard protocol following bolus administration of intravenous contrast.  CONTRAST:  50mL ISOVUE-370 IOPAMIDOL (ISOVUE-370)  INJECTION 76%  COMPARISON:  10/25/2016  FINDINGS: Lower Chest: No acute findings. Aortic and coronary artery atherosclerosis noted.  Hepatobiliary: No hepatic masses identified. Gallbladder is unremarkable.  Pancreas:  No mass or inflammatory changes.  Spleen: Within normal limits in size and appearance.  Adrenals/Urinary Tract: No masses identified. No evidence of hydronephrosis. Stable 5 mm nonobstructive calculus in lower pole of right kidney. No evidence ureteral calculi or hydronephrosis.  Stomach/Bowel: No evidence of obstruction, inflammatory process or abnormal fluid collections.  Vascular/Lymphatic: No pathologically enlarged lymph nodes. No abdominal aortic aneurysm. Aortic atherosclerosis.  Reproductive: Stable postop changes from previous left orchiectomy. No mass or other significant abnormality identified.  Other:  None.  Musculoskeletal:  No suspicious bone lesions identified.  IMPRESSION: No evidence of metastatic disease or other acute findings.  Stable nonobstructive right renal calculus.  Aortic atherosclerosis.   Electronically Signed   By: Earle Gell M.D.   On: 10/26/2017 10:20   Chest x-ray and CT scan images personally reviewed today.  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  Per NCCN and guidelines,  imaging may be spread out to CT scan every 1 year with chest x-ray annually.    1. Non-seminomatous testicular cancer, left  NED Imaging and labs reviewed with the patient Continue annual surveillance until he is 5 years out from diagnosis per [place] and guidelines - CT Abdomen Pelvis W Wo Contrast; Future - Chest 1 View; Future - Lactate dehydrogenase - AFP tumor marker - Beta HCG, Quant (tumor marker)  Return in about 1 year (around 11/17/2018) for CT / tumor markers/ CXR.  Hollice Espy, MD  [facility] [address], [place]  [place], [place] [address] [phone]  I spent 15 min with this patient of which greater than 50% was spent in counseling and coordination of care with the patient.

## 2017-11-27 ENCOUNTER — Ambulatory Visit: Payer: Medicare Other | Admitting: Family Medicine

## 2017-12-08 DIAGNOSIS — M79672 Pain in left foot: Secondary | ICD-10-CM | POA: Diagnosis not present

## 2017-12-08 DIAGNOSIS — M79671 Pain in right foot: Secondary | ICD-10-CM | POA: Diagnosis not present

## 2017-12-08 DIAGNOSIS — M722 Plantar fascial fibromatosis: Secondary | ICD-10-CM | POA: Diagnosis not present

## 2017-12-22 DIAGNOSIS — M722 Plantar fascial fibromatosis: Secondary | ICD-10-CM | POA: Diagnosis not present

## 2017-12-22 DIAGNOSIS — M79672 Pain in left foot: Secondary | ICD-10-CM | POA: Diagnosis not present

## 2018-01-19 DIAGNOSIS — M79672 Pain in left foot: Secondary | ICD-10-CM | POA: Diagnosis not present

## 2018-01-19 DIAGNOSIS — M7742 Metatarsalgia, left foot: Secondary | ICD-10-CM | POA: Diagnosis not present

## 2018-02-08 DIAGNOSIS — M79672 Pain in left foot: Secondary | ICD-10-CM | POA: Diagnosis not present

## 2018-02-08 DIAGNOSIS — M722 Plantar fascial fibromatosis: Secondary | ICD-10-CM | POA: Diagnosis not present

## 2018-02-08 DIAGNOSIS — M79671 Pain in right foot: Secondary | ICD-10-CM | POA: Diagnosis not present

## 2018-03-06 ENCOUNTER — Encounter: Payer: Medicare Other | Admitting: Family Medicine

## 2018-03-15 DIAGNOSIS — M722 Plantar fascial fibromatosis: Secondary | ICD-10-CM | POA: Diagnosis not present

## 2018-03-16 DIAGNOSIS — H2513 Age-related nuclear cataract, bilateral: Secondary | ICD-10-CM | POA: Diagnosis not present

## 2018-05-17 ENCOUNTER — Encounter: Payer: Self-pay | Admitting: Family Medicine

## 2018-05-17 ENCOUNTER — Ambulatory Visit: Payer: Medicare Other | Admitting: Family Medicine

## 2018-05-17 VITALS — BP 132/70 | HR 70 | Temp 98.4°F | Resp 14 | Ht 73.0 in | Wt 257.0 lb

## 2018-05-17 DIAGNOSIS — I10 Essential (primary) hypertension: Secondary | ICD-10-CM | POA: Diagnosis not present

## 2018-05-17 DIAGNOSIS — E785 Hyperlipidemia, unspecified: Secondary | ICD-10-CM

## 2018-05-17 DIAGNOSIS — K649 Unspecified hemorrhoids: Secondary | ICD-10-CM | POA: Diagnosis not present

## 2018-05-17 DIAGNOSIS — E039 Hypothyroidism, unspecified: Secondary | ICD-10-CM

## 2018-05-17 DIAGNOSIS — K625 Hemorrhage of anus and rectum: Secondary | ICD-10-CM | POA: Diagnosis not present

## 2018-05-17 MED ORDER — HYDROCORTISONE ACETATE 25 MG RE SUPP: 25.0000 mg | Freq: Two times a day (BID) | RECTAL | 0 refills | Status: DC

## 2018-05-17 NOTE — Progress Notes (Signed)
Patient: Matthew [name] Male    DOB: 30-Sep-1946   72 y.o.   MRN: [id number] Visit Date: 05/17/2018  Today's Provider: Wilhemena Durie, MD   Chief Complaint  Patient presents with  . Rectal Bleeding   Subjective:    HPI Patient comes in today c/o bloody stools for 3 weeks. He reports that this comes and goes and does not see it after every BM. He had a colonoscopy in 05/2017 and it showed internal hemorrhoids. Pink blood on toilet paper.      No Known Allergies   Current Outpatient Medications:  .  aspirin 81 MG tablet, Take 81 mg by mouth daily., Disp: , Rfl:  .  atorvastatin (LIPITOR) 20 MG tablet, TAKE 1 TABLET (20 MG TOTAL) BY MOUTH DAILY., Disp: 90 tablet, Rfl: 3 .  folic acid (FOLVITE) 573 MCG tablet, Take 400 mcg by mouth daily., Disp: , Rfl:  .  ibuprofen (ADVIL,MOTRIN) 200 MG tablet, Take 200 mg by mouth every 4 (four) hours as needed. , Disp: , Rfl:  .  levalbuterol (XOPENEX HFA) 45 MCG/ACT inhaler, Inhale 1-2 puffs into the lungs every 4 (four) hours as needed., Disp: , Rfl:  .  levothyroxine (SYNTHROID, LEVOTHROID) 75 MCG tablet, TAKE 1 TABLET (75 MCG TOTAL) BY MOUTH DAILY., Disp: 90 tablet, Rfl: 3 .  Multiple Vitamins-Minerals (CENTRUM SILVER ADULT 50+ PO), Take 1 tablet by mouth daily., Disp: , Rfl:  .  Naproxen Sodium (ALEVE) 220 MG CAPS, as needed., Disp: , Rfl:  .  Oxymetazoline HCl (AFRIN NASAL SPRAY NA), Place into the nose as needed.  , Disp: , Rfl:  .  Phenylephrine-APAP-Guaifenesin (MUCINEX SINUS-MAX CONGESTION) 5-325-200 MG TABS, Per bottle directions as needed, Disp: , Rfl:  .  Potassium 75 MG TABS, Take by mouth daily., Disp: , Rfl:  .  SYMBICORT 160-4.5 MCG/ACT inhaler, INHALE 1-2 PUFFS TWICE DAILY, Disp: 10.2 Inhaler, Rfl: 12 .  benzonatate (TESSALON) 100 MG capsule, Take 1 capsule (100 mg total) by mouth 2 (two) times daily as needed for cough. (Patient not taking: Reported on 11/17/2017), Disp: 20 capsule, Rfl: 0 .  fluticasone (FLONASE) 50  MCG/ACT nasal spray, Place 2 sprays into both nostrils daily. (Patient not taking: Reported on 10/27/2017), Disp: 16 g, Rfl: 6 .  meloxicam (MOBIC) 15 MG tablet, Take 15 mg by mouth daily., Disp: , Rfl:  .  sildenafil (REVATIO) 20 MG tablet, Take 2 to 4 tablets daily as needed (Patient not taking: Reported on 11/17/2017), Disp: 50 tablet, Rfl: 12  Current Facility-Administered Medications:  .  0.9 %  sodium chloride infusion, 500 mL, Intravenous, Continuous, Irene Shipper, MD  Review of Systems  Constitutional: Negative for activity change, appetite change, chills, diaphoresis, fatigue, fever and unexpected weight change.  Eyes: Negative.   Respiratory: Negative.   Cardiovascular: Negative.   Gastrointestinal: Positive for abdominal distention, anal bleeding and blood in stool. Negative for abdominal pain, constipation, diarrhea, nausea, rectal pain and vomiting.  Endocrine: Negative.   Genitourinary: Negative.   Skin: Negative.   Allergic/Immunologic: Negative.   Neurological: Negative.   Hematological: Negative.   Psychiatric/Behavioral: Negative.     Social History   Tobacco Use  . Smoking status: Never Smoker  . Smokeless tobacco: Never Used  Substance Use Topics  . Alcohol use: Yes    Alcohol/week: 10.2 oz    Types: 10 Shots of liquor, 7 Standard drinks or equivalent per week    Comment: or less   Objective:  BP 132/70 (BP Location: Right Arm, Patient Position: Sitting, Cuff Size: Normal)   Pulse 70   Temp 98.4 F (36.9 C)   Resp 14   Ht 6\' 1"  (1.854 m)   Wt 257 lb (116.6 kg)   BMI 33.91 kg/m  Vitals:   05/17/18 0935  BP: 132/70  Pulse: 70  Resp: 14  Temp: 98.4 F (36.9 C)  Weight: 257 lb (116.6 kg)  Height: 6\' 1"  (1.854 m)      Physical Exam  Constitutional: He is oriented to person, place, and time. He appears well-developed and well-nourished.  HENT:  Head: Normocephalic and atraumatic.  Right Ear: External ear normal.  Left Ear: External ear  normal.  Nose: Nose normal.  Eyes: Conjunctivae are normal. No scleral icterus.  Neck: No thyromegaly present.  Cardiovascular: Normal rate, regular rhythm and normal heart sounds.  Pulmonary/Chest: Effort normal.  Abdominal: Soft.  Genitourinary:  Genitourinary Comments: Perianal exam normal.  Neurological: He is alert and oriented to person, place, and time.  Skin: Skin is warm and dry.  Psychiatric: He has a normal mood and affect. His behavior is normal. Judgment and thought content normal.        Assessment & Plan:     1. Hemorrhoids, unspecified hemorrhoid type/internal  - hydrocortisone (ANUSOL-HC) 25 MG suppository; Place 1 suppository (25 mg total) rectally 2 (two) times daily.  Dispense: 12 suppository; Refill: 0  2. Rectal bleeding Normal colonoascopy last year. - CBC with Differential/Platelet  3. Essential (primary) hypertension  - Comprehensive metabolic panel  4. Hyperlipidemia, unspecified hyperlipidemia type  - Lipid panel  5. Adult hypothyroidism  - TSH  I have done the exam and reviewed the chart and it is accurate to the best of my knowledge. Development worker, community has been used and  any errors in dictation or transcription are unintentional. Miguel Aschoff M.D. [place], MD  [facility]

## 2018-05-18 ENCOUNTER — Telehealth: Payer: Self-pay

## 2018-05-18 LAB — COMPREHENSIVE METABOLIC PANEL
ALT: 27 IU/L (ref 0–44)
AST: 17 IU/L (ref 0–40)
Albumin/Globulin Ratio: 1.9 (ref 1.2–2.2)
Albumin: 4.1 g/dL (ref 3.5–4.8)
Alkaline Phosphatase: 54 IU/L (ref 39–117)
BUN/Creatinine Ratio: 22 (ref 10–24)
BUN: 20 mg/dL (ref 8–27)
Bilirubin Total: 0.5 mg/dL (ref 0.0–1.2)
CO2: 23 mmol/L (ref 20–29)
Calcium: 9 mg/dL (ref 8.6–10.2)
Chloride: 103 mmol/L (ref 96–106)
Creatinine, Ser: 0.9 mg/dL (ref 0.76–1.27)
GFR calc Af Amer: 98 mL/min/{1.73_m2} (ref >59)
GFR calc non Af Amer: 85 mL/min/{1.73_m2} (ref >59)
Globulin, Total: 2.2 g/dL (ref 1.5–4.5)
Glucose: 94 mg/dL (ref 65–99)
Potassium: 4.2 mmol/L (ref 3.5–5.2)
Sodium: 141 mmol/L (ref 134–144)
Total Protein: 6.3 g/dL (ref 6.0–8.5)

## 2018-05-18 LAB — LIPID PANEL
Chol/HDL Ratio: 2.7 ratio (ref 0.0–5.0)
Cholesterol, Total: 121 mg/dL (ref 100–199)
HDL: 45 mg/dL (ref >39)
LDL Calculated: 53 mg/dL (ref 0–99)
Triglycerides: 113 mg/dL (ref 0–149)
VLDL Cholesterol Cal: 23 mg/dL (ref 5–40)

## 2018-05-18 LAB — CBC WITH DIFFERENTIAL/PLATELET
Basophils Absolute: 0 10*3/uL (ref 0.0–0.2)
Basos: 0 %
EOS (ABSOLUTE): 0.3 10*3/uL (ref 0.0–0.4)
Eos: 5 %
Hematocrit: 41.1 % (ref 37.5–51.0)
Hemoglobin: 14.6 g/dL (ref 13.0–17.7)
Immature Grans (Abs): 0 10*3/uL (ref 0.0–0.1)
Immature Granulocytes: 0 %
Lymphocytes Absolute: 1.2 10*3/uL (ref 0.7–3.1)
Lymphs: 20 %
MCH: 34.3 pg — ABNORMAL HIGH (ref 26.6–33.0)
MCHC: 35.5 g/dL (ref 31.5–35.7)
MCV: 97 fL (ref 79–97)
Monocytes Absolute: 0.7 10*3/uL (ref 0.1–0.9)
Monocytes: 11 %
Neutrophils Absolute: 3.8 10*3/uL (ref 1.4–7.0)
Neutrophils: 64 %
Platelets: 211 10*3/uL (ref 150–450)
RBC: 4.26 x10E6/uL (ref 4.14–5.80)
RDW: 13.1 % (ref 12.3–15.4)
WBC: 6 10*3/uL (ref 3.4–10.8)

## 2018-05-18 LAB — TSH: TSH: 0.382 u[IU]/mL — ABNORMAL LOW (ref 0.450–4.500)

## 2018-05-18 MED ORDER — LEVOTHYROXINE SODIUM 50 MCG PO TABS: 50.0000 ug | ORAL_TABLET | Freq: Every day | ORAL | 1 refills | Status: DC

## 2018-05-18 NOTE — Telephone Encounter (Signed)
-----   Message from Jerrol Banana., MD sent at 05/18/2018 11:09 AM EDT ----- Labs ok--decrease synthroid from 75 to 41mcg daily.

## 2018-05-18 NOTE — Telephone Encounter (Signed)
LMTCB 05/18/2018  Thanks,   -Mickel Baas

## 2018-05-18 NOTE — Telephone Encounter (Signed)
Pt advised RX sent to [facility] [address]

## 2018-06-04 DIAGNOSIS — L578 Other skin changes due to chronic exposure to nonionizing radiation: Secondary | ICD-10-CM | POA: Diagnosis not present

## 2018-06-04 DIAGNOSIS — L821 Other seborrheic keratosis: Secondary | ICD-10-CM | POA: Diagnosis not present

## 2018-06-04 DIAGNOSIS — L570 Actinic keratosis: Secondary | ICD-10-CM | POA: Diagnosis not present

## 2018-06-04 DIAGNOSIS — L820 Inflamed seborrheic keratosis: Secondary | ICD-10-CM | POA: Diagnosis not present

## 2018-06-06 ENCOUNTER — Ambulatory Visit (INDEPENDENT_AMBULATORY_CARE_PROVIDER_SITE_OTHER): Payer: Medicare Other

## 2018-06-06 VITALS — BP 120/68 | HR 73 | Temp 98.9°F | Ht 73.0 in | Wt 253.0 lb

## 2018-06-06 DIAGNOSIS — Z0000 Encounter for general adult medical examination without abnormal findings: Secondary | ICD-10-CM

## 2018-06-06 NOTE — Progress Notes (Signed)
Subjective:   Matthew [name] is a 72 y.o. male who presents for Medicare Annual/Subsequent preventive examination.  Review of Systems:  N/A  Cardiac Risk Factors include: advanced age (>81men, >92 women);hypertension;male gender;dyslipidemia;obesity (BMI >30kg/m2)     Objective:    Vitals: BP 120/68 (BP Location: Right Arm)   Pulse 73   Temp 98.9 F (37.2 C) (Oral)   Ht 6\' 1"  (1.854 m)   Wt 253 lb (114.8 kg)   BMI 33.38 kg/m   Body mass index is 33.38 kg/m.  Advanced Directives 06/06/2018 05/24/2017 05/17/2017 10/03/2016  Does Patient Have a Medical Advance Directive? Yes Yes Yes Yes  Type of Paramedic of [place];Living will [place];Living will [place];Living will Living will;Healthcare Power of [place] in Chart? No - copy requested No - copy requested - -    Tobacco Social History   Tobacco Use  Smoking Status Never Smoker  Smokeless Tobacco Never Used     Counseling given: Not Answered   Clinical Intake:  Pre-visit preparation completed: Yes  Pain : No/denies pain Pain Score: 0-No pain     Nutritional Status: BMI > 30  Obese Nutritional Risks: None Diabetes: No  How often do you need to have someone help you when you read instructions, pamphlets, or other written materials from your doctor or pharmacy?: 1 - Never  Interpreter Needed?: No  Information entered by :: [facility], LPN  Past Medical History:  Diagnosis Date  . Allergic rhinitis, cause unspecified   . Benign neoplasm of colon   . Collapsed lung   . COPD (chronic obstructive pulmonary disease) ([place])   . ED (erectile dysfunction)   . GERD (gastroesophageal reflux disease)   . Hyperlipidemia   . Hypertension   . Internal hemorrhoids without mention of complication   . OSA (obstructive sleep apnea)    wears CPAP  . Sleep apnea    no CPAP  . Testicular cancer ([place]) 07/2014  . Thyroid  disease   . Unspecified asthma(493.90)   . Unspecified sinusitis (chronic)    Past Surgical History:  Procedure Laterality Date  . COLONOSCOPY    . DENTAL SURGERY  12/2014  . INGUINAL HERNIA REPAIR    . NOSE SURGERY     submucous surgery  . POLYPECTOMY    . SINUS SURGERY WITH INSTATRAK    . SUBCLAVIAN BYPASS GRAFT  2009  . TESTICLE REMOVAL     Family History  Problem Relation Age of Onset  . Colon cancer Sister 28  . Breast cancer Sister   . Heart attack Sister   . Heart attack Sister        deceased from MI  . Allergies Father   . Heart attack Father   . CAD Father        deceased 77 from fall and possible subdural hematoma  . Congestive Heart Failure Mother   . Emphysema Mother        deceased 59 with emphysema, tobacco abuse  . Asthma Sister   . CAD Brother   . Stroke Brother   . Lung cancer Brother   . Arthritis Sister   . Esophageal cancer Neg Hx   . Stomach cancer Neg Hx   . Rectal cancer Neg Hx    Social History   Socioeconomic History  . Marital status: Married    Spouse name: Not on file  . Number of children: 2  .  Years of education: Not on file  . Highest education level: 12th grade  Occupational History  . Occupation: retired    Comment: Barista  . Financial resource strain: Not hard at all  . Food insecurity:    Worry: Never true    Inability: Never true  . Transportation needs:    Medical: No    Non-medical: No  Tobacco Use  . Smoking status: Never Smoker  . Smokeless tobacco: Never Used  Substance and Sexual Activity  . Alcohol use: Yes    Alcohol/week: 4.2 - 6.0 oz    Types: 7 - 10 Shots of liquor per week  . Drug use: No  . Sexual activity: Not on file  Lifestyle  . Physical activity:    Days per week: Not on file    Minutes per session: Not on file  . Stress: Not at all  Relationships  . Social connections:    Talks on phone: Not on file    Gets together: Not on file    Attends religious service: Not on  file    Active member of club or organization: Not on file    Attends meetings of clubs or organizations: Not on file    Relationship status: Not on file  Other Topics Concern  . Not on file  Social History Narrative  . Not on file    Outpatient Encounter Medications as of 06/06/2018  Medication Sig  . aspirin 81 MG tablet Take 81 mg by mouth daily.  Marland Kitchen atorvastatin (LIPITOR) 20 MG tablet TAKE 1 TABLET (20 MG TOTAL) BY MOUTH DAILY.  . folic acid (FOLVITE) 161 MCG tablet Take 400 mcg by mouth daily.  . hydrocortisone (ANUSOL-HC) 25 MG suppository Place 1 suppository (25 mg total) rectally 2 (two) times daily.  Marland Kitchen ibuprofen (ADVIL,MOTRIN) 200 MG tablet Take 200 mg by mouth every 4 (four) hours as needed.   . levalbuterol (XOPENEX HFA) 45 MCG/ACT inhaler Inhale 1-2 puffs into the lungs every 4 (four) hours as needed.  Marland Kitchen levothyroxine (SYNTHROID, LEVOTHROID) 50 MCG tablet Take 1 tablet (50 mcg total) by mouth daily.  . meloxicam (MOBIC) 15 MG tablet Take 15 mg by mouth daily.  . Multiple Vitamins-Minerals (CENTRUM SILVER ADULT 50+ PO) Take 1 tablet by mouth daily.  . Naproxen Sodium (ALEVE) 220 MG CAPS as needed.  . Oxymetazoline HCl (AFRIN NASAL SPRAY NA) Place into the nose as needed.    Marland Kitchen Phenylephrine-APAP-Guaifenesin (MUCINEX SINUS-MAX CONGESTION) 5-325-200 MG TABS Per bottle directions as needed  . Potassium 75 MG TABS Take by mouth daily.  . SYMBICORT 160-4.5 MCG/ACT inhaler INHALE 1-2 PUFFS TWICE DAILY  . benzonatate (TESSALON) 100 MG capsule Take 1 capsule (100 mg total) by mouth 2 (two) times daily as needed for cough. (Patient not taking: Reported on 11/17/2017)  . fluticasone (FLONASE) 50 MCG/ACT nasal spray Place 2 sprays into both nostrils daily. (Patient not taking: Reported on 10/27/2017)  . sildenafil (REVATIO) 20 MG tablet Take 2 to 4 tablets daily as needed (Patient not taking: Reported on 11/17/2017)   No facility-administered encounter medications on file as of 06/06/2018.      Activities of Daily Living In your present state of health, do you have any difficulty performing the following activities: 06/06/2018  Hearing? Y  Comment Does not wearing hearing aids or thinks he needs them.  Vision? N  Difficulty concentrating or making decisions? N  Walking or climbing stairs? Y  Comment Occasional hip pain.  Dressing or  bathing? N  Doing errands, shopping? N  Preparing Food and eating ? N  Using the Toilet? N  In the past six months, have you accidently leaked urine? N  Do you have problems with loss of bowel control? N  Managing your Medications? N  Managing your Finances? N  Housekeeping or managing your Housekeeping? N  Some recent data might be hidden    Patient Care Team: Jerrol Banana., MD as PCP - General (Unknown Physician Specialty) [place], [place] as Consulting Physician [facility])   Assessment:   This is a routine wellness examination for [place].  Exercise Activities and Dietary recommendations Current Exercise Habits: The patient does not participate in regular exercise at present, Exercise limited by: None identified  Goals    . DIET - INCREASE WATER INTAKE     Recommend increasing water intake to 4-6 glasses a day.        Fall Risk Fall Risk  06/06/2018 05/24/2017 02/17/2016  Falls in the past year? No No No   Is the patient's home free of loose throw rugs in walkways, pet beds, electrical cords, etc?   yes      Grab bars in the bathroom? yes      Handrails on the stairs?   no      Adequate lighting?   yes  Timed Get Up and Go Performed: N/A  Depression Screen PHQ 2/9 Scores 06/06/2018 06/06/2018 05/24/2017 05/24/2017  PHQ - 2 Score 1 1 0 0  PHQ- 9 Score 4 - 5 -    Cognitive Function: Pt declined screening today.      6CIT Screen 05/24/2017  What Year? 0 points  What month? 0 points  What time? 3 points  Count back from 20 0 points  Months in reverse 0 points  Repeat phrase 0 points  Total Score 3     Immunization History  Administered Date(s) Administered  . Influenza Whole 07/31/2012  . Influenza, High Dose Seasonal PF 10/10/2016, 09/26/2017  . Pneumococcal Conjugate-13 08/04/2014  . Pneumococcal Polysaccharide-23 05/24/2017  . Td 07/30/2004  . Tdap 08/04/2014, 11/24/2016    Qualifies for Shingles Vaccine? Due for Shingles vaccine. Declined my offer to administer today. Education has been provided regarding the importance of this vaccine. Pt has been advised to call her insurance company to determine her out of pocket expense. Advised she may also receive this vaccine at her local pharmacy or Health Dept. Verbalized acceptance and understanding.  Screening Tests Health Maintenance  Topic Date Due  . INFLUENZA VACCINE  05/31/2018  . COLONOSCOPY  06/01/2019  . TETANUS/TDAP  11/24/2026  . Hepatitis C Screening  Completed  . PNA vac Low Risk Adult  Completed   Cancer Screenings: Lung: Low Dose CT Chest recommended if Age 19-80 years, 30 pack-year currently smoking OR have quit w/in 15years. Patient does not qualify. Colorectal: Up to date  Additional Screenings:  Hepatitis C Screening: Up to date      Plan:  I have personally reviewed and addressed the Medicare Annual Wellness questionnaire and have noted the following in the patient's chart:  A. Medical and social history B. Use of alcohol, tobacco or illicit drugs  C. Current medications and supplements D. Functional ability and status E.  Nutritional status F.  Physical activity G. Advance directives H. List of other physicians I.  Hospitalizations, surgeries, and ER visits in previous 12 months J.  [place] such as hearing and vision if needed, cognitive and depression  L. Referrals and appointments - none  In addition, I have reviewed and discussed with patient certain preventive protocols, quality metrics, and best practice recommendations. A written personalized care plan for preventive services as  well as general preventive health recommendations were provided to patient.  See attached scanned questionnaire for additional information.   Signed,  Fabio Neighbors, LPN Nurse Health Advisor   Nurse Recommendations: None.

## 2018-06-06 NOTE — Patient Instructions (Signed)
Matthew [name] , Thank you for taking time to come for your Medicare Wellness Visit. I appreciate your ongoing commitment to your health goals. Please review the following plan we discussed and let me know if I can assist you in the future.   Screening recommendations/referrals: Colonoscopy: Up to date Recommended yearly ophthalmology/optometry visit for glaucoma screening and checkup Recommended yearly dental visit for hygiene and checkup  Vaccinations: Influenza vaccine: Up to date Pneumococcal vaccine: Up to date Tdap vaccine: Up to date Shingles vaccine: Pt declines today.     Advanced directives: Please bring a copy of your POA (Power of Attorney) and/or Living Will to your next appointment.   Conditions/risks identified: Obesity; Recommend increasing water intake to 4-6 glasses a day.   Next appointment: 06/13/19 @ 9 AM for AWV and 9:40 AM for CPE.  Preventive Care 72 Years and Older, Male Preventive care refers to lifestyle choices and visits with your health care provider that can promote health and wellness. What does preventive care include?  A yearly physical exam. This is also called an annual well check.  Dental exams once or twice a year.  Routine eye exams. Ask your health care provider how often you should have your eyes checked.  Personal lifestyle choices, including:  Daily care of your teeth and gums.  Regular physical activity.  Eating a healthy diet.  Avoiding tobacco and drug use.  Limiting alcohol use.  Practicing safe sex.  Taking low doses of aspirin every day.  Taking vitamin and mineral supplements as recommended by your health care provider. What happens during an annual well check? The services and screenings done by your health care provider during your annual well check will depend on your age, overall health, lifestyle risk factors, and family history of disease. Counseling  Your health care provider may ask you questions about  your:  Alcohol use.  Tobacco use.  Drug use.  Emotional well-being.  Home and relationship well-being.  Sexual activity.  Eating habits.  History of falls.  Memory and ability to understand (cognition).  Work and work Statistician. Screening  You may have the following tests or measurements:  Height, weight, and BMI.  Blood pressure.  Lipid and cholesterol levels. These may be checked every 5 years, or more frequently if you are over 42 years old.  Skin check.  Lung cancer screening. You may have this screening every year starting at age 70 if you have a 30-pack-year history of smoking and currently smoke or have quit within the past 15 years.  Fecal occult blood test (FOBT) of the stool. You may have this test every year starting at age 72.  Flexible sigmoidoscopy or colonoscopy. You may have a sigmoidoscopy every 5 years or a colonoscopy every 10 years starting at age 72.  Prostate cancer screening. Recommendations will vary depending on your family history and other risks.  Hepatitis C blood test.  Hepatitis B blood test.  Sexually transmitted disease (STD) testing.  Diabetes screening. This is done by checking your blood sugar (glucose) after you have not eaten for a while (fasting). You may have this done every 1-3 years.  Abdominal aortic aneurysm (AAA) screening. You may need this if you are a current or former smoker.  Osteoporosis. You may be screened starting at age 54 if you are at high risk. Talk with your health care provider about your test results, treatment options, and if necessary, the need for more tests. Vaccines  Your health care provider may recommend  certain vaccines, such as:  Influenza vaccine. This is recommended every year.  Tetanus, diphtheria, and acellular pertussis (Tdap, Td) vaccine. You may need a Td booster every 10 years.  Zoster vaccine. You may need this after age 40.  Pneumococcal 13-valent conjugate (PCV13) vaccine.  One dose is recommended after age 73.  Pneumococcal polysaccharide (PPSV23) vaccine. One dose is recommended after age 47. Talk to your health care provider about which screenings and vaccines you need and how often you need them. This information is not intended to replace advice given to you by your health care provider. Make sure you discuss any questions you have with your health care provider. Document Released: 11/13/2015 Document Revised: 07/06/2016 Document Reviewed: 08/18/2015 Elsevier Interactive Patient Education  2017 [place] Prevention in the Home Falls can cause injuries. They can happen to people of all ages. There are many things you can do to make your home safe and to help prevent falls. What can I do on the outside of my home?  Regularly fix the edges of walkways and driveways and fix any cracks.  Remove anything that might make you trip as you walk through a door, such as a raised step or threshold.  Trim any bushes or trees on the path to your home.  Use bright outdoor lighting.  Clear any walking paths of anything that might make someone trip, such as rocks or tools.  Regularly check to see if handrails are loose or broken. Make sure that both sides of any steps have handrails.  Any raised decks and porches should have guardrails on the edges.  Have any leaves, snow, or ice cleared regularly.  Use sand or salt on walking paths during winter.  Clean up any spills in your garage right away. This includes oil or grease spills. What can I do in the bathroom?  Use night lights.  Install grab bars by the toilet and in the tub and shower. Do not use towel bars as grab bars.  Use non-skid mats or decals in the tub or shower.  If you need to sit down in the shower, use a plastic, non-slip stool.  Keep the floor dry. Clean up any water that spills on the floor as soon as it happens.  Remove soap buildup in the tub or shower regularly.  Attach bath  mats securely with double-sided non-slip rug tape.  Do not have throw rugs and other things on the floor that can make you trip. What can I do in the bedroom?  Use night lights.  Make sure that you have a light by your bed that is easy to reach.  Do not use any sheets or blankets that are too big for your bed. They should not hang down onto the floor.  Have a firm chair that has side arms. You can use this for support while you get dressed.  Do not have throw rugs and other things on the floor that can make you trip. What can I do in the kitchen?  Clean up any spills right away.  Avoid walking on wet floors.  Keep items that you use a lot in easy-to-reach places.  If you need to reach something above you, use a strong step stool that has a grab bar.  Keep electrical cords out of the way.  Do not use floor polish or wax that makes floors slippery. If you must use wax, use non-skid floor wax.  Do not have throw rugs and other  things on the floor that can make you trip. What can I do with my stairs?  Do not leave any items on the stairs.  Make sure that there are handrails on both sides of the stairs and use them. Fix handrails that are broken or loose. Make sure that handrails are as long as the stairways.  Check any carpeting to make sure that it is firmly attached to the stairs. Fix any carpet that is loose or worn.  Avoid having throw rugs at the top or bottom of the stairs. If you do have throw rugs, attach them to the floor with carpet tape.  Make sure that you have a light switch at the top of the stairs and the bottom of the stairs. If you do not have them, ask someone to add them for you. What else can I do to help prevent falls?  Wear shoes that:  Do not have high heels.  Have rubber bottoms.  Are comfortable and fit you well.  Are closed at the toe. Do not wear sandals.  If you use a stepladder:  Make sure that it is fully opened. Do not climb a closed  stepladder.  Make sure that both sides of the stepladder are locked into place.  Ask someone to hold it for you, if possible.  Clearly mark and make sure that you can see:  Any grab bars or handrails.  First and last steps.  Where the edge of each step is.  Use tools that help you move around (mobility aids) if they are needed. These include:  Canes.  Walkers.  Scooters.  Crutches.  Turn on the lights when you go into a dark area. Replace any light bulbs as soon as they burn out.  Set up your furniture so you have a clear path. Avoid moving your furniture around.  If any of your floors are uneven, fix them.  If there are any pets around you, be aware of where they are.  Review your medicines with your doctor. Some medicines can make you feel dizzy. This can increase your chance of falling. Ask your doctor what other things that you can do to help prevent falls. This information is not intended to replace advice given to you by your health care provider. Make sure you discuss any questions you have with your health care provider. Document Released: 08/13/2009 Document Revised: 03/24/2016 Document Reviewed: 11/21/2014 Elsevier Interactive Patient Education  2017 [employer].

## 2018-06-11 ENCOUNTER — Encounter: Payer: Self-pay | Admitting: Family Medicine

## 2018-06-11 ENCOUNTER — Ambulatory Visit: Payer: Self-pay

## 2018-07-04 ENCOUNTER — Other Ambulatory Visit: Payer: Self-pay | Admitting: Family Medicine

## 2018-08-17 DIAGNOSIS — M7062 Trochanteric bursitis, left hip: Secondary | ICD-10-CM | POA: Diagnosis not present

## 2018-08-17 DIAGNOSIS — M5412 Radiculopathy, cervical region: Secondary | ICD-10-CM | POA: Diagnosis not present

## 2018-09-04 DIAGNOSIS — M7062 Trochanteric bursitis, left hip: Secondary | ICD-10-CM | POA: Diagnosis not present

## 2018-09-04 DIAGNOSIS — M5412 Radiculopathy, cervical region: Secondary | ICD-10-CM | POA: Diagnosis not present

## 2018-09-07 IMAGING — CT CT ABD-PELV W/ CM
1 of 3 series · 13 of 32 positions shown, 18 images · IV contrast (APPLIED)
Comparison: 05/21/2015

CLINICAL DATA: Left testicular cancer. Status post left inguinal
orchiectomy.

EXAM:
CT ABDOMEN AND PELVIS WITH CONTRAST
TECHNIQUE: Multidetector CT imaging of the abdomen and pelvis was performed
using the standard protocol following bolus administration of
intravenous contrast.
CONTRAST:  100mL Z1N5H7-PFF IOPAMIDOL (Z1N5H7-PFF) INJECTION 61%

[Series 2: axial st · axial · 0.86mm/px · z∈[-1058,-553]mm · 13 of 115 slices shown, 18 images]
[im 7/115  soft-tissue]
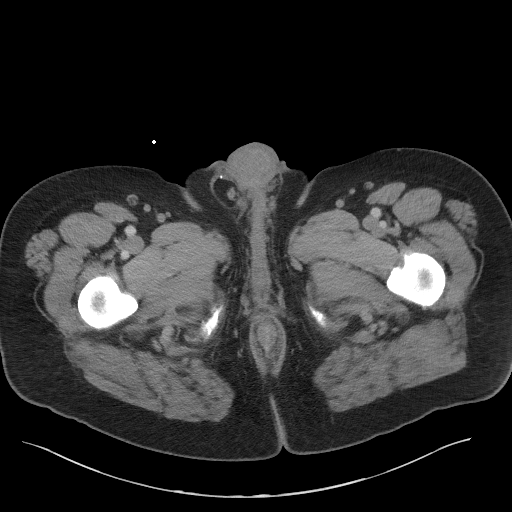
[im 7/115  bone]
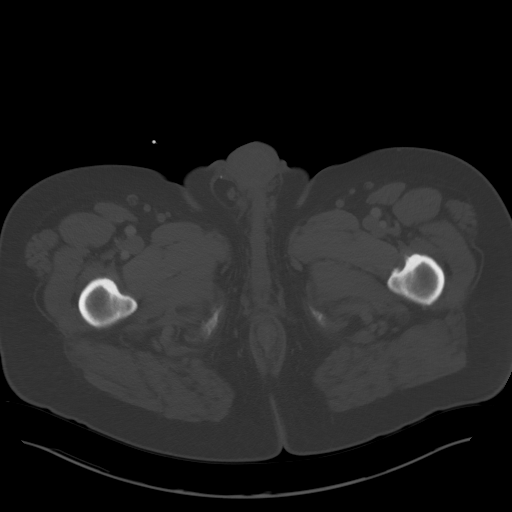
[im 20/115  soft-tissue]
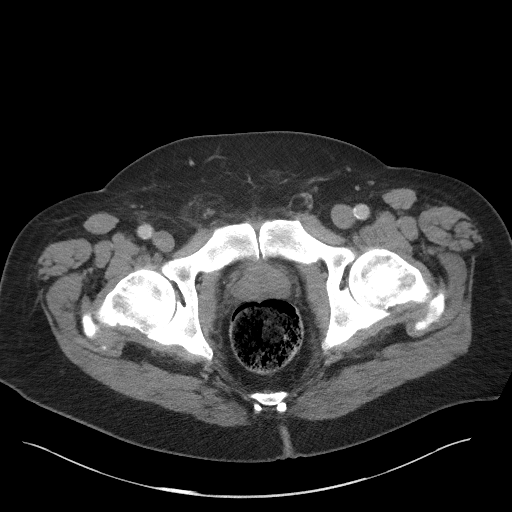
[im 26/115  soft-tissue]
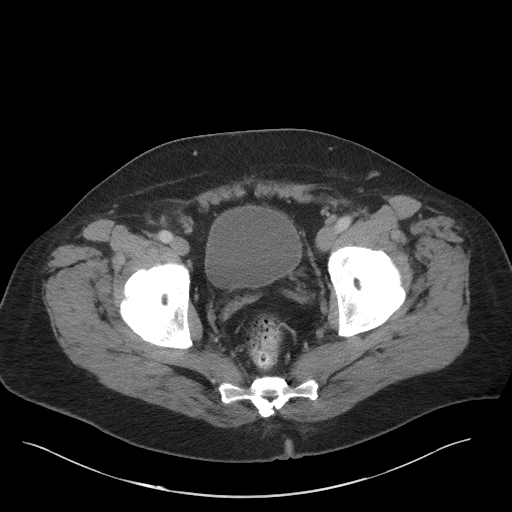
[im 32/115  soft-tissue]
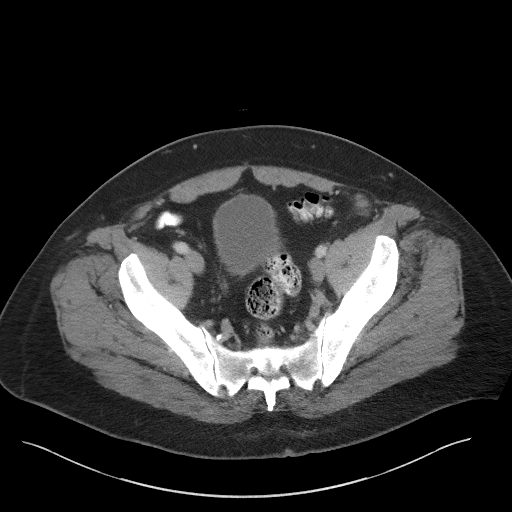
[im 45/115  soft-tissue]
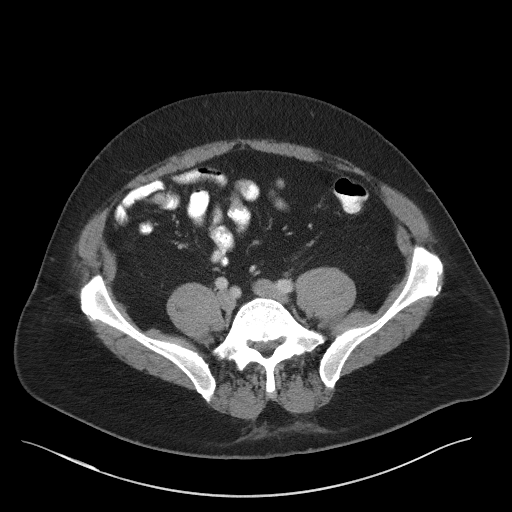
[im 51/115  soft-tissue]
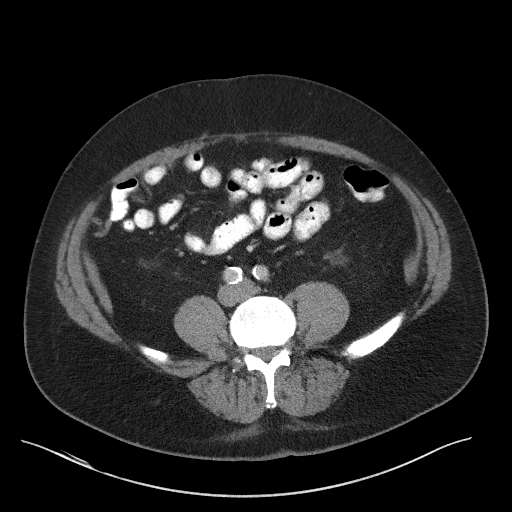
[im 64/115  soft-tissue]
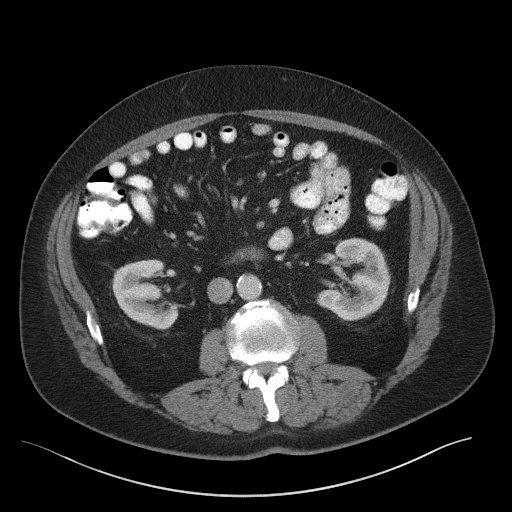
[im 70/115  soft-tissue]
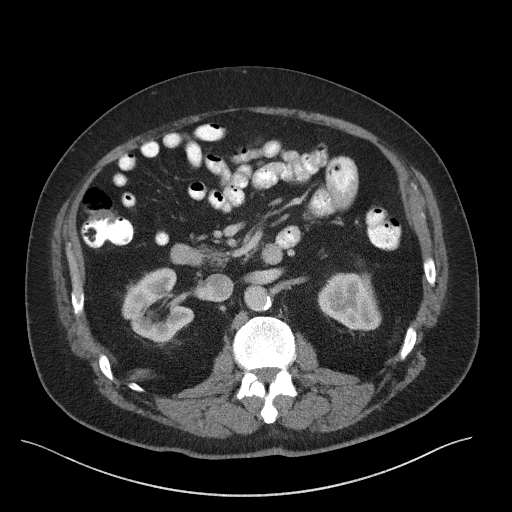
[im 83/115  soft-tissue]
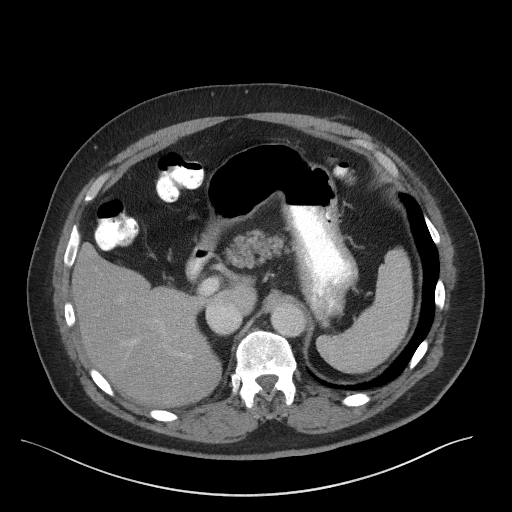
[im 83/115  bone]
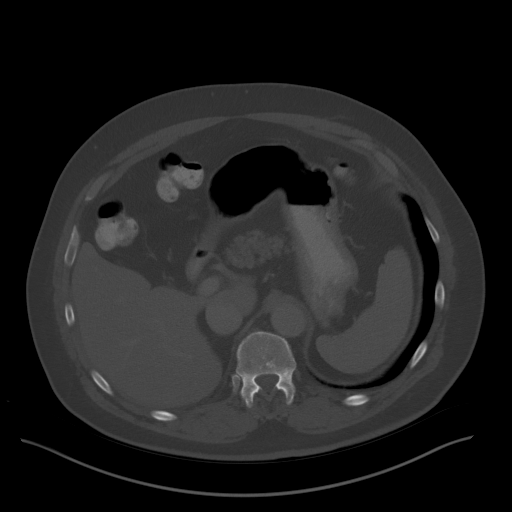
[im 89/115  soft-tissue]
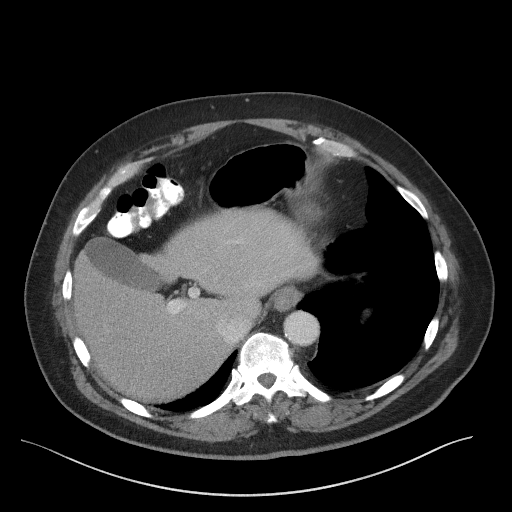
[im 89/115  lung]
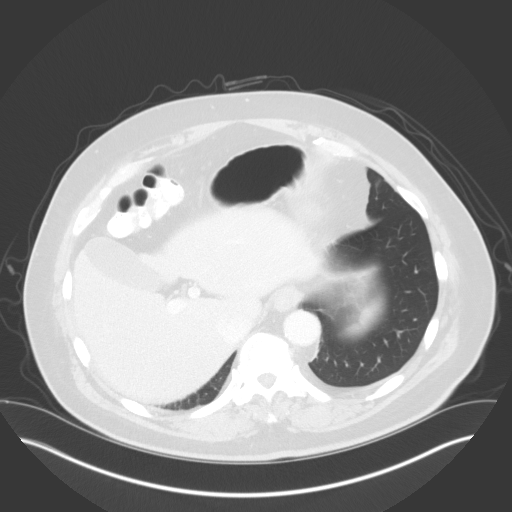
[im 96/115  soft-tissue]
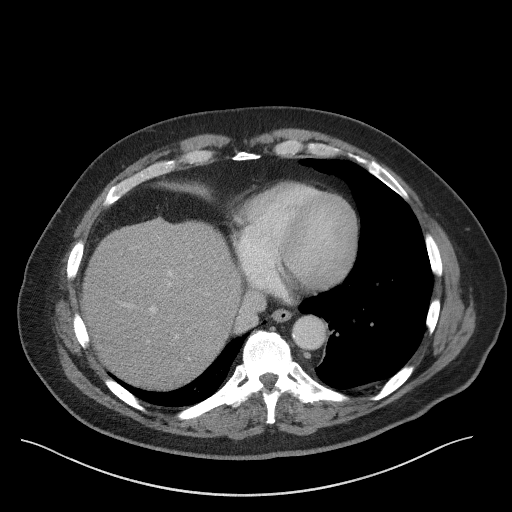
[im 96/115  lung]
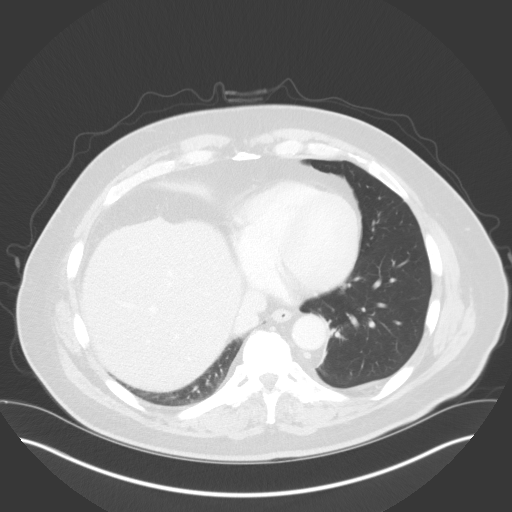
[im 102/115  lung]
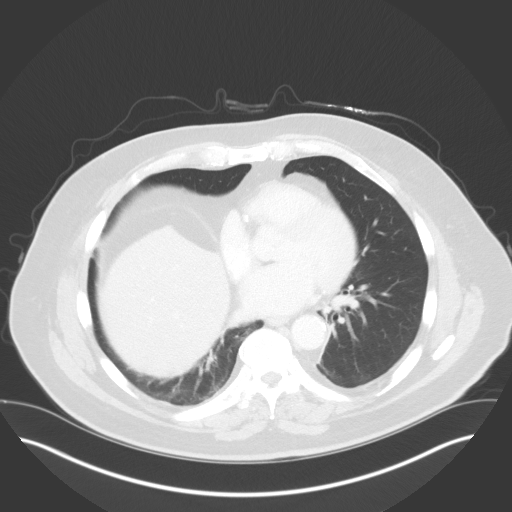
[im 108/115  soft-tissue]
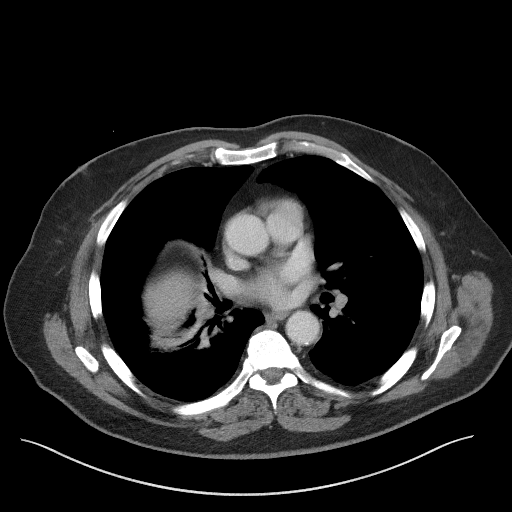
[im 108/115  lung]
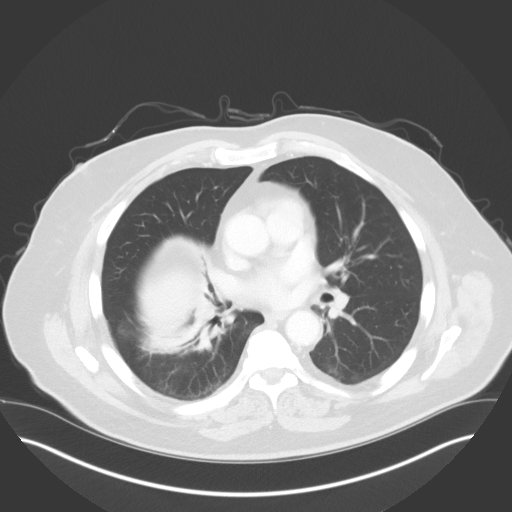

[13 of 32 positions shown; findings below may reference images not displayed]

FINDINGS: Lower chest: Right middle lobe tiny nodule (image 7 series 4) is
unchanged in the interval. Chronic atelectasis or scarring noted
right lung base.

Hepatobiliary: No focal abnormality within the liver parenchyma.
There is no evidence for gallstones, gallbladder wall thickening, or
pericholecystic fluid. No intrahepatic or extrahepatic biliary
dilation.

Pancreas: No focal mass lesion. No dilatation of the main duct. No
intraparenchymal cyst. No peripancreatic edema.

Spleen: No splenomegaly. No focal mass lesion.

Adrenals/Urinary Tract: No adrenal nodule or mass. 5 mm
nonobstructing stone identified lower pole right kidney. Cortical
scarring noted in the kidneys bilaterally. No hydroureteronephrosis.
The urinary bladder appears normal for the degree of distention.

Stomach/Bowel: Stomach is nondistended. No gastric wall thickening.
No evidence of outlet obstruction. Duodenum is normally positioned
as is the ligament of Treitz. No small bowel wall thickening. No
small bowel dilatation. The terminal ileum is normal. Diverticular
changes are noted in the left colon without evidence of
diverticulitis.

Vascular/Lymphatic: There is abdominal aortic atherosclerosis
without aneurysm. There is no gastrohepatic or hepatoduodenal
ligament lymphadenopathy. No intraperitoneal or retroperitoneal
lymphadenopathy. No pelvic sidewall lymphadenopathy.

Reproductive: The prostate gland and seminal vesicles have normal
imaging features.

Other: No intraperitoneal free fluid.

Musculoskeletal: Bone windows reveal no worrisome lytic or sclerotic
osseous lesions. Right inguinal hernia contains only fat. The small
soft tissue focus seen in the left inguinal region previously has
decreased in the interval.
IMPRESSION: 1. Stable exam. No new or progressive findings. Specifically, no
evidence for metastatic disease in the abdomen or pelvis.
2. Small soft tissue density seen the left inguinal canal on the
previous study has decreased in the interval.
3. 5 mm nonobstructing right renal stone. No definite left renal
stone identified on today's exam.
4. Abdominal aortic atherosclerosis.

## 2018-11-08 ENCOUNTER — Ambulatory Visit: Admission: RE | Admit: 2018-11-08 | Payer: Medicare Other | Source: Ambulatory Visit

## 2018-11-08 ENCOUNTER — Other Ambulatory Visit: Payer: Self-pay | Admitting: Family Medicine

## 2018-11-09 ENCOUNTER — Other Ambulatory Visit: Payer: Self-pay | Admitting: Urology

## 2018-11-09 DIAGNOSIS — Z8547 Personal history of malignant neoplasm of testis: Secondary | ICD-10-CM

## 2018-11-20 ENCOUNTER — Ambulatory Visit: Payer: Medicare Other | Admitting: Urology

## 2018-12-13 ENCOUNTER — Other Ambulatory Visit: Payer: Self-pay

## 2018-12-13 ENCOUNTER — Ambulatory Visit
Admission: RE | Admit: 2018-12-13 | Discharge: 2018-12-13 | Disposition: A | Discharge status: Home / Self Care | Payer: Medicare Other | Source: Ambulatory Visit | Attending: Urology | Admitting: Urology

## 2018-12-13 DIAGNOSIS — Z8547 Personal history of malignant neoplasm of testis: Secondary | ICD-10-CM | POA: Insufficient documentation

## 2018-12-13 HISTORY — DX: Unspecified asthma, uncomplicated: J45.909

## 2018-12-13 LAB — POCT I-STAT CREATININE: Creatinine, Ser: 0.9 mg/dL (ref 0.61–1.24)

## 2018-12-13 MED ORDER — IOPAMIDOL (ISOVUE-300) INJECTION 61%
100.0000 mL | Freq: Once | INTRAVENOUS | Status: AC | PRN
  Administered 2018-12-13: 100 mL via INTRAVENOUS

## 2018-12-18 ENCOUNTER — Ambulatory Visit: Payer: Medicare Other | Admitting: Urology

## 2018-12-18 ENCOUNTER — Encounter: Payer: Self-pay | Admitting: Urology

## 2018-12-18 VITALS — BP 158/83 | HR 79 | Ht 73.0 in | Wt 250.6 lb

## 2018-12-18 DIAGNOSIS — Z8547 Personal history of malignant neoplasm of testis: Secondary | ICD-10-CM | POA: Diagnosis not present

## 2018-12-18 NOTE — Progress Notes (Signed)
12/18/2018 1:35 PM   Matthew [name] 01-03-1946 [id number]  Referring provider: Jerrol Banana., MD [address] [place], [place] [address]  Chief Complaint  Patient presents with  . 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 He returns today to review his most recent imaging and lab data for annual follow up.  Pathology for left inguinal radical orchiectomy from 07/02/14 shows mixed germ cell tumor with 40% embryonal, 40% teratoma, 5% yolk sac, and 5% seminoma. Tumor confined to testicle without LVI or local invasion of tunica vaginalis. Tumor markers negative.  He elected surveillance without adjuvant treatment.  Most recent imaging performed on 12/13/2018 was stable without any evidence of metastatic disease (incidental tiny nonobstructing right renal stone).  CXR is negative.    Tumor markers within normal limits.  At one point, he did have an elevated LDH for unclear reasons which returned to baseline.  Regular self testicular exams benign.    Today, he is no longer working.  He is retired.   PMH: Past Medical History:  Diagnosis Date  . Allergic rhinitis, cause unspecified   . Asthma   . Benign neoplasm of colon   . Collapsed lung   . COPD (chronic obstructive pulmonary disease) ([place])   . ED (erectile dysfunction)   . GERD (gastroesophageal reflux disease)   . Hyperlipidemia   . Hypertension   . Internal hemorrhoids without mention of complication   . OSA (obstructive sleep apnea)    wears CPAP  . Sleep apnea    no CPAP  . Testicular cancer ([place]) 07/2014  . Thyroid disease   . Unspecified asthma(493.90)   . Unspecified sinusitis (chronic)     Surgical History: Past Surgical History:  Procedure Laterality Date  . COLONOSCOPY    . DENTAL SURGERY  12/2014  . INGUINAL HERNIA REPAIR    . NOSE SURGERY     submucous surgery  . POLYPECTOMY    . SINUS  SURGERY WITH INSTATRAK    . SUBCLAVIAN BYPASS GRAFT  2009  . TESTICLE REMOVAL      Home Medications:  Allergies as of 12/18/2018   No Known Allergies     Medication List       Accurate as of December 18, 2018  1:35 PM. Always use your most recent med list.        Matthew [name] NA Place into the nose as needed.   ALEVE 220 MG Caps Generic drug:  Naproxen Sodium as needed.   aspirin 81 MG tablet Take 81 mg by mouth daily.   atorvastatin 20 MG tablet Commonly known as:  LIPITOR TAKE 1 TABLET BY MOUTH EVERY DAY   CENTRUM SILVER ADULT 50+ PO Take 1 tablet by mouth daily.   fluticasone 50 MCG/ACT nasal spray Commonly known as:  FLONASE Place 2 sprays into both nostrils daily.   folic acid 326 MCG tablet Commonly known as:  FOLVITE Take 400 mcg by mouth daily.   hydrocortisone 25 MG suppository Commonly known as:  ANUSOL-HC Place 1 suppository (25 mg total) rectally 2 (two) times daily.   ibuprofen 200 MG tablet Commonly known as:  ADVIL,MOTRIN Take 200 mg by mouth every 4 (four) hours as needed.   levalbuterol 45 MCG/ACT inhaler Commonly known as:  XOPENEX HFA Inhale 1-2 puffs into the lungs every 4 (four) hours as needed.   levothyroxine 50 MCG tablet Commonly known  as:  SYNTHROID, LEVOTHROID TAKE 1 TABLET BY MOUTH EVERY DAY   meloxicam 15 MG tablet Commonly known as:  MOBIC Take 15 mg by mouth daily.   methocarbamol 500 MG tablet Commonly known as:  ROBAXIN Take 500 mg by mouth 2 (two) times daily.   MUCINEX SINUS-MAX CONGESTION 5-325-200 MG Tabs Generic drug:  Phenylephrine-APAP-guaiFENesin Per bottle directions as needed   Potassium 75 MG Tabs Take by mouth daily.   sildenafil 20 MG tablet Commonly known as:  REVATIO Take 2 to 4 tablets daily as needed   SYMBICORT 160-4.5 MCG/ACT inhaler Generic drug:  budesonide-formoterol INHALE 1-2 PUFFS TWICE DAILY       Allergies: No Known Allergies  Family History: Family History  Problem  Relation Age of Onset  . Colon cancer Sister 22  . Breast cancer Sister   . Heart attack Sister   . Heart attack Sister        deceased from MI  . Allergies Father   . Heart attack Father   . CAD Father        deceased 57 from fall and possible subdural hematoma  . Congestive Heart Failure Mother   . Emphysema Mother        deceased 73 with emphysema, tobacco abuse  . Asthma Sister   . CAD Brother   . Stroke Brother   . Lung cancer Brother   . Arthritis Sister   . Esophageal cancer Neg Hx   . Stomach cancer Neg Hx   . Rectal cancer Neg Hx     Social History:  reports that he has never smoked. He has never used smokeless tobacco. He reports current alcohol use of about 7.0 - 10.0 standard drinks of alcohol per week. He reports that he does not use drugs.  ROS: UROLOGY Frequent Urination?: No Hard to postpone urination?: No Burning/pain with urination?: No Get up at night to urinate?: No Leakage of urine?: No Urine stream starts and stops?: No Trouble starting stream?: No Do you have to strain to urinate?: No Blood in urine?: No Urinary tract infection?: No Sexually transmitted disease?: No Injury to kidneys or bladder?: No Painful intercourse?: No Weak stream?: No Erection problems?: No Penile pain?: No  Gastrointestinal Nausea?: No Vomiting?: No Indigestion/heartburn?: No Diarrhea?: No Constipation?: No  Constitutional Fever: No Night sweats?: No Weight loss?: No Fatigue?: No  Skin Skin rash/lesions?: No Itching?: No  Eyes Blurred vision?: No Double vision?: No  Ears/Nose/Throat Sore throat?: No Sinus problems?: Yes  Hematologic/Lymphatic Swollen glands?: No Easy bruising?: No  Cardiovascular Leg swelling?: No Chest pain?: No  Respiratory Cough?: No Shortness of breath?: Yes  Endocrine Excessive thirst?: No  Musculoskeletal Back pain?: No Joint pain?: Yes  Neurological Headaches?: No Dizziness?: Yes  Psychologic Depression?:  No Anxiety?: No  Physical Exam: BP (!) 158/83 (BP Location: Left Arm, Patient Position: Sitting, Cuff Size: Large)   Pulse 79   Ht 6\' 1"  (1.854 m)   Wt 250 lb 9.6 oz (113.7 kg)   BMI 33.06 kg/m   Constitutional:  Alert and oriented, No acute distress. HEENT: Matthew [name] AT, moist mucus membranes.  Trachea midline, no masses. Cardiovascular: No clubbing, cyanosis, or edema. Respiratory: Normal respiratory effort, no increased work of breathing. GI: Abdomen is soft, nontender, nondistended, no abdominal masses GU: Solitary testicle without masses, no tenderness.  Abdominal exam and inguinal exam is normal. Skin: No rashes, bruises or suspicious lesions. Neurologic: Grossly intact, no focal deficits, moving all 4 extremities. Psychiatric: Normal mood and  affect.  Laboratory Data: Lab Results  Component Value Date   WBC 6.0 05/17/2018   HGB 14.6 05/17/2018   HCT 41.1 05/17/2018   MCV 97 05/17/2018   PLT 211 05/17/2018    Lab Results  Component Value Date   CREATININE 0.90 12/13/2018    Lab Results  Component Value Date   PSA 0.5 05/09/2014    Pertinent Imaging: CLINICAL DATA:  Testicular cancer.  EXAM: CT ABDOMEN AND PELVIS WITH CONTRAST  TECHNIQUE: Multidetector CT imaging of the abdomen and pelvis was performed using the standard protocol following bolus administration of intravenous contrast.  CONTRAST:  137mL ISOVUE-300 IOPAMIDOL (ISOVUE-300) INJECTION 61%  COMPARISON:  10/26/2017  FINDINGS: Lower chest: Coronary artery calcification is evident. Atherosclerotic calcification is noted in the wall of the thoracic aorta. Atelectasis noted right lower lobe.  Hepatobiliary: No suspicious focal abnormality within the liver parenchyma. There is no evidence for gallstones, gallbladder wall thickening, or pericholecystic fluid. No intrahepatic or extrahepatic biliary dilation.  Pancreas: No focal mass lesion. No dilatation of the main duct. No intraparenchymal  cyst. No peripancreatic edema.  Spleen: No splenomegaly. No focal mass lesion.  Adrenals/Urinary Tract: No adrenal nodule or mass. 3 mm nonobstructing stone identified lower pole right kidney. Left kidney unremarkable. No evidence for hydroureter. The urinary bladder appears normal for the degree of distention.  Stomach/Bowel: Stomach is unremarkable. No gastric wall thickening. No evidence of outlet obstruction. Duodenum is normally positioned as is the ligament of Treitz. No small bowel wall thickening. No small bowel dilatation. The terminal ileum is normal. The appendix is normal. No gross colonic mass. No colonic wall thickening. Diverticular changes are noted in the left colon without evidence of diverticulitis.  Vascular/Lymphatic: There is abdominal aortic atherosclerosis without aneurysm. There is no gastrohepatic or hepatoduodenal ligament lymphadenopathy. No intraperitoneal or retroperitoneal lymphadenopathy. No pelvic sidewall lymphadenopathy.  Reproductive: The prostate gland and seminal vesicles are unremarkable.  Other: No intraperitoneal free fluid.  Musculoskeletal: Right groin hernia contains only fat. No worrisome lytic or sclerotic osseous abnormality.  IMPRESSION: 1. Stable. No acute or progressive interval findings. No evidence for metastatic disease in the abdomen or pelvis. 2. Tiny nonobstructing right renal stone. 3.  Aortic Atherosclerois (ICD10-170.0)   Electronically Signed   By: Misty Stanley M.D.   On: 12/13/2018 10:05   CXR CLINICAL DATA:  History of testicular cancer for yearly chest x-ray.  EXAM: CHEST  1 VIEW  COMPARISON:  10/26/2017  FINDINGS: Lungs are adequately inflated with stable elevation of the right hemidiaphragm. No focal consolidation, effusion or nodules. Cardiomediastinal silhouette and remainder the exam is unchanged.  IMPRESSION: No active disease.   Electronically Signed   By: Marin Olp  M.D.   On: 12/13/2018 13:22  CT scan imaging was personally reviewed today as well as chest x-ray.  Assessment & Plan:    1. History of testicular cancer  NED For today although will likely be normal He will meet his 5-year anniversary later this year We will plan for 1 last CT as well as KUB next year with tumor markers and at that point, he may be discharged from further surveillance Patient is agreeable this plan Continue self testicular exams - CT Abdomen Pelvis W Contrast; Future - Chest 1 View; Future - Beta hCG quant (ref lab); Future - AFP tumor marker - Beta hCG quant (ref lab)   Return in about 1 year (around 12/19/2019) for CT abdomen pelvis/chest x-ray/tumor markers.  Hollice Espy, MD  [facility]  [address], [place], [place] [address] [phone]

## 2018-12-19 ENCOUNTER — Telehealth: Payer: Self-pay | Admitting: *Deleted

## 2018-12-19 ENCOUNTER — Ambulatory Visit: Payer: Medicare Other | Admitting: Urology

## 2018-12-19 LAB — AFP TUMOR MARKER: AFP, Serum, Tumor Marker: 4.6 ng/mL (ref 0.0–8.3)

## 2018-12-19 LAB — BETA HCG QUANT (REF LAB): hCG Quant: <1 m[IU]/mL (ref 0–3)

## 2018-12-19 NOTE — Telephone Encounter (Signed)
Notified patient as instructed, patient pleased. Discussed follow-up appointments, patient agrees  

## 2018-12-19 NOTE — Telephone Encounter (Signed)
-----   Message from Hollice Espy, MD sent at 12/19/2018  8:01 AM EST ----- Tumor markers are fine!  Hollice Espy, MD

## 2018-12-24 ENCOUNTER — Other Ambulatory Visit: Payer: Self-pay | Admitting: Family Medicine

## 2018-12-24 MED ORDER — MOMETASONE FURO-FORMOTEROL FUM 100-5 MCG/ACT IN AERO: 2.0000 | INHALATION_SPRAY | Freq: Every day | RESPIRATORY_TRACT | 12 refills | Status: DC

## 2018-12-24 NOTE — Telephone Encounter (Signed)
ok 

## 2018-12-24 NOTE — Telephone Encounter (Signed)
[facility] faxed refill request for the following medications:  Dulera 100 mcg-5 mcg inhaler  Comment from pharmacy:  Pateint requests new rx; old rx pt wants renewal  Please advise.

## 2018-12-24 NOTE — Telephone Encounter (Signed)
Patient is on Symbicort and Xopenex.  He is asking for [facility].  Do you want to put it in place of Symbicort or add to ?

## 2019-01-14 DIAGNOSIS — M7062 Trochanteric bursitis, left hip: Secondary | ICD-10-CM | POA: Diagnosis not present

## 2019-01-17 DIAGNOSIS — M25552 Pain in left hip: Secondary | ICD-10-CM | POA: Diagnosis not present

## 2019-01-28 ENCOUNTER — Other Ambulatory Visit: Payer: Self-pay

## 2019-01-28 MED ORDER — ALBUTEROL SULFATE HFA 108 (90 BASE) MCG/ACT IN AERS: 2.0000 | INHALATION_SPRAY | RESPIRATORY_TRACT | 12 refills | Status: DC | PRN

## 2019-06-03 ENCOUNTER — Encounter: Payer: Self-pay | Admitting: Internal Medicine

## 2019-06-12 ENCOUNTER — Encounter: Payer: Self-pay | Admitting: Internal Medicine

## 2019-06-12 NOTE — Progress Notes (Signed)
Subjective:   Matthew [name] is a 73 y.o. male who presents for Medicare Annual/Subsequent preventive examination.      Review of Systems:  N/A  Cardiac Risk Factors include: advanced age (>4men, >67 women);dyslipidemia;male gender;sedentary lifestyle;obesity (BMI >30kg/m2)     Objective:    Vitals: BP 120/72   Pulse 69   Temp 98.8 F (37.1 C) (Oral)   Ht 6\' 1"  (1.854 m)   Wt 258 lb (117 kg)   BMI 34.04 kg/m   Body mass index is 34.04 kg/m.   Advanced Directives 06/13/2019 06/06/2018 05/24/2017 05/17/2017 10/03/2016  Does Patient Have a Medical Advance Directive? Yes Yes Yes Yes Yes  Type of Paramedic of [place];Living will [place];Living will [place];Living will [place];Living will Living will;Healthcare Power of [place] in Chart? No - copy requested No - copy requested No - copy requested - -    Tobacco Social History   Tobacco Use  Smoking Status Never Smoker  Smokeless Tobacco Never Used     Counseling given: Not Answered   Clinical Intake:  Pre-visit preparation completed: Yes  Pain : No/denies pain Pain Score: 0-No pain     Nutritional Status: BMI > 30  Obese Nutritional Risks: None Diabetes: No  How often do you need to have someone help you when you read instructions, pamphlets, or other written materials from your doctor or pharmacy?: 1 - Never  Interpreter Needed?: No  Information entered by :: [facility], LPN  Past Medical History:  Diagnosis Date  . Allergic rhinitis, cause unspecified   . Asthma   . Benign neoplasm of colon   . Collapsed lung   . COPD (chronic obstructive pulmonary disease) ([place])   . ED (erectile dysfunction)   . GERD (gastroesophageal reflux disease)   . Hyperlipidemia   . Hypertension   . Internal hemorrhoids without mention of complication   . OSA (obstructive sleep apnea)    wears CPAP   . Sleep apnea    no CPAP  . Testicular cancer ([place]) 07/2014  . Thyroid disease   . Unspecified asthma(493.90)   . Unspecified sinusitis (chronic)    Past Surgical History:  Procedure Laterality Date  . COLONOSCOPY    . DENTAL SURGERY  12/2014  . INGUINAL HERNIA REPAIR    . NOSE SURGERY     submucous surgery  . POLYPECTOMY    . SINUS SURGERY WITH INSTATRAK    . SUBCLAVIAN BYPASS GRAFT  2009  . TESTICLE REMOVAL     Family History  Problem Relation Age of Onset  . Colon cancer Sister 23  . Breast cancer Sister   . Heart attack Sister   . Heart attack Sister        deceased from MI  . Allergies Father   . Heart attack Father   . CAD Father        deceased 77 from fall and possible subdural hematoma  . Congestive Heart Failure Mother   . Emphysema Mother        deceased 40 with emphysema, tobacco abuse  . Asthma Sister   . CAD Brother   . Stroke Brother   . Lung cancer Brother   . Arthritis Sister   . Esophageal cancer Neg Hx   . Stomach cancer Neg Hx   . Rectal cancer Neg Hx    Social History   Socioeconomic History  . Marital status:  Married    Spouse name: Not on file  . Number of children: 2  . Years of education: Not on file  . Highest education level: 12th grade  Occupational History  . Occupation: retired    Comment: Barista  . Financial resource strain: Not hard at all  . Food insecurity    Worry: Never true    Inability: Never true  . Transportation needs    Medical: No    Non-medical: No  Tobacco Use  . Smoking status: Never Smoker  . Smokeless tobacco: Never Used  Substance and Sexual Activity  . Alcohol use: Yes    Alcohol/week: 7.0 - 10.0 standard drinks    Types: 7 - 10 Shots of liquor per week  . Drug use: No  . Sexual activity: Yes    Birth control/protection: None  Lifestyle  . Physical activity    Days per week: 0 days    Minutes per session: 0 min  . Stress: Not at all  Relationships  . Social Product manager on phone: Patient refused    Gets together: Patient refused    Attends religious service: Patient refused    Active member of club or organization: Patient refused    Attends meetings of clubs or organizations: Patient refused    Relationship status: Patient refused  Other Topics Concern  . Not on file  Social History Narrative  . Not on file    Outpatient Encounter Medications as of 06/13/2019  Medication Sig  . aspirin 81 MG tablet Take 81 mg by mouth daily.  Marland Kitchen atorvastatin (LIPITOR) 20 MG tablet TAKE 1 TABLET BY MOUTH EVERY DAY  . fluticasone (FLONASE) 50 MCG/ACT nasal spray Place 2 sprays into both nostrils daily. (Patient taking differently: Place 2 sprays into both nostrils daily. As needed)  . folic acid (FOLVITE) 937 MCG tablet Take 400 mcg by mouth daily.  Marland Kitchen ibuprofen (ADVIL,MOTRIN) 200 MG tablet Take 200 mg by mouth every 4 (four) hours as needed.   . levalbuterol (XOPENEX HFA) 45 MCG/ACT inhaler Inhale 1-2 puffs into the lungs every 6 (six) hours as needed for wheezing.  Marland Kitchen levothyroxine (SYNTHROID, LEVOTHROID) 50 MCG tablet TAKE 1 TABLET BY MOUTH EVERY DAY  . magnesium oxide (MAG-OX) 400 MG tablet Take 400 mg by mouth 2 (two) times daily.  . meloxicam (MOBIC) 7.5 MG tablet Take 7.5 mg by mouth 2 (two) times daily.  . Multiple Vitamins-Minerals (CENTRUM SILVER ADULT 50+ PO) Take 1 tablet by mouth daily.  . Naproxen Sodium (ALEVE) 220 MG CAPS as needed.  . Oxymetazoline HCl (AFRIN NASAL SPRAY NA) Place into the nose as needed.    Marland Kitchen Phenylephrine-APAP-Guaifenesin (MUCINEX SINUS-MAX CONGESTION) 5-325-200 MG TABS Per bottle directions as needed  . Potassium 75 MG TABS Take 99 mg by mouth daily.   . SYMBICORT 160-4.5 MCG/ACT inhaler INHALE 1-2 PUFFS TWICE DAILY  . albuterol (PROAIR HFA) 108 (90 Base) MCG/ACT inhaler Inhale 2 puffs into the lungs every 4 (four) hours as needed.  . hydrocortisone (ANUSOL-HC) 25 MG suppository Place 1 suppository (25 mg total) rectally 2 (two)  times daily. (Patient not taking: Reported on 06/13/2019)  . meloxicam (MOBIC) 15 MG tablet Take 15 mg by mouth daily.  . methocarbamol (ROBAXIN) 500 MG tablet Take 500 mg by mouth 2 (two) times daily.  . mometasone-formoterol (DULERA) 100-5 MCG/ACT AERO Inhale 2 puffs into the lungs daily. (Patient not taking: Reported on 06/13/2019)  . sildenafil (REVATIO) 20 MG  tablet Take 2 to 4 tablets daily as needed (Patient not taking: Reported on 06/13/2019)   No facility-administered encounter medications on file as of 06/13/2019.     Activities of Daily Living In your present state of health, do you have any difficulty performing the following activities: 06/13/2019  Hearing? Y  Comment Has hearing aids but does not wear them.  Vision? N  Comment Wears glasses and readers as needed.  Difficulty concentrating or making decisions? N  Walking or climbing stairs? Y  Comment Due to left hip pain.  Dressing or bathing? N  Doing errands, shopping? N  Preparing Food and eating ? N  Using the Toilet? N  In the past six months, have you accidently leaked urine? N  Do you have problems with loss of bowel control? N  Managing your Medications? N  Managing your Finances? N  Housekeeping or managing your Housekeeping? N  Some recent data might be hidden    Patient Care Team: Jerrol Banana., MD as PCP - General (Unknown Physician Specialty) [place], [place] as Consulting Physician (Optometry) Irene Shipper, MD as Consulting Physician (Gastroenterology)   Assessment:   This is a routine wellness examination for [place].  Exercise Activities and Dietary recommendations Current Exercise Habits: The patient does not participate in regular exercise at present, Exercise limited by: orthopedic condition(s)  Goals    . DIET - INCREASE WATER INTAKE     Recommend increasing water intake to 4-6 glasses a day.        Fall Risk: Fall Risk  06/13/2019 06/06/2018 05/24/2017 02/17/2016  Falls in the  past year? 0 No No No    FALL RISK PREVENTION PERTAINING TO THE HOME:  Any stairs in or around the home? Yes  If so, are there any without handrails? No   Home free of loose throw rugs in walkways, pet beds, electrical cords, etc? Yes  Adequate lighting in your home to reduce risk of falls? Yes   ASSISTIVE DEVICES UTILIZED TO PREVENT FALLS:  Life alert? No  Use of a cane, walker or w/c? No  Grab bars in the bathroom? Yes  Shower chair or bench in shower? Yes  Elevated toilet seat or a handicapped toilet? Yes   TIMED UP AND GO:  Was the test performed? No .    Depression Screen PHQ 2/9 Scores 06/13/2019 06/06/2018 06/06/2018 05/24/2017  PHQ - 2 Score 0 1 1 0  PHQ- 9 Score - 4 - 5    Cognitive Function: Declined today.      6CIT Screen 05/24/2017  What Year? 0 points  What month? 0 points  What time? 3 points  Count back from 20 0 points  Months in reverse 0 points  Repeat phrase 0 points  Total Score 3    Immunization History  Administered Date(s) Administered  . Influenza Whole 07/31/2012  . Influenza, High Dose Seasonal PF 10/10/2016, 09/26/2017  . Pneumococcal Conjugate-13 08/04/2014  . Pneumococcal Polysaccharide-23 05/24/2017  . Td 07/30/2004  . Tdap 08/04/2014, 11/24/2016    Qualifies for Shingles Vaccine? Yes . Due for Shingrix. Education has been provided regarding the importance of this vaccine. Pt has been advised to call insurance company to determine out of pocket expense. Advised may also receive vaccine at local pharmacy or Health Dept. Verbalized acceptance and understanding.  Tdap: Up to date  Flu Vaccine: Due fall 2020  Pneumococcal Vaccine: Completed series  Screening Tests Health Maintenance  Topic Date Due  .  COLONOSCOPY  06/01/2019  . INFLUENZA VACCINE  06/01/2019  . TETANUS/TDAP  11/24/2026  . Hepatitis C Screening  Completed  . PNA vac Low Risk Adult  Completed   Cancer Screenings:  Colorectal Screening: Completed 05/31/17. Repeat  every 2 years. Colonoscopy scheduled for 07/17/19.  Lung Cancer Screening: (Low Dose CT Chest recommended if Age 89-80 years, 30 pack-year currently smoking OR have quit w/in 15years.) does not qualify.   Additional Screening:  Hepatitis C Screening: Up to date  Vision Screening: Recommended annual ophthalmology exams for early detection of glaucoma and other disorders of the eye.  Dental Screening: Recommended annual dental exams for proper oral hygiene  Community Resource Referral:  CRR required this visit?  No        Plan:  I have personally reviewed and addressed the Medicare Annual Wellness questionnaire and have noted the following in the patient's chart:  A. Medical and social history B. Use of alcohol, tobacco or illicit drugs  C. Current medications and supplements D. Functional ability and status E.  Nutritional status F.  Physical activity G. Advance directives H. List of other physicians I.  Hospitalizations, surgeries, and ER visits in previous 12 months J.  [place] such as hearing and vision if needed, cognitive and depression L. Referrals and appointments   In addition, I have reviewed and discussed with patient certain preventive protocols, quality metrics, and best practice recommendations. A written personalized care plan for preventive services as well as general preventive health recommendations were provided to patient.   Glendora Score, LPN  7/54/4920 Nurse Health Advisor   Nurse Notes: Colonoscopy scheduled for 07/17/19.

## 2019-06-13 ENCOUNTER — Encounter: Payer: Self-pay | Admitting: Family Medicine

## 2019-06-13 ENCOUNTER — Ambulatory Visit (INDEPENDENT_AMBULATORY_CARE_PROVIDER_SITE_OTHER): Payer: Medicare Other

## 2019-06-13 ENCOUNTER — Ambulatory Visit (INDEPENDENT_AMBULATORY_CARE_PROVIDER_SITE_OTHER): Payer: Medicare Other | Admitting: Family Medicine

## 2019-06-13 ENCOUNTER — Other Ambulatory Visit: Payer: Self-pay

## 2019-06-13 VITALS — BP 120/72 | HR 69 | Temp 98.8°F | Resp 16 | Ht 73.0 in | Wt 258.0 lb

## 2019-06-13 VITALS — BP 120/72 | HR 69 | Temp 98.8°F | Ht 73.0 in | Wt 258.0 lb

## 2019-06-13 DIAGNOSIS — Z0000 Encounter for general adult medical examination without abnormal findings: Secondary | ICD-10-CM

## 2019-06-13 DIAGNOSIS — E785 Hyperlipidemia, unspecified: Secondary | ICD-10-CM | POA: Diagnosis not present

## 2019-06-13 DIAGNOSIS — E039 Hypothyroidism, unspecified: Secondary | ICD-10-CM

## 2019-06-13 DIAGNOSIS — C61 Malignant neoplasm of prostate: Secondary | ICD-10-CM

## 2019-06-13 DIAGNOSIS — I10 Essential (primary) hypertension: Secondary | ICD-10-CM

## 2019-06-13 DIAGNOSIS — J4520 Mild intermittent asthma, uncomplicated: Secondary | ICD-10-CM | POA: Diagnosis not present

## 2019-06-13 MED ORDER — LEVALBUTEROL TARTRATE 45 MCG/ACT IN AERO: 1.0000 | INHALATION_SPRAY | Freq: Four times a day (QID) | RESPIRATORY_TRACT | 11 refills | Status: AC | PRN

## 2019-06-13 NOTE — Patient Instructions (Signed)
[name] , Thank you for taking time to come for your Medicare Wellness Visit. I appreciate your ongoing commitment to your health goals. Please review the following plan we discussed and let me know if I can assist you in the future.   Screening recommendations/referrals: Colonoscopy: Scheduled for 07/17/19. Recommended yearly ophthalmology/optometry visit for glaucoma screening and checkup Recommended yearly dental visit for hygiene and checkup  Vaccinations: Influenza vaccine: Due fall 2020 Pneumococcal vaccine: Completed series Tdap vaccine: Up to date, due 10/2026 Shingles vaccine: Pt declines today.     Advanced directives: Please bring a copy of your POA (Power of Attorney) and/or Living Will to your next appointment.   Conditions/risks identified: Continue to increase water intake to 6-8 8 oz glasses a day as tolerated.   Next appointment: 9:40 AM with Dr Rosanna Randy.   Preventive Care 73 Years and Older, Male Preventive care refers to lifestyle choices and visits with your health care provider that can promote health and wellness. What does preventive care include?  A yearly physical exam. This is also called an annual well check.  Dental exams once or twice a year.  Routine eye exams. Ask your health care provider how often you should have your eyes checked.  Personal lifestyle choices, including:  Daily care of your teeth and gums.  Regular physical activity.  Eating a healthy diet.  Avoiding tobacco and drug use.  Limiting alcohol use.  Practicing safe sex.  Taking low doses of aspirin every day.  Taking vitamin and mineral supplements as recommended by your health care provider. What happens during an annual well check? The services and screenings done by your health care provider during your annual well check will depend on your age, overall health, lifestyle risk factors, and family history of disease. Counseling  Your health care provider may ask you  questions about your:  Alcohol use.  Tobacco use.  Drug use.  Emotional well-being.  Home and relationship well-being.  Sexual activity.  Eating habits.  History of falls.  Memory and ability to understand (cognition).  Work and work Statistician. Screening  You may have the following tests or measurements:  Height, weight, and BMI.  Blood pressure.  Lipid and cholesterol levels. These may be checked every 5 years, or more frequently if you are over 63 years old.  Skin check.  Lung cancer screening. You may have this screening every year starting at age 56 if you have a 30-pack-year history of smoking and currently smoke or have quit within the past 15 years.  Fecal occult blood test (FOBT) of the stool. You may have this test every year starting at age 11.  Flexible sigmoidoscopy or colonoscopy. You may have a sigmoidoscopy every 5 years or a colonoscopy every 10 years starting at age 78.  Prostate cancer screening. Recommendations will vary depending on your family history and other risks.  Hepatitis C blood test.  Hepatitis B blood test.  Sexually transmitted disease (STD) testing.  Diabetes screening. This is done by checking your blood sugar (glucose) after you have not eaten for a while (fasting). You may have this done every 1-3 years.  Abdominal aortic aneurysm (AAA) screening. You may need this if you are a current or former smoker.  Osteoporosis. You may be screened starting at age 22 if you are at high risk. Talk with your health care provider about your test results, treatment options, and if necessary, the need for more tests. Vaccines  Your health care provider may recommend  certain vaccines, such as:  Influenza vaccine. This is recommended every year.  Tetanus, diphtheria, and acellular pertussis (Tdap, Td) vaccine. You may need a Td booster every 10 years.  Zoster vaccine. You may need this after age 52.  Pneumococcal 13-valent conjugate  (PCV13) vaccine. One dose is recommended after age 56.  Pneumococcal polysaccharide (PPSV23) vaccine. One dose is recommended after age 34. Talk to your health care provider about which screenings and vaccines you need and how often you need them. This information is not intended to replace advice given to you by your health care provider. Make sure you discuss any questions you have with your health care provider. Document Released: 11/13/2015 Document Revised: 07/06/2016 Document Reviewed: 08/18/2015 Elsevier Interactive Patient Education  2017 [place] Prevention in the Home Falls can cause injuries. They can happen to people of all ages. There are many things you can do to make your home safe and to help prevent falls. What can I do on the outside of my home?  Regularly fix the edges of walkways and driveways and fix any cracks.  Remove anything that might make you trip as you walk through a door, such as a raised step or threshold.  Trim any bushes or trees on the path to your home.  Use bright outdoor lighting.  Clear any walking paths of anything that might make someone trip, such as rocks or tools.  Regularly check to see if handrails are loose or broken. Make sure that both sides of any steps have handrails.  Any raised decks and porches should have guardrails on the edges.  Have any leaves, snow, or ice cleared regularly.  Use sand or salt on walking paths during winter.  Clean up any spills in your garage right away. This includes oil or grease spills. What can I do in the bathroom?  Use night lights.  Install grab bars by the toilet and in the tub and shower. Do not use towel bars as grab bars.  Use non-skid mats or decals in the tub or shower.  If you need to sit down in the shower, use a plastic, non-slip stool.  Keep the floor dry. Clean up any water that spills on the floor as soon as it happens.  Remove soap buildup in the tub or shower  regularly.  Attach bath mats securely with double-sided non-slip rug tape.  Do not have throw rugs and other things on the floor that can make you trip. What can I do in the bedroom?  Use night lights.  Make sure that you have a light by your bed that is easy to reach.  Do not use any sheets or blankets that are too big for your bed. They should not hang down onto the floor.  Have a firm chair that has side arms. You can use this for support while you get dressed.  Do not have throw rugs and other things on the floor that can make you trip. What can I do in the kitchen?  Clean up any spills right away.  Avoid walking on wet floors.  Keep items that you use a lot in easy-to-reach places.  If you need to reach something above you, use a strong step stool that has a grab bar.  Keep electrical cords out of the way.  Do not use floor polish or wax that makes floors slippery. If you must use wax, use non-skid floor wax.  Do not have throw rugs and other  things on the floor that can make you trip. What can I do with my stairs?  Do not leave any items on the stairs.  Make sure that there are handrails on both sides of the stairs and use them. Fix handrails that are broken or loose. Make sure that handrails are as long as the stairways.  Check any carpeting to make sure that it is firmly attached to the stairs. Fix any carpet that is loose or worn.  Avoid having throw rugs at the top or bottom of the stairs. If you do have throw rugs, attach them to the floor with carpet tape.  Make sure that you have a light switch at the top of the stairs and the bottom of the stairs. If you do not have them, ask someone to add them for you. What else can I do to help prevent falls?  Wear shoes that:  Do not have high heels.  Have rubber bottoms.  Are comfortable and fit you well.  Are closed at the toe. Do not wear sandals.  If you use a stepladder:  Make sure that it is fully  opened. Do not climb a closed stepladder.  Make sure that both sides of the stepladder are locked into place.  Ask someone to hold it for you, if possible.  Clearly mark and make sure that you can see:  Any grab bars or handrails.  First and last steps.  Where the edge of each step is.  Use tools that help you move around (mobility aids) if they are needed. These include:  Canes.  Walkers.  Scooters.  Crutches.  Turn on the lights when you go into a dark area. Replace any light bulbs as soon as they burn out.  Set up your furniture so you have a clear path. Avoid moving your furniture around.  If any of your floors are uneven, fix them.  If there are any pets around you, be aware of where they are.  Review your medicines with your doctor. Some medicines can make you feel dizzy. This can increase your chance of falling. Ask your doctor what other things that you can do to help prevent falls. This information is not intended to replace advice given to you by your health care provider. Make sure you discuss any questions you have with your health care provider. Document Released: 08/13/2009 Document Revised: 03/24/2016 Document Reviewed: 11/21/2014 Elsevier Interactive Patient Education  2017 [employer].

## 2019-06-13 NOTE — Progress Notes (Signed)
Patient: Matthew [name], Male    DOB: 1946-08-29, 73 y.o.   MRN: [id number] Visit Date: 06/13/2019  Today's Provider: Wilhemena Durie, MD   Chief Complaint  Patient presents with  . Medicare Wellness  AWE done by [facility] prior to visit Subjective:     Annual wellness visit Matthew [name] is a 73 y.o. male. He feels well. He reports exercising yes daily. He reports he is sleeping well. 05/24/2017 CPE  05/31/2017 Colonoscopy-Multiple diverticula were found in the left colon, and a few in  the right colon.  - Internal hemorrhoids were found during retroflexion. The hemorrhoids were small. - The exam was otherwise without abnormality on direct and retroflexion views. Repeat colonoscopy in 2 years for surveillance.  -----------------------------------------------------------  Patient c/o worsening left hip pain. Patient reports taking meloxicam reports ,moderate pain control. Patient reports pain is worse at night time.   Review of Systems  Constitutional: Negative.   HENT: Positive for congestion, hearing loss, postnasal drip, rhinorrhea and tinnitus.   Eyes: Positive for photophobia and itching.  Respiratory: Positive for apnea, cough and shortness of breath.   Cardiovascular: Negative.   Gastrointestinal: Positive for anal bleeding.  Endocrine: Positive for heat intolerance.  Genitourinary: Negative.   Musculoskeletal: Positive for arthralgias and myalgias.  Skin: Negative.   Allergic/Immunologic: Negative.   Neurological: Positive for headaches.  Hematological: Negative.   Psychiatric/Behavioral: Positive for sleep disturbance.    Social History   Socioeconomic History  . Marital status: Married    Spouse name: Not on file  . Number of children: 2  . Years of education: Not on file  . Highest education level: 12th grade  Occupational History  . Occupation: retired    Comment: Barista  . Financial resource strain: Not hard at all  .  Food insecurity    Worry: Never true    Inability: Never true  . Transportation needs    Medical: No    Non-medical: No  Tobacco Use  . Smoking status: Never Smoker  . Smokeless tobacco: Never Used  Substance and Sexual Activity  . Alcohol use: Yes    Alcohol/week: 7.0 - 10.0 standard drinks    Types: 7 - 10 Shots of liquor per week  . Drug use: No  . Sexual activity: Yes    Birth control/protection: None  Lifestyle  . Physical activity    Days per week: Not on file    Minutes per session: Not on file  . Stress: Not at all  Relationships  . Social Herbalist on phone: Not on file    Gets together: Not on file    Attends religious service: Not on file    Active member of club or organization: Not on file    Attends meetings of clubs or organizations: Not on file    Relationship status: Not on file  . Intimate partner violence    Fear of current or ex partner: Not on file    Emotionally abused: Not on file    Physically abused: Not on file    Forced sexual activity: Not on file  Other Topics Concern  . Not on file  Social History Narrative  . Not on file    Past Medical History:  Diagnosis Date  . Allergic rhinitis, cause unspecified   . Asthma   . Benign neoplasm of colon   . Collapsed lung   . COPD (chronic obstructive pulmonary disease) (  [place])   . ED (erectile dysfunction)   . GERD (gastroesophageal reflux disease)   . Hyperlipidemia   . Hypertension   . Internal hemorrhoids without mention of complication   . OSA (obstructive sleep apnea)    wears CPAP  . Sleep apnea    no CPAP  . Testicular cancer ([place]) 07/2014  . Thyroid disease   . Unspecified asthma(493.90)   . Unspecified sinusitis (chronic)      Patient Active Problem List   Diagnosis Date Noted  . Non-seminomatous testicular cancer ([place]) 05/22/2015  . Arteriosclerosis of coronary artery 05/14/2015  . Allergic rhinitis 03/11/2015  . Airway hyperreactivity 03/11/2015  . Benign  essential tremor 03/11/2015  . Benign neoplasm of colon 03/11/2015  . Failure of erection 03/11/2015  . Esophagitis, reflux 03/11/2015  . Essential (primary) hypertension 03/11/2015  . Gastro-esophageal reflux disease without esophagitis 03/11/2015  . HLD (hyperlipidemia) 03/11/2015  . Cannot sleep 03/11/2015  . Adult hypothyroidism 03/11/2015  . Chronic airway obstruction ([place]) 03/11/2015  . Acid reflux 05/07/2014  . Cough 09/06/2012  . Shortness of breath 09/04/2012  . OSA (obstructive sleep apnea) 09/04/2012    Past Surgical History:  Procedure Laterality Date  . COLONOSCOPY    . DENTAL SURGERY  12/2014  . INGUINAL HERNIA REPAIR    . NOSE SURGERY     submucous surgery  . POLYPECTOMY    . SINUS SURGERY WITH INSTATRAK    . SUBCLAVIAN BYPASS GRAFT  2009  . TESTICLE REMOVAL      His family history includes Allergies in his father; Arthritis in his sister; Asthma in his sister; Breast cancer in his sister; CAD in his brother and father; Colon cancer (age of onset: 74) in his sister; Congestive Heart Failure in his mother; Emphysema in his mother; Heart attack in his father, sister, and sister; Lung cancer in his brother; Stroke in his brother. There is no history of Esophageal cancer, Stomach cancer, or Rectal cancer.   Current Outpatient Medications:  .  albuterol (PROAIR HFA) 108 (90 Base) MCG/ACT inhaler, Inhale 2 puffs into the lungs every 4 (four) hours as needed., Disp: 18 g, Rfl: 12 .  aspirin 81 MG tablet, Take 81 mg by mouth daily., Disp: , Rfl:  .  atorvastatin (LIPITOR) 20 MG tablet, TAKE 1 TABLET BY MOUTH EVERY DAY, Disp: 90 tablet, Rfl: 3 .  fluticasone (FLONASE) 50 MCG/ACT nasal spray, Place 2 sprays into both nostrils daily. (Patient taking differently: Place 2 sprays into both nostrils daily. As needed), Disp: 16 g, Rfl: 6 .  folic acid (FOLVITE) 222 MCG tablet, Take 400 mcg by mouth daily., Disp: , Rfl:  .  ibuprofen (ADVIL,MOTRIN) 200 MG tablet, Take 200 mg by  mouth every 4 (four) hours as needed. , Disp: , Rfl:  .  levalbuterol (XOPENEX HFA) 45 MCG/ACT inhaler, Inhale 1-2 puffs into the lungs every 6 (six) hours as needed for wheezing., Disp: , Rfl:  .  levothyroxine (SYNTHROID, LEVOTHROID) 50 MCG tablet, TAKE 1 TABLET BY MOUTH EVERY DAY, Disp: 90 tablet, Rfl: 1 .  magnesium oxide (MAG-OX) 400 MG tablet, Take 400 mg by mouth 2 (two) times daily., Disp: , Rfl:  .  meloxicam (MOBIC) 15 MG tablet, Take 15 mg by mouth daily., Disp: , Rfl:  .  meloxicam (MOBIC) 7.5 MG tablet, Take 7.5 mg by mouth 2 (two) times daily., Disp: , Rfl:  .  methocarbamol (ROBAXIN) 500 MG tablet, Take 500 mg by mouth 2 (two) times daily., Disp: ,  Rfl:  .  Multiple Vitamins-Minerals (CENTRUM SILVER ADULT 50+ PO), Take 1 tablet by mouth daily., Disp: , Rfl:  .  Naproxen Sodium (ALEVE) 220 MG CAPS, as needed., Disp: , Rfl:  .  Oxymetazoline HCl (AFRIN NASAL SPRAY NA), Place into the nose as needed.  , Disp: , Rfl:  .  Phenylephrine-APAP-Guaifenesin (MUCINEX SINUS-MAX CONGESTION) 5-325-200 MG TABS, Per bottle directions as needed, Disp: , Rfl:  .  Potassium 75 MG TABS, Take 99 mg by mouth daily. , Disp: , Rfl:  .  SYMBICORT 160-4.5 MCG/ACT inhaler, INHALE 1-2 PUFFS TWICE DAILY, Disp: 10.2 Inhaler, Rfl: 12 .  hydrocortisone (ANUSOL-HC) 25 MG suppository, Place 1 suppository (25 mg total) rectally 2 (two) times daily. (Patient not taking: Reported on 06/13/2019), Disp: 12 suppository, Rfl: 0 .  mometasone-formoterol (DULERA) 100-5 MCG/ACT AERO, Inhale 2 puffs into the lungs daily. (Patient not taking: Reported on 06/13/2019), Disp: 1 Inhaler, Rfl: 12 .  sildenafil (REVATIO) 20 MG tablet, Take 2 to 4 tablets daily as needed (Patient not taking: Reported on 06/13/2019), Disp: 50 tablet, Rfl: 12  Patient Care Team: Jerrol Banana., MD as PCP - General (Unknown Physician Specialty) [place], [place] as Consulting Physician (Optometry)    Objective:    Vitals: There were no  vitals taken for this visit.  Physical Exam Vitals signs reviewed.  Constitutional:      Appearance: He is well-developed.  HENT:     Head: Normocephalic and atraumatic.     Right Ear: External ear normal.     Left Ear: External ear normal.     Nose: Nose normal.  Eyes:     General: No scleral icterus.    Conjunctiva/sclera: Conjunctivae normal.  Neck:     Thyroid: No thyromegaly.  Cardiovascular:     Rate and Rhythm: Normal rate and regular rhythm.     Heart sounds: Normal heart sounds.  Pulmonary:     Effort: Pulmonary effort is normal.  Abdominal:     Palpations: Abdomen is soft.  Musculoskeletal:     Right lower leg: No edema.     Left lower leg: No edema.  Skin:    General: Skin is warm and dry.  Neurological:     Mental Status: He is alert and oriented to person, place, and time.  Psychiatric:        Mood and Affect: Mood normal.        Behavior: Behavior normal.        Thought Content: Thought content normal.        Judgment: Judgment normal.     Activities of Daily Living No flowsheet data found.  Fall Risk Assessment Fall Risk  06/06/2018 05/24/2017 02/17/2016  Falls in the past year? No No No     Depression Screen PHQ 2/9 Scores 06/06/2018 06/06/2018 05/24/2017 05/24/2017  PHQ - 2 Score 1 1 0 0  PHQ- 9 Score 4 - 5 -    6CIT Screen 05/24/2017  What Year? 0 points  What month? 0 points  What time? 3 points  Count back from 20 0 points  Months in reverse 0 points  Repeat phrase 0 points  Total Score 3      Assessment & Plan:     Annual Wellness Visit  Reviewed patient's Family Medical History Reviewed and updated list of patient's medical providers Assessment of cognitive impairment was done Assessed patient's functional ability Established a written schedule for health screening [place] Completed and Reviewed  Exercise Activities and Dietary recommendations Goals    . DIET - INCREASE WATER INTAKE     Recommend  increasing water intake to 4-6 glasses a day.     . Increase water intake     Recommend increasing water intake to 4-6 glasses a day.        Immunization History  Administered Date(s) Administered  . Influenza Whole 07/31/2012  . Influenza, High Dose Seasonal PF 10/10/2016, 09/26/2017  . Pneumococcal Conjugate-13 08/04/2014  . Pneumococcal Polysaccharide-23 05/24/2017  . Td 07/30/2004  . Tdap 08/04/2014, 11/24/2016    Health Maintenance  Topic Date Due  . COLONOSCOPY  06/01/2019  . INFLUENZA VACCINE  06/01/2019  . TETANUS/TDAP  11/24/2026  . Hepatitis C Screening  Completed  . PNA vac Low Risk Adult  Completed     Discussed health benefits of physical activity, and encouraged him to engage in regular exercise appropriate for his age and condition.   1. Annual physical exam Shingrix started. Had Hep C earlier.GI--Dr Henrene Pastor 2. Essential (primary) hypertension  - CBC with Differential/Platelet - Comprehensive metabolic panel  3. Hyperlipidemia, unspecified hyperlipidemia type  - Comprehensive metabolic panel - Lipid Panel With LDL/HDL Ratio  4. Adult hypothyroidism  - TSH 5.Mild Asthma Stop Dulera as he has not used albuterol in recent times. 6.Prostate cancer Per Dr Erlene Quan. ------------------------------------------------------------------------------------------------------------    Wilhemena Durie, MD  [facility]

## 2019-06-13 NOTE — Patient Instructions (Signed)
Stop [employer]

## 2019-06-14 DIAGNOSIS — E039 Hypothyroidism, unspecified: Secondary | ICD-10-CM | POA: Diagnosis not present

## 2019-06-14 DIAGNOSIS — E785 Hyperlipidemia, unspecified: Secondary | ICD-10-CM | POA: Diagnosis not present

## 2019-06-14 DIAGNOSIS — I10 Essential (primary) hypertension: Secondary | ICD-10-CM | POA: Diagnosis not present

## 2019-06-15 LAB — LIPID PANEL WITH LDL/HDL RATIO
Cholesterol, Total: 131 mg/dL (ref 100–199)
HDL: 40 mg/dL (ref >39)
LDL Calculated: 64 mg/dL (ref 0–99)
LDl/HDL Ratio: 1.6 ratio (ref 0.0–3.6)
Triglycerides: 133 mg/dL (ref 0–149)
VLDL Cholesterol Cal: 27 mg/dL (ref 5–40)

## 2019-06-15 LAB — CBC WITH DIFFERENTIAL/PLATELET
Basophils Absolute: 0 10*3/uL (ref 0.0–0.2)
Basos: 1 %
EOS (ABSOLUTE): 0.3 10*3/uL (ref 0.0–0.4)
Eos: 5 %
Hematocrit: 43.2 % (ref 37.5–51.0)
Hemoglobin: 14.6 g/dL (ref 13.0–17.7)
Immature Grans (Abs): 0 10*3/uL (ref 0.0–0.1)
Immature Granulocytes: 0 %
Lymphocytes Absolute: 1.2 10*3/uL (ref 0.7–3.1)
Lymphs: 19 %
MCH: 32 pg (ref 26.6–33.0)
MCHC: 33.8 g/dL (ref 31.5–35.7)
MCV: 95 fL (ref 79–97)
Monocytes Absolute: 0.7 10*3/uL (ref 0.1–0.9)
Monocytes: 11 %
Neutrophils Absolute: 3.8 10*3/uL (ref 1.4–7.0)
Neutrophils: 64 %
Platelets: 224 10*3/uL (ref 150–450)
RBC: 4.56 x10E6/uL (ref 4.14–5.80)
RDW: 12.6 % (ref 11.6–15.4)
WBC: 6 10*3/uL (ref 3.4–10.8)

## 2019-06-15 LAB — COMPREHENSIVE METABOLIC PANEL
ALT: 26 IU/L (ref 0–44)
AST: 22 IU/L (ref 0–40)
Albumin/Globulin Ratio: 1.9 (ref 1.2–2.2)
Albumin: 4.2 g/dL (ref 3.7–4.7)
Alkaline Phosphatase: 55 IU/L (ref 39–117)
BUN/Creatinine Ratio: 22 (ref 10–24)
BUN: 19 mg/dL (ref 8–27)
Bilirubin Total: 0.7 mg/dL (ref 0.0–1.2)
CO2: 24 mmol/L (ref 20–29)
Calcium: 8.8 mg/dL (ref 8.6–10.2)
Chloride: 103 mmol/L (ref 96–106)
Creatinine, Ser: 0.87 mg/dL (ref 0.76–1.27)
GFR calc Af Amer: 99 mL/min/{1.73_m2} (ref >59)
GFR calc non Af Amer: 86 mL/min/{1.73_m2} (ref >59)
Globulin, Total: 2.2 g/dL (ref 1.5–4.5)
Glucose: 98 mg/dL (ref 65–99)
Potassium: 4.3 mmol/L (ref 3.5–5.2)
Sodium: 141 mmol/L (ref 134–144)
Total Protein: 6.4 g/dL (ref 6.0–8.5)

## 2019-06-15 LAB — TSH: TSH: 3.08 u[IU]/mL (ref 0.450–4.500)

## 2019-07-02 ENCOUNTER — Other Ambulatory Visit: Payer: Self-pay

## 2019-07-02 ENCOUNTER — Ambulatory Visit (AMBULATORY_SURGERY_CENTER): Payer: Self-pay | Admitting: *Deleted

## 2019-07-02 VITALS — Temp 96.9°F | Ht 73.0 in | Wt 259.0 lb

## 2019-07-02 DIAGNOSIS — Z86010 Personal history of colonic polyps: Secondary | ICD-10-CM

## 2019-07-02 MED ORDER — SUPREP BOWEL PREP KIT 17.5-3.13-1.6 GM/177ML PO SOLN: 1.0000 | Freq: Once | ORAL | 0 refills | Status: AC

## 2019-07-02 NOTE — Progress Notes (Signed)
No egg or soy allergy known to patient  No issues with past sedation with any surgeries  or procedures, no intubation problems  No diet pills per patient No home 02 use per patient  No blood thinners per patient  Pt denies issues with constipation  No A fib or A flutter  EMMI video declined  Wife inPV today with pt- wearing a mask and her temp is 97.1- no COVID s/s

## 2019-07-04 ENCOUNTER — Encounter: Payer: Self-pay | Admitting: Internal Medicine

## 2019-07-10 ENCOUNTER — Other Ambulatory Visit: Payer: Self-pay | Admitting: Family Medicine

## 2019-07-16 ENCOUNTER — Telehealth: Payer: Self-pay

## 2019-07-16 NOTE — Telephone Encounter (Signed)
Covid-19 screening questions   Do you now or have you had a fever in the last 14 days? NO   Do you have any respiratory symptoms of shortness of breath or cough now or in the last 14 days? NO  Do you have any family members or close contacts with diagnosed or suspected Covid-19 in the past 14 days? NO  Have you been tested for Covid-19 and found to be positive? NO        

## 2019-07-17 ENCOUNTER — Ambulatory Visit (AMBULATORY_SURGERY_CENTER): Payer: Medicare Other | Admitting: Internal Medicine

## 2019-07-17 ENCOUNTER — Other Ambulatory Visit: Payer: Self-pay

## 2019-07-17 ENCOUNTER — Encounter: Payer: Self-pay | Admitting: Internal Medicine

## 2019-07-17 ENCOUNTER — Other Ambulatory Visit: Payer: Self-pay | Admitting: Internal Medicine

## 2019-07-17 VITALS — BP 128/78 | HR 66 | Temp 98.5°F | Resp 25 | Ht 73.0 in | Wt 259.0 lb

## 2019-07-17 DIAGNOSIS — D122 Benign neoplasm of ascending colon: Secondary | ICD-10-CM

## 2019-07-17 DIAGNOSIS — Z86010 Personal history of colonic polyps: Secondary | ICD-10-CM

## 2019-07-17 MED ORDER — SODIUM CHLORIDE 0.9 % IV SOLN: 500.0000 mL | Freq: Once | INTRAVENOUS | Status: DC

## 2019-07-17 NOTE — Progress Notes (Signed)
Called to room to assist during endoscopic procedure.  Patient ID and intended procedure confirmed with present staff. Received instructions for my participation in the procedure from the performing physician.  

## 2019-07-17 NOTE — Patient Instructions (Addendum)
YOU HAD AN ENDOSCOPIC PROCEDURE TODAY AT [facility]:   Refer to the procedure report that was given to you for any specific questions about what was found during the examination.  If the procedure report does not answer your questions, please call your gastroenterologist to clarify.  If you requested that your care partner not be given the details of your procedure findings, then the procedure report has been included in a sealed envelope for you to review at your convenience later.  **Handouts given on Diverticulosis, polyps and hemorrhoids**  YOU SHOULD EXPECT: Some feelings of bloating in the abdomen. Passage of more gas than usual.  Walking can help get rid of the air that was put into your GI tract during the procedure and reduce the bloating. If you had a lower endoscopy (such as a colonoscopy or flexible sigmoidoscopy) you may notice spotting of blood in your stool or on the toilet paper. If you underwent a bowel prep for your procedure, you may not have a normal bowel movement for a few days.  Please Note:  You might notice some irritation and congestion in your nose or some drainage.  This is from the oxygen used during your procedure.  There is no need for concern and it should clear up in a day or so.  SYMPTOMS TO REPORT IMMEDIATELY:   Following lower endoscopy (colonoscopy or flexible sigmoidoscopy):  Excessive amounts of blood in the stool  Significant tenderness or worsening of abdominal pains  Swelling of the abdomen that is new, acute  Fever of 100F or higher   For urgent or emergent issues, a gastroenterologist can be reached at any hour by calling [phone].   DIET:  We do recommend a small meal at first, but then you may proceed to your regular diet.  Drink plenty of fluids but you should avoid alcoholic beverages for 24 hours.  ACTIVITY:  You should plan to take it easy for the rest of today and you should NOT DRIVE or use heavy machinery until  tomorrow (because of the sedation medicines used during the test).    FOLLOW UP: Our staff will call the number listed on your records 48-72 hours following your procedure to check on you and address any questions or concerns that you may have regarding the information given to you following your procedure. If we do not reach you, we will leave a message.  We will attempt to reach you two times.  During this call, we will ask if you have developed any symptoms of COVID 19. If you develop any symptoms (ie: fever, flu-like symptoms, shortness of breath, cough etc.) before then, please call [phone].  If you test positive for Covid 19 in the 2 weeks post procedure, please call and report this information to [place].    If any biopsies were taken you will be contacted by phone or by letter within the next 1-3 weeks.  Please call us at [phone] if you have not heard about the biopsies in 3 weeks.    SIGNATURES/CONFIDENTIALITY: You and/or your care partner have signed paperwork which will be entered into your electronic medical record.  These signatures attest to the fact that that the information above on your After Visit Summary has been reviewed and is understood.  Full responsibility of the confidentiality of this discharge information lies with you and/or your care-partner.YOU HAD AN ENDOSCOPIC PROCEDURE TODAY AT THE

## 2019-07-17 NOTE — Op Note (Signed)
[place] Patient Name: Matthew [name] Procedure Date: 07/17/2019 7:58 AM MRN: [id number] Endoscopist: Docia Chuck. Henrene Pastor , MD Age: 73 Referring MD:  Date of Birth: [birth year] Gender: Male Account #: [id number] Procedure:                Colonoscopy with cold snare polypectomy x 3 Indications:              High risk colon cancer surveillance: Personal                            history of multiple (3 or more) adenomas. Multiple                            previous examinations. Also had a malignant colon                            polyp. Prior exams 2002, 2003, 2006, 2008, 2010,                            2012, 2014, 2016, 2018 Medicines:                Monitored Anesthesia Care Procedure:                Pre-Anesthesia Assessment:                           - Prior to the procedure, a History and Physical                            was performed, and patient medications and                            allergies were reviewed. The patient's tolerance of                            previous anesthesia was also reviewed. The risks                            and benefits of the procedure and the sedation                            options and risks were discussed with the patient.                            All questions were answered, and informed consent                            was obtained. Prior Anticoagulants: The patient has                            taken no previous anticoagulant or antiplatelet                            agents. ASA Grade Assessment: II - A patient with  mild systemic disease. After reviewing the risks                            and benefits, the patient was deemed in                            satisfactory condition to undergo the procedure.                           After obtaining informed consent, the colonoscope                            was passed under direct vision. Throughout the                            procedure, the  patient's blood pressure, pulse, and                            oxygen saturations were monitored continuously. The                            Colonoscope was introduced through the anus and                            advanced to the the cecum, identified by                            appendiceal orifice and ileocecal valve. The                            ileocecal valve, appendiceal orifice, and rectum                            were photographed. The quality of the bowel                            preparation was excellent. The colonoscopy was                            performed without difficulty. The patient tolerated                            the procedure well. The bowel preparation used was                            SUPREP via split dose instruction. Scope In: 8:33:21 AM Scope Out: 8:45:06 AM Scope Withdrawal Time: 0 hours 10 minutes 0 seconds  Total Procedure Duration: 0 hours 11 minutes 45 seconds  Findings:                 Three polyps were found in the ascending colon. The                            polyps were 1 to 3 mm in size. These polyps  were                            removed with a cold snare. Resection and retrieval                            were complete.                           Multiple diverticula were found in the left colon.                           Internal hemorrhoids were found during                            retroflexion. The hemorrhoids were small.                           The exam was otherwise without abnormality on                            direct and retroflexion views. Complications:            No immediate complications. Estimated blood loss:                            None. Estimated Blood Loss:     Estimated blood loss: none. Impression:               - Three 1 to 3 mm polyps in the ascending colon,                            removed with a cold snare. Resected and retrieved.                           - Diverticulosis in the left colon.                            - Internal hemorrhoids.                           - The examination was otherwise normal on direct                            and retroflexion views. Recommendation:           - Repeat colonoscopy in 3 years for surveillance.                           - Patient has a contact number available for                            emergencies. The signs and symptoms of potential                            delayed complications were discussed with the  patient. Return to normal activities tomorrow.                            Written discharge instructions were provided to the                            patient.                           - Resume previous diet.                           - Continue present medications.                           - Await pathology results. Docia Chuck. Henrene Pastor, MD 07/17/2019 8:54:14 AM This report has been signed electronically.

## 2019-07-17 NOTE — Progress Notes (Signed)
Pt's states no medical or surgical changes since previsit or office visit.  KA temps [place] vitals

## 2019-07-17 NOTE — Progress Notes (Signed)
To PACU, VSS. Report to [email or web address] 

## 2019-07-19 ENCOUNTER — Telehealth: Payer: Self-pay | Admitting: *Deleted

## 2019-07-19 NOTE — Telephone Encounter (Signed)
  Follow up Call-  Call back number 07/17/2019 05/31/2017  Post procedure Call Back phone  # [phone]  Permission to leave phone message Yes Yes  Some recent data might be hidden     Patient questions:  Do you have a fever, pain , or abdominal swelling? No. Pain Score  0 *  Have you tolerated food without any problems? Yes.    Have you been able to return to your normal activities? Yes.    Do you have any questions about your discharge instructions: Diet   No. Medications  No. Follow up visit  No.  Do you have questions or concerns about your Care? No.  Actions: * If pain score is 4 or above: No action needed, pain <4.  1. Have you developed a fever since your procedure? no  2.   Have you had an respiratory symptoms (SOB or cough) since your procedure? no  3.   Have you tested positive for COVID 19 since your procedure no  4.   Have you had any family members/close contacts diagnosed with the COVID 19 since your procedure?  no   If yes to any of these questions please route to Joylene John, RN and Alphonsa Gin, Therapist, sports.

## 2019-07-19 NOTE — Telephone Encounter (Signed)
  Follow up Call-  Call back number 07/17/2019 05/31/2017  Post procedure Call Back phone  # [phone]  Permission to leave phone message Yes Yes  Some recent data might be hidden    [facility]

## 2019-07-23 ENCOUNTER — Encounter: Payer: Self-pay | Admitting: Internal Medicine

## 2019-08-05 ENCOUNTER — Other Ambulatory Visit: Payer: Self-pay

## 2019-08-05 ENCOUNTER — Telehealth: Payer: Self-pay | Admitting: Family Medicine

## 2019-08-05 DIAGNOSIS — Z20822 Contact with and (suspected) exposure to covid-19: Secondary | ICD-10-CM

## 2019-08-05 DIAGNOSIS — R059 Cough, unspecified: Secondary | ICD-10-CM

## 2019-08-05 DIAGNOSIS — Z20828 Contact with and (suspected) exposure to other viral communicable diseases: Secondary | ICD-10-CM

## 2019-08-05 DIAGNOSIS — R05 Cough: Secondary | ICD-10-CM

## 2019-08-05 NOTE — Telephone Encounter (Signed)
Pt has dry cough and feels like he needs to be tested for COVID please call [phone] when order is in.

## 2019-08-05 NOTE — Telephone Encounter (Signed)
covid test please.

## 2019-08-05 NOTE — Telephone Encounter (Signed)
Test ordered. Patient wife advised.

## 2019-08-05 NOTE — Telephone Encounter (Signed)
Please review. Thanks!  

## 2019-08-07 LAB — NOVEL CORONAVIRUS, NAA: SARS-CoV-2, NAA: NOT DETECTED

## 2019-08-19 ENCOUNTER — Ambulatory Visit: Payer: Medicare Other | Admitting: Family Medicine

## 2019-08-19 ENCOUNTER — Telehealth: Payer: Self-pay | Admitting: Family Medicine

## 2019-08-20 ENCOUNTER — Telehealth: Payer: Self-pay | Admitting: Family Medicine

## 2019-08-20 ENCOUNTER — Other Ambulatory Visit: Payer: Self-pay | Admitting: *Deleted

## 2019-08-20 ENCOUNTER — Telehealth: Payer: Self-pay | Admitting: *Deleted

## 2019-08-20 DIAGNOSIS — R05 Cough: Secondary | ICD-10-CM

## 2019-08-20 DIAGNOSIS — Z20822 Contact with and (suspected) exposure to covid-19: Secondary | ICD-10-CM

## 2019-08-20 DIAGNOSIS — R062 Wheezing: Secondary | ICD-10-CM

## 2019-08-20 DIAGNOSIS — R059 Cough, unspecified: Secondary | ICD-10-CM

## 2019-08-20 MED ORDER — DOXYCYCLINE HYCLATE 100 MG PO TABS: 100.0000 mg | ORAL_TABLET | Freq: Two times a day (BID) | ORAL | 0 refills | Status: DC

## 2019-08-20 MED ORDER — ALBUTEROL SULFATE HFA 108 (90 BASE) MCG/ACT IN AERS: 2.0000 | INHALATION_SPRAY | Freq: Four times a day (QID) | RESPIRATORY_TRACT | 11 refills | Status: DC | PRN

## 2019-08-20 NOTE — Telephone Encounter (Signed)
Patient called office with complaits of shortness of breath, non-productive cough, and wheezing. Patient was on the schedule yesterday virtual visit but canceled appointment. Patient wanted an ov with Dr. Rosanna Randy or advise on what he should do. Per Dr. Rosanna Randy go for covid test. Will send in doxycycline and albuterol inhaler 4 times daily prn.

## 2019-08-20 NOTE — Telephone Encounter (Signed)
Patient advised as below. Medication was sent into the pharmacy. Patient was advised to repeat covid test.

## 2019-08-21 LAB — NOVEL CORONAVIRUS, NAA: SARS-CoV-2, NAA: NOT DETECTED

## 2019-08-22 ENCOUNTER — Telehealth: Payer: Self-pay

## 2019-08-22 DIAGNOSIS — R05 Cough: Secondary | ICD-10-CM

## 2019-08-22 DIAGNOSIS — R062 Wheezing: Secondary | ICD-10-CM

## 2019-08-22 DIAGNOSIS — R059 Cough, unspecified: Secondary | ICD-10-CM

## 2019-08-22 MED ORDER — PREDNISONE 10 MG (21) PO TBPK: ORAL_TABLET | ORAL | 0 refills | Status: DC

## 2019-08-22 NOTE — Telephone Encounter (Signed)
Patient would like to try the prednisone first. Medication was sent into the pharmacy.

## 2019-08-22 NOTE — Telephone Encounter (Signed)
Advised patient. He reports that he is still having coughing spells, wheezing, and feeling short of breath. He is taking the doxy and albuterol inhaler as prescribed. He denies fever. He reports that his cough is productive, and he has noticed small amounts of blood.

## 2019-08-22 NOTE — Telephone Encounter (Signed)
-----   Message from Jerrol Banana., MD sent at 08/22/2019  7:53 AM EDT ----- No covid detected.

## 2019-08-22 NOTE — Telephone Encounter (Signed)
OV or we can add prednisone 10mg  --6 day taper.

## 2019-09-23 DIAGNOSIS — R05 Cough: Secondary | ICD-10-CM | POA: Diagnosis not present

## 2019-09-23 DIAGNOSIS — R918 Other nonspecific abnormal finding of lung field: Secondary | ICD-10-CM | POA: Diagnosis not present

## 2019-09-24 DIAGNOSIS — M7062 Trochanteric bursitis, left hip: Secondary | ICD-10-CM | POA: Diagnosis not present

## 2019-10-11 ENCOUNTER — Other Ambulatory Visit: Payer: Self-pay

## 2019-10-11 DIAGNOSIS — Z20822 Contact with and (suspected) exposure to covid-19: Secondary | ICD-10-CM

## 2019-10-12 LAB — NOVEL CORONAVIRUS, NAA: SARS-CoV-2, NAA: NOT DETECTED

## 2019-10-22 ENCOUNTER — Other Ambulatory Visit: Payer: Self-pay | Admitting: Family Medicine

## 2019-11-06 DIAGNOSIS — G4733 Obstructive sleep apnea (adult) (pediatric): Secondary | ICD-10-CM | POA: Diagnosis not present

## 2019-12-05 DIAGNOSIS — M7062 Trochanteric bursitis, left hip: Secondary | ICD-10-CM | POA: Diagnosis not present

## 2019-12-07 DIAGNOSIS — G4733 Obstructive sleep apnea (adult) (pediatric): Secondary | ICD-10-CM | POA: Diagnosis not present

## 2019-12-18 ENCOUNTER — Other Ambulatory Visit: Payer: Self-pay

## 2019-12-18 ENCOUNTER — Ambulatory Visit
Admission: RE | Admit: 2019-12-18 | Discharge: 2019-12-18 | Disposition: A | Discharge status: Home / Self Care | Payer: Medicare Other | Source: Ambulatory Visit | Attending: Urology | Admitting: Urology

## 2019-12-18 DIAGNOSIS — Z8547 Personal history of malignant neoplasm of testis: Secondary | ICD-10-CM | POA: Insufficient documentation

## 2019-12-18 DIAGNOSIS — M7062 Trochanteric bursitis, left hip: Secondary | ICD-10-CM | POA: Diagnosis not present

## 2019-12-18 DIAGNOSIS — N200 Calculus of kidney: Secondary | ICD-10-CM | POA: Diagnosis not present

## 2019-12-18 DIAGNOSIS — J449 Chronic obstructive pulmonary disease, unspecified: Secondary | ICD-10-CM | POA: Diagnosis not present

## 2019-12-18 LAB — POCT I-STAT CREATININE: Creatinine, Ser: 1 mg/dL (ref 0.61–1.24)

## 2019-12-18 MED ORDER — IOHEXOL 300 MG/ML  SOLN
100.0000 mL | Freq: Once | INTRAMUSCULAR | Status: AC | PRN
  Administered 2019-12-18: 100 mL via INTRAVENOUS

## 2019-12-24 DIAGNOSIS — J4520 Mild intermittent asthma, uncomplicated: Secondary | ICD-10-CM | POA: Diagnosis not present

## 2019-12-24 DIAGNOSIS — G4733 Obstructive sleep apnea (adult) (pediatric): Secondary | ICD-10-CM | POA: Diagnosis not present

## 2019-12-25 ENCOUNTER — Other Ambulatory Visit: Payer: Self-pay

## 2019-12-25 ENCOUNTER — Encounter: Payer: Self-pay | Admitting: Urology

## 2019-12-25 ENCOUNTER — Ambulatory Visit: Payer: Medicare Other | Admitting: Urology

## 2019-12-25 VITALS — BP 141/84 | HR 82 | Ht 73.0 in | Wt 262.0 lb

## 2019-12-25 DIAGNOSIS — N200 Calculus of kidney: Secondary | ICD-10-CM | POA: Diagnosis not present

## 2019-12-25 DIAGNOSIS — Z8547 Personal history of malignant neoplasm of testis: Secondary | ICD-10-CM | POA: Diagnosis not present

## 2019-12-25 LAB — URINALYSIS, COMPLETE
Bilirubin, UA: NEGATIVE
Glucose, UA: NEGATIVE
Ketones, UA: NEGATIVE
Leukocytes,UA: NEGATIVE
Nitrite, UA: NEGATIVE
Protein,UA: NEGATIVE
RBC, UA: NEGATIVE
Specific Gravity, UA: >1.03 — ABNORMAL HIGH (ref 1.005–1.030)
Urobilinogen, Ur: 0.2 mg/dL (ref 0.2–1.0)
pH, UA: 5.5 (ref 5.0–7.5)

## 2019-12-25 LAB — MICROSCOPIC EXAMINATION: RBC, Urine: NONE SEEN /hpf (ref 0–2)

## 2019-12-25 NOTE — Progress Notes (Signed)
12/25/19 11:07 AM   Matthew [name] 1945-11-25 [id number]  Referring provider: Jerrol Banana., MD [address] [place],  [place] [address]  Chief Complaint  Patient presents with  . 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 He returns today to review his most recent imaging and lab data for annual follow up.  Pathology for left inguinal radical orchiectomy from 9/2/15shows mixed germ cell tumor with 40% embryonal, 40% teratoma, 5% yolk sac, and 5% seminoma. Tumor confined to testicle without LVI or local invasion of tunica vaginalis. Tumor markers negative.  He elected surveillance without adjuvant treatment.  Most recent imaging performed on 12/18/19 showed no evidence of mass or lymphadenopathy s/p left orchiectomy and is positive for nonobstructive right nephrolithiasis. Chest Xray showed NED.   Today, he reports of weight gain. He is retired. He cut back on his eating but didn't seem to help with his weight gain. He reports of no urinary symptoms.   PMH: Past Medical History:  Diagnosis Date  . Allergic rhinitis, cause unspecified   . Allergy   . Arthritis   . Asthma   . Benign neoplasm of colon   . Clotting disorder ([place])    arterial bypass   . Collapsed lung   . COPD (chronic obstructive pulmonary disease) ([place])   . ED (erectile dysfunction)   . GERD (gastroesophageal reflux disease)   . Hyperlipidemia   . Hypertension   . Internal hemorrhoids without mention of complication   . Left testicular cancer ([place]) 07/2014  . OSA (obstructive sleep apnea)    wears CPAP  . Sleep apnea    occasional CPAP  . Thyroid disease   . Unspecified asthma(493.90)   . Unspecified sinusitis (chronic)     Surgical History: Past Surgical History:  Procedure Laterality Date  . COLONOSCOPY    . DENTAL SURGERY  12/2014  . INGUINAL HERNIA REPAIR    . NOSE SURGERY     submucous  surgery  . POLYPECTOMY    . SINUS SURGERY WITH INSTATRAK    . SUBCLAVIAN BYPASS GRAFT  2009  . TESTICLE REMOVAL      Home Medications:  Allergies as of 12/25/2019   No Known Allergies     Medication List       Accurate as of December 25, 2019 11:07 AM. If you have any questions, ask your nurse or doctor.        STOP taking these medications   doxycycline 100 MG tablet Commonly known as: VIBRA-TABS Stopped by: Hollice Espy, MD     TAKE these medications   AFRIN NASAL SPRAY NA Place into the nose as needed.   albuterol 108 (90 Base) MCG/ACT inhaler Commonly known as: ProAir HFA Inhale 2 puffs into the lungs every 4 (four) hours as needed.   albuterol 108 (90 Base) MCG/ACT inhaler Commonly known as: VENTOLIN HFA Inhale 2 puffs into the lungs every 6 (six) hours as needed for wheezing or shortness of breath.   Aleve 220 MG Caps Generic drug: Naproxen Sodium as needed.   aspirin 81 MG tablet Take 81 mg by mouth daily.   atorvastatin 20 MG tablet Commonly known as: LIPITOR TAKE 1 TABLET BY MOUTH EVERY DAY   CENTRUM SILVER ADULT 50+ PO Take 1 tablet by mouth daily.   fluticasone 50 MCG/ACT nasal spray Commonly known as: FLONASE Place 2 sprays into both nostrils daily. What changed:  additional instructions   folic acid 956 MCG tablet Commonly known as: FOLVITE Take 400 mcg by mouth daily.   hydrocortisone 25 MG suppository Commonly known as: ANUSOL-HC Place 1 suppository (25 mg total) rectally 2 (two) times daily.   ibuprofen 200 MG tablet Commonly known as: ADVIL Take 200 mg by mouth every 4 (four) hours as needed.   levalbuterol 45 MCG/ACT inhaler Commonly known as: XOPENEX HFA Inhale 1-2 puffs into the lungs every 6 (six) hours as needed for wheezing.   levothyroxine 50 MCG tablet Commonly known as: SYNTHROID TAKE 1 TABLET BY MOUTH EVERY DAY   magnesium oxide 400 MG tablet Commonly known as: MAG-OX Take 400 mg by mouth 2 (two) times daily.    meloxicam 15 MG tablet Commonly known as: MOBIC Take 15 mg by mouth daily.   meloxicam 7.5 MG tablet Commonly known as: MOBIC Take 7.5 mg by mouth 2 (two) times daily.   methocarbamol 500 MG tablet Commonly known as: ROBAXIN Take 500 mg by mouth 2 (two) times daily.   mometasone-formoterol 100-5 MCG/ACT Aero Commonly known as: Dulera Inhale 2 puffs into the lungs daily. What changed: additional instructions   Mucinex Sinus-Max Congestion 5-325-200 MG Tabs Generic drug: Phenylephrine-APAP-guaiFENesin Per bottle directions as needed   Potassium 75 MG Tabs Take 99 mg by mouth daily.   predniSONE 10 MG tablet Commonly known as: DELTASONE Take 10 mg by mouth daily. What changed: Another medication with the same name was removed. Continue taking this medication, and follow the directions you see here. Changed by: Hollice Espy, MD   sildenafil 20 MG tablet Commonly known as: REVATIO Take 2 to 4 tablets daily as needed   Symbicort 160-4.5 MCG/ACT inhaler Generic drug: budesonide-formoterol INHALE 1-2 PUFFS TWICE DAILY       Allergies: No Known Allergies  Family History: Family History  Problem Relation Age of Onset  . Colon cancer Sister 72  . Breast cancer Sister   . Heart attack Sister   . Heart attack Sister        deceased from MI  . Allergies Father   . Heart attack Father   . CAD Father        deceased 52 from fall and possible subdural hematoma  . Congestive Heart Failure Mother   . Emphysema Mother        deceased 23 with emphysema, tobacco abuse  . Asthma Sister   . CAD Brother   . Stroke Brother   . Lung cancer Brother   . Arthritis Sister   . Esophageal cancer Neg Hx   . Stomach cancer Neg Hx   . Rectal cancer Neg Hx   . Colon polyps Neg Hx     Social History:  reports that he has never smoked. He has never used smokeless tobacco. He reports current alcohol use of about 7.0 - 10.0 standard drinks of alcohol per week. He reports that he does  not use drugs.   Physical Exam: BP (!) 141/84   Pulse 82   Ht '6\' 1"'$  (1.854 m)   Wt 262 lb (118.8 kg)   BMI 34.57 kg/m   Constitutional:  Alert and oriented, No acute distress. HEENT: [place] AT, moist mucus membranes.  Trachea midline, no masses. Cardiovascular: No clubbing, cyanosis, or edema. Respiratory: Normal respiratory effort, no increased work of breathing. GI: Abd was obese and soft  GU: No CVA tenderness, right testicle normal no masses or lesions, left testicle surgically absent, well healed scar in sub-inguinal area.  Skin: No rashes, bruises or suspicious lesions. Neurologic: Grossly intact, no focal deficits, moving all 4 extremities. Psychiatric: Normal mood and affect.  Pertinent Imaging: CLINICAL DATA:  Left testicular cancer, restaging  EXAM: CT ABDOMEN AND PELVIS WITH CONTRAST  TECHNIQUE: Multidetector CT imaging of the abdomen and pelvis was performed using the standard protocol following bolus administration of intravenous contrast.  CONTRAST:  180m OMNIPAQUE IOHEXOL 300 MG/ML SOLN, additional oral enteric contrast  COMPARISON:  12/13/2018  FINDINGS: Lower chest: No acute abnormality. Redemonstrated scarring of the bilateral lung bases. Coronary artery calcifications.  Hepatobiliary: No solid liver abnormality is seen. No gallstones, gallbladder wall thickening, or biliary dilatation.  Pancreas: Unremarkable. No pancreatic ductal dilatation or surrounding inflammatory changes.  Spleen: Normal in size without significant abnormality.  Adrenals/Urinary Tract: Adrenal glands are unremarkable. Tiny nonobstructive inferior pole calculus of the right kidney. No hydronephrosis. Bladder is unremarkable.  Stomach/Bowel: Stomach is within normal limits. Appendix appears normal. No evidence of bowel wall thickening, distention, or inflammatory changes. Sigmoid diverticulosis.  Vascular/Lymphatic: Aortic atherosclerosis. No enlarged abdominal or  pelvic lymph nodes.  Reproductive: No mass or other significant abnormality.  Other: Status post left orchiectomy. Small, fat containing right inguinal hernia. No abdominopelvic ascites.  Musculoskeletal: No acute or significant osseous findings.  IMPRESSION: 1. Status post left orchiectomy. No evidence of mass or lymphadenopathy in the abdomen or pelvis. 2. Nonobstructive right nephrolithiasis. 3. Sigmoid diverticulosis. 4. Coronary artery disease. Aortic Atherosclerosis (ICD10-I70.0).   Electronically Signed   By: AEddie CandleM.D.   On: 12/18/2019 10:08  CLINICAL DATA:  COPD.  Testicular carcinoma.  EXAM: CHEST  1 VIEW  COMPARISON:  12/13/2018  FINDINGS: The heart size and mediastinal contours are within normal limits. Stable elevation of right hemidiaphragm. Both lungs are clear.  IMPRESSION: No active disease.   Electronically Signed   By: JMarlaine HindM.D.   On: 12/18/2019 10:49  I personally reviewd radiologic interprettion and noted NED.   Assessment & Plan:   1. History of testicular cancer NED He met 5 year -anniversary and will not need a f/u according to NCCN guidelines Patient is agreeable this plan Continue self testicular exams  2. Right Kidney Stone  -Stable for multiple years -SJoaquim Laiis small, asymptomatic  -F/u as needed if symptoms develop   Return if symptoms worsen or fail to improve. [facility] [address] [place] [phone] I, NLucas Mallow am acting as a scribe for Dr. AHollice Espy  AHollice Espy MD

## 2019-12-25 NOTE — Addendum Note (Signed)
Addended by: Verlene Mayer A on: 12/25/2019 01:39 PM   Modules accepted: Orders

## 2020-01-04 DIAGNOSIS — G4733 Obstructive sleep apnea (adult) (pediatric): Secondary | ICD-10-CM | POA: Diagnosis not present

## 2020-02-04 DIAGNOSIS — G4733 Obstructive sleep apnea (adult) (pediatric): Secondary | ICD-10-CM | POA: Diagnosis not present

## 2020-03-05 DIAGNOSIS — G4733 Obstructive sleep apnea (adult) (pediatric): Secondary | ICD-10-CM | POA: Diagnosis not present

## 2020-03-09 DIAGNOSIS — R0989 Other specified symptoms and signs involving the circulatory and respiratory systems: Secondary | ICD-10-CM | POA: Diagnosis not present

## 2020-03-09 DIAGNOSIS — R05 Cough: Secondary | ICD-10-CM | POA: Diagnosis not present

## 2020-03-09 DIAGNOSIS — J310 Chronic rhinitis: Secondary | ICD-10-CM | POA: Diagnosis not present

## 2020-04-05 DIAGNOSIS — G4733 Obstructive sleep apnea (adult) (pediatric): Secondary | ICD-10-CM | POA: Diagnosis not present

## 2020-04-14 ENCOUNTER — Other Ambulatory Visit: Payer: Self-pay | Admitting: Family Medicine

## 2020-04-14 NOTE — Telephone Encounter (Signed)
Requested Prescriptions  Pending Prescriptions Disp Refills  . levothyroxine (SYNTHROID) 50 MCG tablet [Pharmacy Med Name: LEVOTHYROXINE 50 MCG TABLET] 90 tablet 0    Sig: TAKE 1 TABLET BY MOUTH EVERY DAY     Endocrinology:  Hypothyroid Agents Failed - 04/14/2020  1:32 AM      Failed - TSH needs to be rechecked within 3 months after an abnormal result. Refill until TSH is due.      Passed - TSH in normal range and within 360 days    TSH  Date Value Ref Range Status  06/14/2019 3.080 0.450 - 4.500 uIU/mL Final         Passed - Valid encounter within last 12 months    Recent Outpatient Visits          10 months ago Annual physical exam   [facility] Jerrol Banana., MD   1 year ago Hemorrhoids, unspecified hemorrhoid type   [facility] Jerrol Banana., MD   2 years ago Cough   [facility] Birdie Sons, MD   2 years ago BMI 33.0-33.9,adult   [facility] Jerrol Banana., MD   3 years ago COPD exacerbation [facility])   [facility] [place], Fabio Bering [place], [place]

## 2020-04-24 DIAGNOSIS — M79671 Pain in right foot: Secondary | ICD-10-CM | POA: Diagnosis not present

## 2020-04-24 DIAGNOSIS — L909 Atrophic disorder of skin, unspecified: Secondary | ICD-10-CM | POA: Diagnosis not present

## 2020-04-24 DIAGNOSIS — M7741 Metatarsalgia, right foot: Secondary | ICD-10-CM | POA: Diagnosis not present

## 2020-04-24 DIAGNOSIS — G5763 Lesion of plantar nerve, bilateral lower limbs: Secondary | ICD-10-CM | POA: Diagnosis not present

## 2020-04-24 DIAGNOSIS — M79672 Pain in left foot: Secondary | ICD-10-CM | POA: Diagnosis not present

## 2020-05-05 DIAGNOSIS — G4733 Obstructive sleep apnea (adult) (pediatric): Secondary | ICD-10-CM | POA: Diagnosis not present

## 2020-05-20 DIAGNOSIS — M79672 Pain in left foot: Secondary | ICD-10-CM | POA: Diagnosis not present

## 2020-05-20 DIAGNOSIS — M7741 Metatarsalgia, right foot: Secondary | ICD-10-CM | POA: Diagnosis not present

## 2020-05-20 DIAGNOSIS — L909 Atrophic disorder of skin, unspecified: Secondary | ICD-10-CM | POA: Diagnosis not present

## 2020-05-20 DIAGNOSIS — M216X1 Other acquired deformities of right foot: Secondary | ICD-10-CM | POA: Diagnosis not present

## 2020-05-20 DIAGNOSIS — M79671 Pain in right foot: Secondary | ICD-10-CM | POA: Diagnosis not present

## 2020-06-05 DIAGNOSIS — G4733 Obstructive sleep apnea (adult) (pediatric): Secondary | ICD-10-CM | POA: Diagnosis not present

## 2020-06-09 DIAGNOSIS — R350 Frequency of micturition: Secondary | ICD-10-CM | POA: Diagnosis not present

## 2020-06-10 ENCOUNTER — Ambulatory Visit: Payer: Medicare Other | Admitting: Urology

## 2020-06-10 DIAGNOSIS — R350 Frequency of micturition: Secondary | ICD-10-CM | POA: Diagnosis not present

## 2020-06-10 NOTE — Progress Notes (Signed)
I,April Miller,acting as a scribe for Wilhemena Durie, MD.,have documented all relevant documentation on the behalf of Wilhemena Durie, MD,as directed by  Wilhemena Durie, MD while in the presence of Wilhemena Durie, MD.   Complete physical exam   Patient: Matthew [name]   DOB: 10-23-1946   74 y.o. Male  MRN: [id number] Visit Date: 06/15/2020  Today's healthcare provider: Wilhemena Durie, MD   Chief Complaint  Patient presents with  . Annual Exam   Subjective    Matthew [name] is a 74 y.o. male who presents today for a complete physical exam.  He reports consuming a general diet. The patient does not participate in regular exercise at present. He generally feels well. He reports sleeping fairly well. He does not have additional problems to discuss today.  HPI  Patient had AWV with [facility] today at 10:00 am.  Past Medical History:  Diagnosis Date  . Allergic rhinitis, cause unspecified   . Allergy   . Arthritis   . Asthma   . Benign neoplasm of colon   . Clotting disorder ([place])    arterial bypass   . Collapsed lung   . COPD (chronic obstructive pulmonary disease) ([place])   . ED (erectile dysfunction)   . GERD (gastroesophageal reflux disease)   . Hyperlipidemia   . Hypertension   . Internal hemorrhoids without mention of complication   . Left testicular cancer ([place]) 07/2014  . OSA (obstructive sleep apnea)    wears CPAP  . Sleep apnea    occasional CPAP  . Thyroid disease   . Unspecified asthma(493.90)   . Unspecified sinusitis (chronic)    Past Surgical History:  Procedure Laterality Date  . COLONOSCOPY    . DENTAL SURGERY  12/2014  . INGUINAL HERNIA REPAIR    . NOSE SURGERY     submucous surgery  . POLYPECTOMY    . SINUS SURGERY WITH INSTATRAK    . SUBCLAVIAN BYPASS GRAFT  2009  . TESTICLE REMOVAL     Social History   Socioeconomic History  . Marital status: Married    Spouse name: Not on file  . Number of children: 2  . Years of  education: Not on file  . Highest education level: 12th grade  Occupational History  . Occupation: retired    Comment: Architect  Tobacco Use  . Smoking status: Never Smoker  . Smokeless tobacco: Never Used  Vaping Use  . Vaping Use: Never used  Substance and Sexual Activity  . Alcohol use: Yes    Alcohol/week: 7.0 - 10.0 standard drinks    Types: 7 - 10 Shots of liquor per week    Comment: mixed drinks- 1/2 a day  . Drug use: No  . Sexual activity: Yes    Birth control/protection: None  Other Topics Concern  . Not on file  Social History Narrative  . Not on file   Social Determinants of Health   Financial Resource Strain: Low Risk   . Difficulty of Paying Living Expenses: Not hard at all  Food Insecurity: No Food Insecurity  . Worried About Charity fundraiser in the Last Year: Never true  . Ran Out of Food in the Last Year: Never true  Transportation Needs: No Transportation Needs  . Lack of Transportation (Medical): No  . Lack of Transportation (Non-Medical): No  Physical Activity: Inactive  . Days of Exercise per Week: 0 days  . Minutes of Exercise per Session: 0  min  Stress: No Stress Concern Present  . Feeling of Stress : Not at all  Social Connections: Moderately Integrated  . Frequency of Communication with Friends and Family: More than three times a week  . Frequency of Social Gatherings with Friends and Family: More than three times a week  . Attends Religious Services: More than 4 times per year  . Active Member of Clubs or Organizations: No  . Attends Archivist Meetings: Never  . Marital Status: Married  Human resources officer Violence: Not At Risk  . Fear of Current or Ex-Partner: No  . Emotionally Abused: No  . Physically Abused: No  . Sexually Abused: No   Family Status  Relation Name Status  . Sister  Alive  . Sister  Deceased  . Sister  Alive  . Father  Deceased  . Mother  Deceased  . Sister  Alive  . Brother  Deceased  . Sister   Alive  . Neg Hx  (Not Specified)   Family History  Problem Relation Age of Onset  . Colon cancer Sister 70  . Breast cancer Sister   . Heart attack Sister   . Heart attack Sister        deceased from MI  . Allergies Father   . Heart attack Father   . CAD Father        deceased 80 from fall and possible subdural hematoma  . Congestive Heart Failure Mother   . Emphysema Mother        deceased 74 with emphysema, tobacco abuse  . Asthma Sister   . CAD Brother   . Stroke Brother   . Lung cancer Brother   . Arthritis Sister   . Esophageal cancer Neg Hx   . Stomach cancer Neg Hx   . Rectal cancer Neg Hx   . Colon polyps Neg Hx    No Known Allergies  Patient Care Team: Jerrol Banana., MD as PCP - General (Unknown Physician Specialty) Pa, [place] as Consulting Physician (Optometry) Irene Shipper, MD as Consulting Physician (Gastroenterology) Eulogio Bear, MD as Consulting Physician (Ophthalmology) Erby Pian, MD as Referring Physician (Specialist) Earnestine Leys, MD (Orthopedic Surgery) Sharlotte Alamo, [place] (Podiatry) Hollice Espy, MD as Consulting Physician (Urology) Royston Cowper, MD as Consulting Physician (Urology)   Medications: Outpatient Medications Prior to Visit  Medication Sig  . aspirin 81 MG tablet Take 81 mg by mouth daily.  Marland Kitchen atorvastatin (LIPITOR) 20 MG tablet TAKE 1 TABLET BY MOUTH EVERY DAY  . fluticasone (FLONASE) 50 MCG/ACT nasal spray Place 2 sprays into both nostrils daily. (Patient taking differently: Place 2 sprays into both nostrils daily. As needed)  . folic acid (FOLVITE) 314 MCG tablet Take 400 mcg by mouth daily.  Marland Kitchen ibuprofen (ADVIL,MOTRIN) 200 MG tablet Take 200 mg by mouth every 4 (four) hours as needed.   . levalbuterol (XOPENEX HFA) 45 MCG/ACT inhaler Inhale 1-2 puffs into the lungs every 6 (six) hours as needed for wheezing.  Marland Kitchen levothyroxine (SYNTHROID) 50 MCG tablet TAKE 1 TABLET BY MOUTH EVERY DAY  . magnesium  oxide (MAG-OX) 400 MG tablet Take 400 mg by mouth 2 (two) times daily.  . meloxicam (MOBIC) 15 MG tablet Take 15 mg by mouth daily.  . meloxicam (MOBIC) 7.5 MG tablet Take 7.5 mg by mouth 2 (two) times daily.  . Multiple Vitamins-Minerals (CENTRUM SILVER ADULT 50+ PO) Take 1 tablet by mouth daily.  . Naproxen Sodium (ALEVE) 220  MG CAPS as needed.  . Oxymetazoline HCl (AFRIN NASAL SPRAY NA) Place into the nose as needed.    Marland Kitchen Phenylephrine-APAP-Guaifenesin (MUCINEX SINUS-MAX CONGESTION) 5-325-200 MG TABS Per bottle directions as needed  . Potassium 75 MG TABS Take 99 mg by mouth daily.   . SYMBICORT 160-4.5 MCG/ACT inhaler INHALE 1-2 PUFFS TWICE DAILY  . sildenafil (REVATIO) 20 MG tablet Take 2 to 4 tablets daily as needed (Patient not taking: Reported on 06/15/2020)  . [DISCONTINUED] albuterol (PROAIR HFA) 108 (90 Base) MCG/ACT inhaler Inhale 2 puffs into the lungs every 4 (four) hours as needed.  . [DISCONTINUED] albuterol (VENTOLIN HFA) 108 (90 Base) MCG/ACT inhaler Inhale 2 puffs into the lungs every 6 (six) hours as needed for wheezing or shortness of breath. (Patient not taking: Reported on 06/15/2020)  . [DISCONTINUED] hydrocortisone (ANUSOL-HC) 25 MG suppository Place 1 suppository (25 mg total) rectally 2 (two) times daily. (Patient not taking: Reported on 06/15/2020)  . [DISCONTINUED] methocarbamol (ROBAXIN) 500 MG tablet Take 500 mg by mouth 2 (two) times daily. (Patient not taking: Reported on 06/15/2020)  . [DISCONTINUED] mometasone-formoterol (DULERA) 100-5 MCG/ACT AERO Inhale 2 puffs into the lungs daily. (Patient not taking: Reported on 06/15/2020)  . [DISCONTINUED] predniSONE (DELTASONE) 10 MG tablet Take 10 mg by mouth daily. (Patient not taking: Reported on 06/15/2020)   No facility-administered medications prior to visit.    Review of Systems  Constitutional: Positive for fatigue.  HENT: Positive for congestion and sinus pressure.   Eyes: Positive for photophobia and itching.    Respiratory: Positive for apnea, shortness of breath and wheezing.   Endocrine: Positive for heat intolerance.  Musculoskeletal: Positive for back pain.  Psychiatric/Behavioral: Positive for sleep disturbance.  All other systems reviewed and are negative.     Objective    BP 110/72 (BP Location: Right Arm, Patient Position: Sitting, Cuff Size: Large)   Pulse 65   Temp 98.4 F (36.9 C) (Oral)   Ht 6\' 1"  (1.854 m)   Wt 261 lb 12.8 oz (118.8 kg)   SpO2 93%   BMI 34.54 kg/m    Physical Exam Vitals reviewed.  Constitutional:      Appearance: He is well-developed. He is obese.  HENT:     Head: Normocephalic and atraumatic.     Right Ear: External ear normal.     Left Ear: External ear normal.     Nose: Nose normal.  Eyes:     General: No scleral icterus.    Conjunctiva/sclera: Conjunctivae normal.  Neck:     Thyroid: No thyromegaly.  Cardiovascular:     Rate and Rhythm: Normal rate and regular rhythm.     Heart sounds: Normal heart sounds.  Pulmonary:     Effort: Pulmonary effort is normal.  Abdominal:     Palpations: Abdomen is soft.  Musculoskeletal:     Right lower leg: No edema.     Left lower leg: No edema.  Skin:    General: Skin is warm and dry.  Neurological:     General: No focal deficit present.     Mental Status: He is alert and oriented to person, place, and time.     Comments: Left greater than right hand tremor.  There is some cogwheeling noted on exam also.  Gait is normal.  Psychiatric:        Mood and Affect: Mood normal.        Behavior: Behavior normal.        Thought Content: Thought content normal.  Judgment: Judgment normal.      General Appearance:    Obese male. Alert, cooperative, in no acute distress, appears stated age  Head:    Normocephalic, without obvious abnormality, atraumatic  Eyes:    PERRL, conjunctiva/corneas clear, EOM's intact, fundi    benign, both eyes       Ears:    Normal TM's and external ear canals, both  ears  Nose:   Nares normal, septum midline, mucosa normal, no drainage   or sinus tenderness  Throat:   Lips, mucosa, and tongue normal; teeth and gums normal  Neck:   Supple, symmetrical, trachea midline, no adenopathy;       thyroid:  No enlargement/tenderness/nodules; no carotid   bruit or JVD  Back:     Symmetric, no curvature, ROM normal, no CVA tenderness  Lungs:     Clear to auscultation bilaterally, respirations unlabored  Chest wall:    No tenderness or deformity  Heart:    Normal heart rate. Normal rhythm. No murmurs, rubs, or gallops.  S1 and S2 normal  Abdomen:     Soft, non-tender, bowel sounds active all four quadrants,    no masses, no organomegaly  Genitalia:    normal, deferred  Rectal:    deferred  Extremities:   All extremities are intact. No cyanosis or edema  Pulses:   2+ and symmetric all extremities  Skin:   Skin color, texture, turgor normal, no rashes or lesions  Lymph nodes:   Cervical, supraclavicular, and axillary nodes normal  Neurologic:   CNII-XII intact. Normal strength, sensation and reflexes      throughout     Last depression screening scores PHQ 2/9 Scores 06/15/2020 06/13/2019 06/06/2018  PHQ - 2 Score 2 0 1  PHQ- 9 Score 6 - 4   Last fall risk screening Fall Risk  06/15/2020  Falls in the past year? 0  Number falls in past yr: 0  Injury with Fall? 0   Last Audit-C alcohol use screening Alcohol Use Disorder Test (AUDIT) 06/15/2020  1. How often do you have a drink containing alcohol? 4  2. How many drinks containing alcohol do you have on a typical day when you are drinking? 0  3. How often do you have six or more drinks on one occasion? 0  AUDIT-C Score 4  4. How often during the last year have you found that you were not able to stop drinking once you had started? 0  5. How often during the last year have you failed to do what was normally expected from you because of drinking? 0  6. How often during the last year have you needed a first  drink in the morning to get yourself going after a heavy drinking session? 0  7. How often during the last year have you had a feeling of guilt of remorse after drinking? 0  8. How often during the last year have you been unable to remember what happened the night before because you had been drinking? 0  9. Have you or someone else been injured as a result of your drinking? 0  10. Has a relative or friend or a doctor or another health worker been concerned about your drinking or suggested you cut down? 0  Alcohol Use Disorder Identification Test Final Score (AUDIT) 4  Alcohol Brief Interventions/Follow-up AUDIT Score <7 follow-up not indicated   A score of 3 or more in women, and 4 or more in men indicates increased risk  for alcohol abuse, EXCEPT if all of the points are from question 1   No results found for any visits on 06/15/20.  Assessment & Plan    Routine Health Maintenance and Physical Exam  Exercise Activities and Dietary recommendations Goals    . DIET - INCREASE WATER INTAKE     Recommend increasing water intake to 4-6 glasses a day.     . Exercise 3x per week (30 min per time)     Recommend to exercise for 3 days a week for at least 30 minutes at a time.        Immunization History  Administered Date(s) Administered  . Fluad Quad(high Dose 65+) 06/26/2019  . Influenza Whole 07/31/2012  . Influenza, High Dose Seasonal PF 10/10/2016, 09/26/2017  . Pneumococcal Conjugate-13 08/04/2014  . Pneumococcal Polysaccharide-23 05/24/2017  . Td 07/30/2004  . Tdap 08/04/2014, 11/24/2016    Health Maintenance  Topic Date Due  . COVID-19 Vaccine (1) Never done  . INFLUENZA VACCINE  05/31/2020  . COLONOSCOPY  07/16/2022  . TETANUS/TDAP  11/24/2026  . Hepatitis C Screening  Completed  . PNA vac Low Risk Adult  Completed    Discussed health benefits of physical activity, and encouraged him to engage in regular exercise appropriate for his age and condition.  1. Annual  physical exam Diet and exercise stress as patient has gained some weight during the Covid pandemic. - CBC - Comprehensive metabolic panel - Lipid panel - TSH  2. Essential (primary) hypertension  - CBC - Comprehensive metabolic panel - Lipid panel - TSH  3. Adult hypothyroidism  - CBC - Comprehensive metabolic panel - Lipid panel - TSH  4. Hyperlipidemia, unspecified hyperlipidemia type  - CBC - Comprehensive metabolic panel - Lipid panel - TSH 5.  Benign essential tremor Watch for any further symptoms suggestive of PD  Return in about 6 months (around 12/16/2020).        Amarion Portell Cranford Mon, MD  [facility] [phone] (phone) [phone] (fax)  [place]

## 2020-06-11 NOTE — Progress Notes (Signed)
Subjective:   Matthew [name] is a 74 y.o. male who presents for Medicare Annual/Subsequent preventive examination.  Review of Systems    N/A  Cardiac Risk Factors include: advanced age (>7men, >79 women);male gender;sedentary lifestyle;dyslipidemia;hypertension;obesity (BMI >30kg/m2)     Objective:    Today's Vitals   06/15/20 1002  BP: 110/72  Pulse: 65  Temp: 98.4 F (36.9 C)  TempSrc: Oral  SpO2: 93%  Weight: 261 lb 12.8 oz (118.8 kg)  Height: 6\' 1"  (1.854 m)  PainSc: 0-No pain   Body mass index is 34.54 kg/m.  Advanced Directives 06/15/2020 06/13/2019 06/06/2018 05/24/2017 05/17/2017 10/03/2016  Does Patient Have a Medical Advance Directive? Yes Yes Yes Yes Yes Yes  Type of Paramedic of [place];Living will [place];Living will [place];Living will [place];Living will [place];Living will Living will;Healthcare Power of [place] in Chart? No - copy requested No - copy requested No - copy requested No - copy requested - -    Current Medications (verified) Outpatient Encounter Medications as of 06/15/2020  Medication Sig  . aspirin 81 MG tablet Take 81 mg by mouth daily.  Marland Kitchen atorvastatin (LIPITOR) 20 MG tablet TAKE 1 TABLET BY MOUTH EVERY DAY  . fluticasone (FLONASE) 50 MCG/ACT nasal spray Place 2 sprays into both nostrils daily. (Patient taking differently: Place 2 sprays into both nostrils daily. As needed)  . folic acid (FOLVITE) 025 MCG tablet Take 400 mcg by mouth daily.  Marland Kitchen ibuprofen (ADVIL,MOTRIN) 200 MG tablet Take 200 mg by mouth every 4 (four) hours as needed.   . levalbuterol (XOPENEX HFA) 45 MCG/ACT inhaler Inhale 1-2 puffs into the lungs every 6 (six) hours as needed for wheezing.  Marland Kitchen levothyroxine (SYNTHROID) 50 MCG tablet TAKE 1 TABLET BY MOUTH EVERY DAY  . magnesium oxide (MAG-OX) 400 MG tablet Take 400 mg by mouth 2  (two) times daily.  . meloxicam (MOBIC) 15 MG tablet Take 15 mg by mouth daily.  . meloxicam (MOBIC) 7.5 MG tablet Take 7.5 mg by mouth 2 (two) times daily.  . Multiple Vitamins-Minerals (CENTRUM SILVER ADULT 50+ PO) Take 1 tablet by mouth daily.  . Naproxen Sodium (ALEVE) 220 MG CAPS as needed.  . Oxymetazoline HCl (AFRIN NASAL SPRAY NA) Place into the nose as needed.    Marland Kitchen Phenylephrine-APAP-Guaifenesin (MUCINEX SINUS-MAX CONGESTION) 5-325-200 MG TABS Per bottle directions as needed  . Potassium 75 MG TABS Take 99 mg by mouth daily.   . SYMBICORT 160-4.5 MCG/ACT inhaler INHALE 1-2 PUFFS TWICE DAILY  . sildenafil (REVATIO) 20 MG tablet Take 2 to 4 tablets daily as needed (Patient not taking: Reported on 06/15/2020)  . [DISCONTINUED] albuterol (PROAIR HFA) 108 (90 Base) MCG/ACT inhaler Inhale 2 puffs into the lungs every 4 (four) hours as needed.  . [DISCONTINUED] albuterol (VENTOLIN HFA) 108 (90 Base) MCG/ACT inhaler Inhale 2 puffs into the lungs every 6 (six) hours as needed for wheezing or shortness of breath. (Patient not taking: Reported on 06/15/2020)  . [DISCONTINUED] hydrocortisone (ANUSOL-HC) 25 MG suppository Place 1 suppository (25 mg total) rectally 2 (two) times daily. (Patient not taking: Reported on 06/15/2020)  . [DISCONTINUED] methocarbamol (ROBAXIN) 500 MG tablet Take 500 mg by mouth 2 (two) times daily. (Patient not taking: Reported on 06/15/2020)  . [DISCONTINUED] mometasone-formoterol (DULERA) 100-5 MCG/ACT AERO Inhale 2 puffs into the lungs daily. (Patient not taking: Reported on 06/15/2020)  . [DISCONTINUED] predniSONE (DELTASONE)  10 MG tablet Take 10 mg by mouth daily. (Patient not taking: Reported on 06/15/2020)   No facility-administered encounter medications on file as of 06/15/2020.    Allergies (verified) Patient has no known allergies.   History: Past Medical History:  Diagnosis Date  . Allergic rhinitis, cause unspecified   . Allergy   . Arthritis   . Asthma   .  Benign neoplasm of colon   . Clotting disorder ([place])    arterial bypass   . Collapsed lung   . COPD (chronic obstructive pulmonary disease) ([place])   . ED (erectile dysfunction)   . GERD (gastroesophageal reflux disease)   . Hyperlipidemia   . Hypertension   . Internal hemorrhoids without mention of complication   . Left testicular cancer ([place]) 07/2014  . OSA (obstructive sleep apnea)    wears CPAP  . Sleep apnea    occasional CPAP  . Thyroid disease   . Unspecified asthma(493.90)   . Unspecified sinusitis (chronic)    Past Surgical History:  Procedure Laterality Date  . COLONOSCOPY    . DENTAL SURGERY  12/2014  . INGUINAL HERNIA REPAIR    . NOSE SURGERY     submucous surgery  . POLYPECTOMY    . SINUS SURGERY WITH INSTATRAK    . SUBCLAVIAN BYPASS GRAFT  2009  . TESTICLE REMOVAL     Family History  Problem Relation Age of Onset  . Colon cancer Sister 81  . Breast cancer Sister   . Heart attack Sister   . Heart attack Sister        deceased from MI  . Allergies Father   . Heart attack Father   . CAD Father        deceased 23 from fall and possible subdural hematoma  . Congestive Heart Failure Mother   . Emphysema Mother        deceased 85 with emphysema, tobacco abuse  . Asthma Sister   . CAD Brother   . Stroke Brother   . Lung cancer Brother   . Arthritis Sister   . Esophageal cancer Neg Hx   . Stomach cancer Neg Hx   . Rectal cancer Neg Hx   . Colon polyps Neg Hx    Social History   Socioeconomic History  . Marital status: Married    Spouse name: Not on file  . Number of children: 2  . Years of education: Not on file  . Highest education level: 12th grade  Occupational History  . Occupation: retired    Comment: Architect  Tobacco Use  . Smoking status: Never Smoker  . Smokeless tobacco: Never Used  Vaping Use  . Vaping Use: Never used  Substance and Sexual Activity  . Alcohol use: Yes    Alcohol/week: 7.0 - 10.0 standard drinks    Types: 7 -  10 Shots of liquor per week    Comment: mixed drinks- 1/2 a day  . Drug use: No  . Sexual activity: Yes    Birth control/protection: None  Other Topics Concern  . Not on file  Social History Narrative  . Not on file   Social Determinants of Health   Financial Resource Strain: Low Risk   . Difficulty of Paying Living Expenses: Not hard at all  Food Insecurity: No Food Insecurity  . Worried About Charity fundraiser in the Last Year: Never true  . Ran Out of Food in the Last Year: Never true  Transportation Needs: No Transportation Needs  .  Lack of Transportation (Medical): No  . Lack of Transportation (Non-Medical): No  Physical Activity: Inactive  . Days of Exercise per Week: 0 days  . Minutes of Exercise per Session: 0 min  Stress: No Stress Concern Present  . Feeling of Stress : Not at all  Social Connections: Moderately Integrated  . Frequency of Communication with Friends and Family: More than three times a week  . Frequency of Social Gatherings with Friends and Family: More than three times a week  . Attends Religious Services: More than 4 times per year  . Active Member of Clubs or Organizations: No  . Attends Archivist Meetings: Never  . Marital Status: Married    Tobacco Counseling Counseling given: Not Answered   Clinical Intake:  Pre-visit preparation completed: Yes  Pain : No/denies pain Pain Score: 0-No pain     Nutritional Status: BMI > 30  Obese Nutritional Risks: None Diabetes: No  How often do you need to have someone help you when you read instructions, pamphlets, or other written materials from your doctor or pharmacy?: 1 - Never  Diabetic? No  Interpreter Needed?: No  Information entered by :: [facility], LPN   Activities of Daily Living In your present state of health, do you have any difficulty performing the following activities: 06/15/2020  Hearing? Y  Comment Wears bilateral hearing aids.  Vision? N  Difficulty  concentrating or making decisions? N  Walking or climbing stairs? Y  Comment Due to SOB.  Dressing or bathing? N  Doing errands, shopping? N  Preparing Food and eating ? N  Using the Toilet? N  In the past six months, have you accidently leaked urine? N  Do you have problems with loss of bowel control? N  Managing your Medications? N  Managing your Finances? N  Housekeeping or managing your Housekeeping? N  Some recent data might be hidden    Patient Care Team: Jerrol Banana., MD as PCP - General (Unknown Physician Specialty) [place], [place] as Consulting Physician (Optometry) Irene Shipper, MD as Consulting Physician (Gastroenterology) Eulogio Bear, MD as Consulting Physician (Ophthalmology) Erby Pian, MD as Referring Physician (Specialist) Earnestine Leys, MD (Orthopedic Surgery) Sharlotte Alamo, [place] (Podiatry) Hollice Espy, MD as Consulting Physician (Urology) Royston Cowper, MD as Consulting Physician (Urology)  Indicate any recent Medical Services you may have received from other than [facility] providers in the past year (date may be approximate).     Assessment:   This is a routine wellness examination for [place].  Hearing/Vision screen No exam data present  Dietary issues and exercise activities discussed: Current Exercise Habits: The patient does not participate in regular exercise at present, Exercise limited by: None identified  Goals    . DIET - INCREASE WATER INTAKE     Recommend increasing water intake to 4-6 glasses a day.     . Exercise 3x per week (30 min per time)     Recommend to exercise for 3 days a week for at least 30 minutes at a time.       Depression Screen PHQ 2/9 Scores 06/15/2020 06/13/2019 06/06/2018 06/06/2018 05/24/2017 05/24/2017 02/17/2016  PHQ - 2 Score 2 0 1 1 0 0 0  PHQ- 9 Score 6 - 4 - 5 - -    Fall Risk Fall Risk  06/15/2020 06/13/2019 06/06/2018 05/24/2017 02/17/2016  Falls in the past year? 0 0 No No No  Number  falls in past yr: 0 - - - -  Injury with Fall? 0 - - - -    Any stairs in or around the home? Yes  If so, are there any without handrails? No  Home free of loose throw rugs in walkways, pet beds, electrical cords, etc? Yes  Adequate lighting in your home to reduce risk of falls? Yes   ASSISTIVE DEVICES UTILIZED TO PREVENT FALLS:  Life alert? No  Use of a cane, walker or w/c? No  Grab bars in the bathroom? Yes  Shower chair or bench in shower? Yes  Elevated toilet seat or a handicapped toilet? Yes   TIMED UP AND GO:  Was the test performed? Yes .  Length of time to ambulate 10 feet: 8 sec.   Gait steady and fast without use of assistive device  Cognitive Function: Declined today.     6CIT Screen 05/24/2017  What Year? 0 points  What month? 0 points  What time? 3 points  Count back from 20 0 points  Months in reverse 0 points  Repeat phrase 0 points  Total Score 3    Immunizations Immunization History  Administered Date(s) Administered  . Fluad Quad(high Dose 65+) 06/26/2019  . Influenza Whole 07/31/2012  . Influenza, High Dose Seasonal PF 10/10/2016, 09/26/2017  . Pneumococcal Conjugate-13 08/04/2014  . Pneumococcal Polysaccharide-23 05/24/2017  . Td 07/30/2004  . Tdap 08/04/2014, 11/24/2016    TDAP status: Up to date Flu Vaccine status: Due fall 2021 Pneumococcal vaccine status: Up to date Covid-19 vaccine status: Completed vaccines  Qualifies for Shingles Vaccine? Yes   Zostavax completed No   Shingrix Completed?: No.    Education has been provided regarding the importance of this vaccine. Patient has been advised to call insurance company to determine out of pocket expense if they have not yet received this vaccine. Advised may also receive vaccine at local pharmacy or Health Dept. Verbalized acceptance and understanding.  Screening Tests Health Maintenance  Topic Date Due  . COVID-19 Vaccine (1) Never done  . INFLUENZA VACCINE  05/31/2020  .  COLONOSCOPY  07/16/2022  . TETANUS/TDAP  11/24/2026  . Hepatitis C Screening  Completed  . PNA vac Low Risk Adult  Completed    Health Maintenance  Health Maintenance Due  Topic Date Due  . COVID-19 Vaccine (1) Never done  . INFLUENZA VACCINE  05/31/2020    Colorectal cancer screening: Completed 07/17/19. Repeat every 3 years  Lung Cancer Screening: (Low Dose CT Chest recommended if Age 49-80 years, 30 pack-year currently smoking OR have quit w/in 15years.) does not qualify.    Additional Screening:  Hepatitis C Screening: Up to date  Vision Screening: Recommended annual ophthalmology exams for early detection of glaucoma and other disorders of the eye. Is the patient up to date with their annual eye exam?  Yes  Who is the provider or what is the name of the office in which the patient attends annual eye exams? Dr Edison Pace @ [place] If pt is not established with a provider, would they like to be referred to a provider to establish care? No .   Dental Screening: Recommended annual dental exams for proper oral hygiene  Community Resource Referral / Chronic Care Management: CRR required this visit?  No   CCM required this visit?  No      Plan:     I have personally reviewed and noted the following in the patient's chart:   . Medical and social history . Use of alcohol, tobacco or illicit drugs  . Current  medications and supplements . Functional ability and status . Nutritional status . Physical activity . Advanced directives . List of other physicians . Hospitalizations, surgeries, and ER visits in previous 12 months . Vitals . Screenings to include cognitive, depression, and falls . Referrals and appointments  In addition, I have reviewed and discussed with patient certain preventive protocols, quality metrics, and best practice recommendations. A written personalized care plan for preventive services as well as general preventive health recommendations were provided to  patient.     Keyante Durio [place], [place]   [phone]   Nurse Notes: Advised pt to bring in his Covid vaccine card to next in office apt to update chart.

## 2020-06-15 ENCOUNTER — Ambulatory Visit (INDEPENDENT_AMBULATORY_CARE_PROVIDER_SITE_OTHER): Payer: Medicare Other

## 2020-06-15 ENCOUNTER — Encounter: Payer: Self-pay | Admitting: Family Medicine

## 2020-06-15 ENCOUNTER — Ambulatory Visit (INDEPENDENT_AMBULATORY_CARE_PROVIDER_SITE_OTHER): Payer: Medicare Other | Admitting: Family Medicine

## 2020-06-15 ENCOUNTER — Other Ambulatory Visit: Payer: Self-pay

## 2020-06-15 VITALS — BP 110/72 | HR 65 | Temp 98.4°F | Ht 73.0 in | Wt 261.8 lb

## 2020-06-15 DIAGNOSIS — Z0000 Encounter for general adult medical examination without abnormal findings: Secondary | ICD-10-CM

## 2020-06-15 DIAGNOSIS — I10 Essential (primary) hypertension: Secondary | ICD-10-CM

## 2020-06-15 DIAGNOSIS — E039 Hypothyroidism, unspecified: Secondary | ICD-10-CM | POA: Diagnosis not present

## 2020-06-15 DIAGNOSIS — G250 Essential tremor: Secondary | ICD-10-CM

## 2020-06-15 DIAGNOSIS — E785 Hyperlipidemia, unspecified: Secondary | ICD-10-CM

## 2020-06-15 NOTE — Patient Instructions (Signed)
Matthew [name] , Thank you for taking time to come for your Medicare Wellness Visit. I appreciate your ongoing commitment to your health goals. Please review the following plan we discussed and let me know if I can assist you in the future.   Screening recommendations/referrals: Colonoscopy: Up to date, due 07/2022 Recommended yearly ophthalmology/optometry visit for glaucoma screening and checkup Recommended yearly dental visit for hygiene and checkup  Vaccinations: Influenza vaccine: Due fall 2021 Pneumococcal vaccine: Completed series Tdap vaccine: Up to date, due 10/2026 Shingles vaccine: Shingrix discussed. Please contact your pharmacy for coverage information.     Advanced directives: Please bring a copy of your POA (Power of Attorney) and/or Living Will to your next appointment.   Conditions/risks identified: Recommend to start exercising 3 days a week for at least 30 minutes at a time. Also recommend to continue to increase water intake to 6-8 8 oz glasses a day.   Next appointment: 10:40 AM today with Dr Rosanna Randy   Preventive Care 60 Years and Older, Male Preventive care refers to lifestyle choices and visits with your health care provider that can promote health and wellness. What does preventive care include?  A yearly physical exam. This is also called an annual well check.  Dental exams once or twice a year.  Routine eye exams. Ask your health care provider how often you should have your eyes checked.  Personal lifestyle choices, including:  Daily care of your teeth and gums.  Regular physical activity.  Eating a healthy diet.  Avoiding tobacco and drug use.  Limiting alcohol use.  Practicing safe sex.  Taking low doses of aspirin every day.  Taking vitamin and mineral supplements as recommended by your health care provider. What happens during an annual well check? The services and screenings done by your health care provider during your annual well check will  depend on your age, overall health, lifestyle risk factors, and family history of disease. Counseling  Your health care provider may ask you questions about your:  Alcohol use.  Tobacco use.  Drug use.  Emotional well-being.  Home and relationship well-being.  Sexual activity.  Eating habits.  History of falls.  Memory and ability to understand (cognition).  Work and work Statistician. Screening  You may have the following tests or measurements:  Height, weight, and BMI.  Blood pressure.  Lipid and cholesterol levels. These may be checked every 5 years, or more frequently if you are over 15 years old.  Skin check.  Lung cancer screening. You may have this screening every year starting at age 89 if you have a 30-pack-year history of smoking and currently smoke or have quit within the past 15 years.  Fecal occult blood test (FOBT) of the stool. You may have this test every year starting at age 72.  Flexible sigmoidoscopy or colonoscopy. You may have a sigmoidoscopy every 5 years or a colonoscopy every 10 years starting at age 39.  Prostate cancer screening. Recommendations will vary depending on your family history and other risks.  Hepatitis C blood test.  Hepatitis B blood test.  Sexually transmitted disease (STD) testing.  Diabetes screening. This is done by checking your blood sugar (glucose) after you have not eaten for a while (fasting). You may have this done every 1-3 years.  Abdominal aortic aneurysm (AAA) screening. You may need this if you are a current or former smoker.  Osteoporosis. You may be screened starting at age 7 if you are at high risk. Talk with  your health care provider about your test results, treatment options, and if necessary, the need for more tests. Vaccines  Your health care provider may recommend certain vaccines, such as:  Influenza vaccine. This is recommended every year.  Tetanus, diphtheria, and acellular pertussis (Tdap,  Td) vaccine. You may need a Td booster every 10 years.  Zoster vaccine. You may need this after age 62.  Pneumococcal 13-valent conjugate (PCV13) vaccine. One dose is recommended after age 48.  Pneumococcal polysaccharide (PPSV23) vaccine. One dose is recommended after age 83. Talk to your health care provider about which screenings and vaccines you need and how often you need them. This information is not intended to replace advice given to you by your health care provider. Make sure you discuss any questions you have with your health care provider. Document Released: 11/13/2015 Document Revised: 07/06/2016 Document Reviewed: 08/18/2015 Elsevier Interactive Patient Education  2017 [place] Prevention in the Home Falls can cause injuries. They can happen to people of all ages. There are many things you can do to make your home safe and to help prevent falls. What can I do on the outside of my home?  Regularly fix the edges of walkways and driveways and fix any cracks.  Remove anything that might make you trip as you walk through a door, such as a raised step or threshold.  Trim any bushes or trees on the path to your home.  Use bright outdoor lighting.  Clear any walking paths of anything that might make someone trip, such as rocks or tools.  Regularly check to see if handrails are loose or broken. Make sure that both sides of any steps have handrails.  Any raised decks and porches should have guardrails on the edges.  Have any leaves, snow, or ice cleared regularly.  Use sand or salt on walking paths during winter.  Clean up any spills in your garage right away. This includes oil or grease spills. What can I do in the bathroom?  Use night lights.  Install grab bars by the toilet and in the tub and shower. Do not use towel bars as grab bars.  Use non-skid mats or decals in the tub or shower.  If you need to sit down in the shower, use a plastic, non-slip  stool.  Keep the floor dry. Clean up any water that spills on the floor as soon as it happens.  Remove soap buildup in the tub or shower regularly.  Attach bath mats securely with double-sided non-slip rug tape.  Do not have throw rugs and other things on the floor that can make you trip. What can I do in the bedroom?  Use night lights.  Make sure that you have a light by your bed that is easy to reach.  Do not use any sheets or blankets that are too big for your bed. They should not hang down onto the floor.  Have a firm chair that has side arms. You can use this for support while you get dressed.  Do not have throw rugs and other things on the floor that can make you trip. What can I do in the kitchen?  Clean up any spills right away.  Avoid walking on wet floors.  Keep items that you use a lot in easy-to-reach places.  If you need to reach something above you, use a strong step stool that has a grab bar.  Keep electrical cords out of the way.  Do not  use floor polish or wax that makes floors slippery. If you must use wax, use non-skid floor wax.  Do not have throw rugs and other things on the floor that can make you trip. What can I do with my stairs?  Do not leave any items on the stairs.  Make sure that there are handrails on both sides of the stairs and use them. Fix handrails that are broken or loose. Make sure that handrails are as long as the stairways.  Check any carpeting to make sure that it is firmly attached to the stairs. Fix any carpet that is loose or worn.  Avoid having throw rugs at the top or bottom of the stairs. If you do have throw rugs, attach them to the floor with carpet tape.  Make sure that you have a light switch at the top of the stairs and the bottom of the stairs. If you do not have them, ask someone to add them for you. What else can I do to help prevent falls?  Wear shoes that:  Do not have high heels.  Have rubber bottoms.  Are  comfortable and fit you well.  Are closed at the toe. Do not wear sandals.  If you use a stepladder:  Make sure that it is fully opened. Do not climb a closed stepladder.  Make sure that both sides of the stepladder are locked into place.  Ask someone to hold it for you, if possible.  Clearly mark and make sure that you can see:  Any grab bars or handrails.  First and last steps.  Where the edge of each step is.  Use tools that help you move around (mobility aids) if they are needed. These include:  Canes.  Walkers.  Scooters.  Crutches.  Turn on the lights when you go into a dark area. Replace any light bulbs as soon as they burn out.  Set up your furniture so you have a clear path. Avoid moving your furniture around.  If any of your floors are uneven, fix them.  If there are any pets around you, be aware of where they are.  Review your medicines with your doctor. Some medicines can make you feel dizzy. This can increase your chance of falling. Ask your doctor what other things that you can do to help prevent falls. This information is not intended to replace advice given to you by your health care provider. Make sure you discuss any questions you have with your health care provider. Document Released: 08/13/2009 Document Revised: 03/24/2016 Document Reviewed: 11/21/2014 Elsevier Interactive Patient Education  2017 [employer].

## 2020-06-16 DIAGNOSIS — E785 Hyperlipidemia, unspecified: Secondary | ICD-10-CM | POA: Diagnosis not present

## 2020-06-16 DIAGNOSIS — Z0000 Encounter for general adult medical examination without abnormal findings: Secondary | ICD-10-CM | POA: Diagnosis not present

## 2020-06-16 DIAGNOSIS — I10 Essential (primary) hypertension: Secondary | ICD-10-CM | POA: Diagnosis not present

## 2020-06-16 DIAGNOSIS — E039 Hypothyroidism, unspecified: Secondary | ICD-10-CM | POA: Diagnosis not present

## 2020-06-17 LAB — CBC
Hematocrit: 43.7 % (ref 37.5–51.0)
Hemoglobin: 15 g/dL (ref 13.0–17.7)
MCH: 32.3 pg (ref 26.6–33.0)
MCHC: 34.3 g/dL (ref 31.5–35.7)
MCV: 94 fL (ref 79–97)
Platelets: 246 10*3/uL (ref 150–450)
RBC: 4.65 x10E6/uL (ref 4.14–5.80)
RDW: 12.9 % (ref 11.6–15.4)
WBC: 5.7 10*3/uL (ref 3.4–10.8)

## 2020-06-17 LAB — COMPREHENSIVE METABOLIC PANEL
ALT: 23 IU/L (ref 0–44)
AST: 17 IU/L (ref 0–40)
Albumin/Globulin Ratio: 2 (ref 1.2–2.2)
Albumin: 4.4 g/dL (ref 3.7–4.7)
Alkaline Phosphatase: 59 IU/L (ref 48–121)
BUN/Creatinine Ratio: 21 (ref 10–24)
BUN: 18 mg/dL (ref 8–27)
Bilirubin Total: 0.6 mg/dL (ref 0.0–1.2)
CO2: 25 mmol/L (ref 20–29)
Calcium: 9.5 mg/dL (ref 8.6–10.2)
Chloride: 100 mmol/L (ref 96–106)
Creatinine, Ser: 0.85 mg/dL (ref 0.76–1.27)
GFR calc Af Amer: 99 mL/min/{1.73_m2} (ref >59)
GFR calc non Af Amer: 86 mL/min/{1.73_m2} (ref >59)
Globulin, Total: 2.2 g/dL (ref 1.5–4.5)
Glucose: 98 mg/dL (ref 65–99)
Potassium: 4.3 mmol/L (ref 3.5–5.2)
Sodium: 137 mmol/L (ref 134–144)
Total Protein: 6.6 g/dL (ref 6.0–8.5)

## 2020-06-17 LAB — LIPID PANEL
Chol/HDL Ratio: 4.5 ratio (ref 0.0–5.0)
Cholesterol, Total: 192 mg/dL (ref 100–199)
HDL: 43 mg/dL (ref >39)
LDL Chol Calc (NIH): 116 mg/dL — ABNORMAL HIGH (ref 0–99)
Triglycerides: 186 mg/dL — ABNORMAL HIGH (ref 0–149)
VLDL Cholesterol Cal: 33 mg/dL (ref 5–40)

## 2020-06-17 LAB — TSH: TSH: 5.22 u[IU]/mL — ABNORMAL HIGH (ref 0.450–4.500)

## 2020-06-19 ENCOUNTER — Telehealth: Payer: Self-pay | Admitting: *Deleted

## 2020-06-19 NOTE — Telephone Encounter (Signed)
-----   Message from Jerrol Banana., MD sent at 06/18/2020 11:39 AM EDT ----- Labs in normal range.

## 2020-06-19 NOTE — Telephone Encounter (Signed)
LMOVM for pt to return call or view results in mychart. Okay for [facility] triage nurse to give patient results.

## 2020-06-19 NOTE — Telephone Encounter (Signed)
Pt given lab results per notes of Dr. Rosanna Randy on 06/18/20. Pt verbalized understanding.

## 2020-06-25 DIAGNOSIS — R350 Frequency of micturition: Secondary | ICD-10-CM | POA: Diagnosis not present

## 2020-06-29 DIAGNOSIS — M7741 Metatarsalgia, right foot: Secondary | ICD-10-CM | POA: Diagnosis not present

## 2020-06-29 DIAGNOSIS — G5781 Other specified mononeuropathies of right lower limb: Secondary | ICD-10-CM | POA: Diagnosis not present

## 2020-06-29 DIAGNOSIS — L909 Atrophic disorder of skin, unspecified: Secondary | ICD-10-CM | POA: Diagnosis not present

## 2020-06-29 DIAGNOSIS — M79671 Pain in right foot: Secondary | ICD-10-CM | POA: Diagnosis not present

## 2020-06-29 DIAGNOSIS — M216X1 Other acquired deformities of right foot: Secondary | ICD-10-CM | POA: Diagnosis not present

## 2020-06-30 DIAGNOSIS — J4520 Mild intermittent asthma, uncomplicated: Secondary | ICD-10-CM | POA: Diagnosis not present

## 2020-06-30 DIAGNOSIS — G4733 Obstructive sleep apnea (adult) (pediatric): Secondary | ICD-10-CM | POA: Diagnosis not present

## 2020-06-30 DIAGNOSIS — R05 Cough: Secondary | ICD-10-CM | POA: Diagnosis not present

## 2020-07-03 ENCOUNTER — Other Ambulatory Visit: Payer: Self-pay | Admitting: Family Medicine

## 2020-07-06 DIAGNOSIS — G4733 Obstructive sleep apnea (adult) (pediatric): Secondary | ICD-10-CM | POA: Diagnosis not present

## 2020-07-14 ENCOUNTER — Other Ambulatory Visit: Payer: Self-pay | Admitting: Family Medicine

## 2020-07-14 NOTE — Telephone Encounter (Signed)
Requested Prescriptions  Pending Prescriptions Disp Refills  . levothyroxine (SYNTHROID) 50 MCG tablet [Pharmacy Med Name: LEVOTHYROXINE 50 MCG TABLET] 90 tablet 0    Sig: TAKE 1 TABLET BY MOUTH EVERY DAY     Endocrinology:  Hypothyroid Agents Failed - 07/14/2020  1:24 AM      Failed - TSH needs to be rechecked within 3 months after an abnormal result. Refill until TSH is due.      Failed - TSH in normal range and within 360 days    TSH  Date Value Ref Range Status  06/16/2020 5.220 (H) 0.450 - 4.500 uIU/mL Final         Passed - Valid encounter within last 12 months    Recent Outpatient Visits          4 weeks ago Annual physical exam   [facility] Jerrol Banana., MD   1 year ago Annual physical exam   [facility] Jerrol Banana., MD   2 years ago Hemorrhoids, unspecified hemorrhoid type   [facility] Jerrol Banana., MD   2 years ago Cough   [facility] Birdie Sons, MD   3 years ago BMI 33.0-33.9,adult   [facility] Jerrol Banana., MD      Future Appointments            In 5 months Jerrol Banana., MD [facility], [place]   In 11 months Jerrol Banana., MD [facility], [place]

## 2020-07-15 ENCOUNTER — Ambulatory Visit (INDEPENDENT_AMBULATORY_CARE_PROVIDER_SITE_OTHER): Payer: Medicare Other

## 2020-07-15 ENCOUNTER — Other Ambulatory Visit: Payer: Self-pay

## 2020-07-15 DIAGNOSIS — Z23 Encounter for immunization: Secondary | ICD-10-CM | POA: Diagnosis not present

## 2020-07-20 DIAGNOSIS — L909 Atrophic disorder of skin, unspecified: Secondary | ICD-10-CM | POA: Diagnosis not present

## 2020-07-20 DIAGNOSIS — M216X1 Other acquired deformities of right foot: Secondary | ICD-10-CM | POA: Diagnosis not present

## 2020-07-20 DIAGNOSIS — G5781 Other specified mononeuropathies of right lower limb: Secondary | ICD-10-CM | POA: Diagnosis not present

## 2020-07-20 DIAGNOSIS — M79671 Pain in right foot: Secondary | ICD-10-CM | POA: Diagnosis not present

## 2020-07-20 DIAGNOSIS — M7741 Metatarsalgia, right foot: Secondary | ICD-10-CM | POA: Diagnosis not present

## 2020-07-30 DIAGNOSIS — Z03818 Encounter for observation for suspected exposure to other biological agents ruled out: Secondary | ICD-10-CM | POA: Diagnosis not present

## 2020-07-30 DIAGNOSIS — Z20822 Contact with and (suspected) exposure to covid-19: Secondary | ICD-10-CM | POA: Diagnosis not present

## 2020-08-04 DIAGNOSIS — Z20822 Contact with and (suspected) exposure to covid-19: Secondary | ICD-10-CM | POA: Diagnosis not present

## 2020-08-04 DIAGNOSIS — Z03818 Encounter for observation for suspected exposure to other biological agents ruled out: Secondary | ICD-10-CM | POA: Diagnosis not present

## 2020-08-05 ENCOUNTER — Encounter: Payer: Self-pay | Admitting: Adult Health

## 2020-08-05 ENCOUNTER — Other Ambulatory Visit: Payer: Self-pay | Admitting: Nurse Practitioner

## 2020-08-05 ENCOUNTER — Telehealth (INDEPENDENT_AMBULATORY_CARE_PROVIDER_SITE_OTHER): Payer: Medicare Other | Admitting: Adult Health

## 2020-08-05 ENCOUNTER — Telehealth: Payer: Self-pay | Admitting: Family Medicine

## 2020-08-05 DIAGNOSIS — E039 Hypothyroidism, unspecified: Secondary | ICD-10-CM

## 2020-08-05 DIAGNOSIS — C6292 Malignant neoplasm of left testis, unspecified whether descended or undescended: Secondary | ICD-10-CM

## 2020-08-05 DIAGNOSIS — R059 Cough, unspecified: Secondary | ICD-10-CM

## 2020-08-05 DIAGNOSIS — I2510 Atherosclerotic heart disease of native coronary artery without angina pectoris: Secondary | ICD-10-CM

## 2020-08-05 DIAGNOSIS — G4733 Obstructive sleep apnea (adult) (pediatric): Secondary | ICD-10-CM

## 2020-08-05 DIAGNOSIS — E785 Hyperlipidemia, unspecified: Secondary | ICD-10-CM

## 2020-08-05 DIAGNOSIS — J449 Chronic obstructive pulmonary disease, unspecified: Secondary | ICD-10-CM

## 2020-08-05 DIAGNOSIS — J4520 Mild intermittent asthma, uncomplicated: Secondary | ICD-10-CM

## 2020-08-05 DIAGNOSIS — U071 COVID-19: Secondary | ICD-10-CM

## 2020-08-05 DIAGNOSIS — I10 Essential (primary) hypertension: Secondary | ICD-10-CM

## 2020-08-05 DIAGNOSIS — R0602 Shortness of breath: Secondary | ICD-10-CM

## 2020-08-05 NOTE — Patient Instructions (Addendum)
Advised patient call the office or your primary care doctor for an appointment if no improvement within 72 hours or if any symptoms change or worsen at any time  Advised ER or urgent Care if after hours or on weekend. Call 911 for emergency symptoms at any time.Patinet verbalized understanding of all instructions given/reviewed and treatment plan and has no further questions or concerns at this time.     Upper Respiratory Infection, Adult An upper respiratory infection (URI) affects the nose, throat, and upper air passages. URIs are caused by germs (viruses). The most common type of URI is often called "the common cold." Medicines cannot cure URIs, but you can do things at home to relieve your symptoms. URIs usually get better within 7-10 days. Follow these instructions at home: Activity  Rest as needed.  If you have a fever, stay home from work or school until your fever is gone, or until your doctor says you may return to work or school. ? You should stay home until you cannot spread the infection anymore (you are not contagious). ? Your doctor may have you wear a face mask so you have less risk of spreading the infection. Relieving symptoms  Gargle with a salt-water mixture 3-4 times a day or as needed. To make a salt-water mixture, completely dissolve -1 tsp of salt in 1 cup of warm water.  Use a cool-mist humidifier to add moisture to the air. This can help you breathe more easily. Eating and drinking   Drink enough fluid to keep your pee (urine) pale yellow.  Eat soups and other clear broths. General instructions   Take over-the-counter and prescription medicines only as told by your doctor. These include cold medicines, fever reducers, and cough suppressants.  Do not use any products that contain nicotine or tobacco. These include cigarettes and e-cigarettes. If you need help quitting, ask your doctor.  Avoid being where people are smoking (avoid secondhand smoke).  Make  sure you get regular shots and get the flu shot every year.  Keep all follow-up visits as told by your doctor. This is important. How to avoid spreading infection to others   Wash your hands often with soap and water. If you do not have soap and water, use hand sanitizer.  Avoid touching your mouth, face, eyes, or nose.  Cough or sneeze into a tissue or your sleeve or elbow. Do not cough or sneeze into your hand or into the air. Contact a doctor if:  You are getting worse, not better.  You have any of these: ? A fever. ? Chills. ? Brown or red mucus in your nose. ? Yellow or brown fluid (discharge)coming from your nose. ? Pain in your face, especially when you bend forward. ? Swollen neck glands. ? Pain with swallowing. ? White areas in the back of your throat. Get help right away if:  You have shortness of breath that gets worse.  You have very bad or constant: ? Headache. ? Ear pain. ? Pain in your forehead, behind your eyes, and over your cheekbones (sinus pain). ? Chest pain.  You have long-lasting (chronic) lung disease along with any of these: ? Wheezing. ? Long-lasting cough. ? Coughing up blood. ? A change in your usual mucus.  You have a stiff neck.  You have changes in your: ? Vision. ? Hearing. ? Thinking. ? Mood. Summary  An upper respiratory infection (URI) is caused by a germ called a virus. The most common type of URI  is often called "the common cold."  URIs usually get better within 7-10 days.  Take over-the-counter and prescription medicines only as told by your doctor. This information is not intended to replace advice given to you by your health care provider. Make sure you discuss any questions you have with your health care provider. Document Revised: 10/25/2018 Document Reviewed: 06/09/2017 Elsevier Patient Education  2020 [place]. COVID-19 COVID-19 is a respiratory infection that is caused by a virus called severe acute  respiratory syndrome coronavirus 2 (SARS-CoV-2). The disease is also known as coronavirus disease or novel coronavirus. In some people, the virus may not cause any symptoms. In others, it may cause a serious infection. The infection can get worse quickly and can lead to complications, such as:  Pneumonia, or infection of the lungs.  Acute respiratory distress syndrome or ARDS. This is a condition in which fluid build-up in the lungs prevents the lungs from filling with air and passing oxygen into the blood.  Acute respiratory failure. This is a condition in which there is not enough oxygen passing from the lungs to the body or when carbon dioxide is not passing from the lungs out of the body.  Sepsis or septic shock. This is a serious bodily reaction to an infection.  Blood clotting problems.  Secondary infections due to bacteria or fungus.  Organ failure. This is when your body's organs stop working. The virus that causes COVID-19 is contagious. This means that it can spread from person to person through droplets from coughs and sneezes (respiratory secretions). What are the causes? This illness is caused by a virus. You may catch the virus by:  Breathing in droplets from an infected person. Droplets can be spread by a person breathing, speaking, singing, coughing, or sneezing.  Touching something, like a table or a doorknob, that was exposed to the virus (contaminated) and then touching your mouth, nose, or eyes. What increases the risk? Risk for infection You are more likely to be infected with this virus if you:  Are within 6 feet (2 meters) of a person with COVID-19.  Provide care for or live with a person who is infected with COVID-19.  Spend time in crowded indoor spaces or live in shared housing. Risk for serious illness You are more likely to become seriously ill from the virus if you:  Are 8 years of age or older. The higher your age, the more you are at risk for serious  illness.  Live in a nursing home or long-term care facility.  Have cancer.  Have a long-term (chronic) disease such as: ? Chronic lung disease, including chronic obstructive pulmonary disease or asthma. ? A long-term disease that lowers your body's ability to fight infection (immunocompromised). ? Heart disease, including heart failure, a condition in which the arteries that lead to the heart become narrow or blocked (coronary artery disease), a disease which makes the heart muscle thick, weak, or stiff (cardiomyopathy). ? Diabetes. ? Chronic kidney disease. ? Sickle cell disease, a condition in which red blood cells have an abnormal "sickle" shape. ? Liver disease.  Are obese. What are the signs or symptoms? Symptoms of this condition can range from mild to severe. Symptoms may appear any time from 2 to 14 days after being exposed to the virus. They include:  A fever or chills.  A cough.  Difficulty breathing.  Headaches, body aches, or muscle aches.  Runny or stuffy (congested) nose.  A sore throat.  New loss of  taste or smell. Some people may also have stomach problems, such as nausea, vomiting, or diarrhea. Other people may not have any symptoms of COVID-19. How is this diagnosed? This condition may be diagnosed based on:  Your signs and symptoms, especially if: ? You live in an area with a COVID-19 outbreak. ? You recently traveled to or from an area where the virus is common. ? You provide care for or live with a person who was diagnosed with COVID-19. ? You were exposed to a person who was diagnosed with COVID-19.  A physical exam.  Lab tests, which may include: ? Taking a sample of fluid from the back of your nose and throat (nasopharyngeal fluid), your nose, or your throat using a swab. ? A sample of mucus from your lungs (sputum). ? Blood tests.  Imaging tests, which may include, X-rays, CT scan, or ultrasound. How is this treated? At present, there is no  medicine to treat COVID-19. Medicines that treat other diseases are being used on a trial basis to see if they are effective against COVID-19. Your health care provider will talk with you about ways to treat your symptoms. For most people, the infection is mild and can be managed at home with rest, fluids, and over-the-counter medicines. Treatment for a serious infection usually takes places in a hospital intensive care unit (ICU). It may include one or more of the following treatments. These treatments are given until your symptoms improve.  Receiving fluids and medicines through an IV.  Supplemental oxygen. Extra oxygen is given through a tube in the nose, a face mask, or a hood.  Positioning you to lie on your stomach (prone position). This makes it easier for oxygen to get into the lungs.  Continuous positive airway pressure (CPAP) or bi-level positive airway pressure (BPAP) machine. This treatment uses mild air pressure to keep the airways open. A tube that is connected to a motor delivers oxygen to the body.  Ventilator. This treatment moves air into and out of the lungs by using a tube that is placed in your windpipe.  Tracheostomy. This is a procedure to create a hole in the neck so that a breathing tube can be inserted.  Extracorporeal membrane oxygenation (ECMO). This procedure gives the lungs a chance to recover by taking over the functions of the heart and lungs. It supplies oxygen to the body and removes carbon dioxide. Follow these instructions at home: Lifestyle  If you are sick, stay home except to get medical care. Your health care provider will tell you how long to stay home. Call your health care provider before you go for medical care.  Rest at home as told by your health care provider.  Do not use any products that contain nicotine or tobacco, such as cigarettes, e-cigarettes, and chewing tobacco. If you need help quitting, ask your health care provider.  Return to your  normal activities as told by your health care provider. Ask your health care provider what activities are safe for you. General instructions  Take over-the-counter and prescription medicines only as told by your health care provider.  Drink enough fluid to keep your urine pale yellow.  Keep all follow-up visits as told by your health care provider. This is important. How is this prevented?  There is no vaccine to help prevent COVID-19 infection. However, there are steps you can take to protect yourself and others from this virus. To protect yourself:   Do not travel to areas where COVID-19  is a risk. The areas where COVID-19 is reported change often. To identify high-risk areas and travel restrictions, check the CDC travel website: FatFares.com.br  If you live in, or must travel to, an area where COVID-19 is a risk, take precautions to avoid infection. ? Stay away from people who are sick. ? Wash your hands often with soap and water for 20 seconds. If soap and water are not available, use an alcohol-based hand sanitizer. ? Avoid touching your mouth, face, eyes, or nose. ? Avoid going out in public, follow guidance from your state and local health authorities. ? If you must go out in public, wear a cloth face covering or face mask. Make sure your mask covers your nose and mouth. ? Avoid crowded indoor spaces. Stay at least 6 feet (2 meters) away from others. ? Disinfect objects and surfaces that are frequently touched every day. This may include:  Counters and tables.  Doorknobs and light switches.  Sinks and faucets.  Electronics, such as phones, remote controls, keyboards, computers, and tablets. To protect others: If you have symptoms of COVID-19, take steps to prevent the virus from spreading to others.  If you think you have a COVID-19 infection, contact your health care provider right away. Tell your health care team that you think you may have a COVID-19  infection.  Stay home. Leave your house only to seek medical care. Do not use public transport.  Do not travel while you are sick.  Wash your hands often with soap and water for 20 seconds. If soap and water are not available, use alcohol-based hand sanitizer.  Stay away from other members of your household. Let healthy household members care for children and pets, if possible. If you have to care for children or pets, wash your hands often and wear a mask. If possible, stay in your own room, separate from others. Use a different bathroom.  Make sure that all people in your household wash their hands well and often.  Cough or sneeze into a tissue or your sleeve or elbow. Do not cough or sneeze into your hand or into the air.  Wear a cloth face covering or face mask. Make sure your mask covers your nose and mouth. Where to find more information  Centers for Disease Control and Prevention: PurpleGadgets.be  World Health Organization: https://www.castaneda.info/ Contact a health care provider if:  You live in or have traveled to an area where COVID-19 is a risk and you have symptoms of the infection.  You have had contact with someone who has COVID-19 and you have symptoms of the infection. Get help right away if:  You have trouble breathing.  You have pain or pressure in your chest.  You have confusion.  You have bluish lips and fingernails.  You have difficulty waking from sleep.  You have symptoms that get worse. These symptoms may represent a serious problem that is an emergency. Do not wait to see if the symptoms will go away. Get medical help right away. Call your local emergency services (911 in the [place] Do not drive yourself to the hospital. Let the emergency medical personnel know if you think you have COVID-19. Summary  COVID-19 is a respiratory infection that is caused by a virus. It is also known as coronavirus disease or novel  coronavirus. It can cause serious infections, such as pneumonia, acute respiratory distress syndrome, acute respiratory failure, or sepsis.  The virus that causes COVID-19 is contagious. This means that it can  spread from person to person through droplets from breathing, speaking, singing, coughing, or sneezing.  You are more likely to develop a serious illness if you are 54 years of age or older, have a weak immune system, live in a nursing home, or have chronic disease.  There is no medicine to treat COVID-19. Your health care provider will talk with you about ways to treat your symptoms.  Take steps to protect yourself and others from infection. Wash your hands often and disinfect objects and surfaces that are frequently touched every day. Stay away from people who are sick and wear a mask if you are sick. This information is not intended to replace advice given to you by your health care provider. Make sure you discuss any questions you have with your health care provider. Document Revised: 08/16/2019 Document Reviewed: 11/22/2018 Elsevier Patient Education  [place].

## 2020-08-05 NOTE — Progress Notes (Signed)
I connected by phone with Matthew [name] on 08/05/2020 at 4:23 PM to discuss the potential use of a new treatment for mild to moderate COVID-19 viral infection in non-hospitalized patients.  This patient is a 74 y.o. male that meets the FDA criteria for Emergency Use Authorization of COVID monoclonal antibody casirivimab/imdevimab or bamlanivimab/eteseviamb.  Has a (+) direct SARS-CoV-2 viral test result  Has mild or moderate COVID-19   Is NOT hospitalized due to COVID-19  Is within 10 days of symptom onset  Has at least one of the high risk factor(s) for progression to severe COVID-19 and/or hospitalization as defined in EUA.  Specific high risk criteria : Older age (>/= 74 yo), BMI > 25, Cardiovascular disease or hypertension and Chronic Lung Disease   I have spoken and communicated the following to the patient or parent/caregiver regarding COVID monoclonal antibody treatment:  1. FDA has authorized the emergency use for the treatment of mild to moderate COVID-19 in adults and pediatric patients with positive results of direct SARS-CoV-2 viral testing who are 78 years of age and older weighing at least 40 kg, and who are at high risk for progressing to severe COVID-19 and/or hospitalization.  2. The significant known and potential risks and benefits of COVID monoclonal antibody, and the extent to which such potential risks and benefits are unknown.  3. Information on available alternative treatments and the risks and benefits of those alternatives, including clinical trials.  4. Patients treated with COVID monoclonal antibody should continue to self-isolate and use infection control measures (e.g., wear mask, isolate, social distance, avoid sharing personal items, clean and disinfect "high touch" surfaces, and frequent handwashing) according to CDC guidelines.   5. The patient or parent/caregiver has the option to accept or refuse COVID monoclonal antibody treatment.  After reviewing  this information with the patient, the patient has agreed to receive one of the available covid 19 monoclonal antibodies and will be provided an appropriate fact sheet prior to infusion. [name], NP 08/05/2020 4:23 PM

## 2020-08-05 NOTE — Progress Notes (Signed)
Virtual telephone visit    Virtual Visit via Telephone Note   This visit type was conducted due to national recommendations for restrictions regarding the COVID-19 Pandemic (e.g. social distancing) in an effort to limit this patient's exposure and mitigate transmission in our community. Due to his co-morbid illnesses, this patient is at least at moderate risk for complications without adequate follow up. This format is felt to be most appropriate for this patient at this time. The patient did not have access to video technology or had technical difficulties with video requiring transitioning to audio format only (telephone). Physical exam was limited to content and character of the telephone converstion.    Patient location: at home  Provider location: Provider: Provider's office at  [facility], [place].     I discussed the limitations of evaluation and management by telemedicine and the availability of in person appointments. The patient expressed understanding and agreed to proceed.   Visit Date: 08/05/2020  Today's healthcare provider: Marcille Buffy, FNP   Chief Complaint  Patient presents with  . Covid Positive   Subjective    HPI  Patient states that he tested positive for Covid-19 yesterday, he states his spouse recently tested positive and just had antibody infusion and is inquiring if he will need it himself. He would like to proceed with MAB infusion he reports.    Onset of symptoms was  Monday 08/03/2020. Patient reports symptoms of cough, shortness of breath, fatigue, sweats, chills and low grade fever high of 99.0. patient denies symptoms of nausea, vomiting or diarrhea, he has been taking otc Tylenol to control his symptoms with little relief. Yellow mucous.   Mild shortness of breath, able to get up and walk in home without any difficulty.   Wife tested positive last week had MAB infusion.   He is wearing CPAP at home. He has  occasional wheezing that has been controlled with his maintenance inhalers.   Scheduled for Covid booster in November. He had Moderna vaccine almost 6 months ago- 2 dose series.  He does not have an Oxygen saturation at home. He does have a history of reactive airway disease, sleep apnea. Followed by Dr, Raul Del in pulmonary last visit 07/21/20.  He denies any distress.   Patient  denies any fever, body aches,chills, rash, chest pain, nausea, vomiting, or diarrhea.   Denies dizziness, lightheadedness, pre syncopal or syncopal episodes.   Patient Active Problem List   Diagnosis Date Noted  . 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 Arteriosclerosis of coronary artery 05/14/2015  . Allergic rhinitis 03/11/2015  . Airway hyperreactivity 03/11/2015  . Benign essential tremor 03/11/2015  . Benign neoplasm of colon 03/11/2015  . Failure of erection 03/11/2015  . Esophagitis, reflux 03/11/2015  . Essential (primary) hypertension 03/11/2015  . Gastro-esophageal reflux disease without esophagitis 03/11/2015  . HLD (hyperlipidemia) 03/11/2015  . Cannot sleep 03/11/2015  . Adult hypothyroidism 03/11/2015  . Chronic airway obstruction ([place]) 03/11/2015  . Acid reflux 05/07/2014  . Cough 09/06/2012  . Shortness of breath 09/04/2012  . OSA (obstructive sleep apnea) 09/04/2012   Past Medical History:  Diagnosis Date  . Allergic rhinitis, cause unspecified   . Allergy   . Arthritis   . Asthma   . Benign neoplasm of colon   . Clotting disorder ([place])    arterial bypass   . Collapsed lung   . COPD (chronic obstructive pulmonary disease) ([place])   . ED (erectile dysfunction)   . GERD (  gastroesophageal reflux disease)   . Hyperlipidemia   . Hypertension   . Internal hemorrhoids without mention of complication   . Left testicular cancer ([place]) 07/2014  . OSA (obstructive sleep apnea)    wears CPAP  . Sleep apnea    occasional CPAP  . Thyroid disease   . Unspecified  asthma(493.90)   . Unspecified sinusitis (chronic)    Past Surgical History:  Procedure Laterality Date  . COLONOSCOPY    . DENTAL SURGERY  12/2014  . INGUINAL HERNIA REPAIR    . NOSE SURGERY     submucous surgery  . POLYPECTOMY    . SINUS SURGERY WITH INSTATRAK    . SUBCLAVIAN BYPASS GRAFT  2009  . TESTICLE REMOVAL     Social History   Tobacco Use  . Smoking status: Never Smoker  . Smokeless tobacco: Never Used  Vaping Use  . Vaping Use: Never used  Substance Use Topics  . Alcohol use: Yes    Alcohol/week: 7.0 - 10.0 standard drinks    Types: 7 - 10 Shots of liquor per week    Comment: mixed drinks- 1/2 a day  . Drug use: No   Social History   Socioeconomic History  . Marital status: Married    Spouse name: Not on file  . Number of children: 2  . Years of education: Not on file  . Highest education level: 12th grade  Occupational History  . Occupation: retired    Comment: Architect  Tobacco Use  . Smoking status: Never Smoker  . Smokeless tobacco: Never Used  Vaping Use  . Vaping Use: Never used  Substance and Sexual Activity  . Alcohol use: Yes    Alcohol/week: 7.0 - 10.0 standard drinks    Types: 7 - 10 Shots of liquor per week    Comment: mixed drinks- 1/2 a day  . Drug use: No  . Sexual activity: Yes    Birth control/protection: None  Other Topics Concern  . Not on file  Social History Narrative  . Not on file   Social Determinants of Health   Financial Resource Strain: Low Risk   . Difficulty of Paying Living Expenses: Not hard at all  Food Insecurity: No Food Insecurity  . Worried About Charity fundraiser in the Last Year: Never true  . Ran Out of Food in the Last Year: Never true  Transportation Needs: No Transportation Needs  . Lack of Transportation (Medical): No  . Lack of Transportation (Non-Medical): No  Physical Activity: Inactive  . Days of Exercise per Week: 0 days  . Minutes of Exercise per Session: 0 min  Stress: No Stress  Concern Present  . Feeling of Stress : Not at all  Social Connections: Moderately Integrated  . Frequency of Communication with Friends and Family: More than three times a week  . Frequency of Social Gatherings with Friends and Family: More than three times a week  . Attends Religious Services: More than 4 times per year  . Active Member of Clubs or Organizations: No  . Attends Archivist Meetings: Never  . Marital Status: Married  Human resources officer Violence: Not At Risk  . Fear of Current or Ex-Partner: No  . Emotionally Abused: No  . Physically Abused: No  . Sexually Abused: No   Family Status  Relation Name Status  . Sister  Alive  . Sister  Deceased  . Sister  Alive  . Father  Deceased  . Mother  Deceased  .  Sister  Alive  . Brother  Deceased  . Sister  Alive  . Neg Hx  (Not Specified)   Family History  Problem Relation Age of Onset  . Colon cancer Sister 36  . Breast cancer Sister   . Heart attack Sister   . Heart attack Sister        deceased from MI  . Allergies Father   . Heart attack Father   . CAD Father        deceased 72 from fall and possible subdural hematoma  . Congestive Heart Failure Mother   . Emphysema Mother        deceased 75 with emphysema, tobacco abuse  . Asthma Sister   . CAD Brother   . Stroke Brother   . Lung cancer Brother   . Arthritis Sister   . Esophageal cancer Neg Hx   . Stomach cancer Neg Hx   . Rectal cancer Neg Hx   . Colon polyps Neg Hx    No Known Allergies    Medications: Outpatient Medications Prior to Visit  Medication Sig  . aspirin 81 MG tablet Take 81 mg by mouth daily.  Marland Kitchen atorvastatin (LIPITOR) 20 MG tablet TAKE 1 TABLET BY MOUTH EVERY DAY  . fluticasone (FLONASE) 50 MCG/ACT nasal spray Place 2 sprays into both nostrils daily. (Patient taking differently: Place 2 sprays into both nostrils daily. As needed)  . folic acid (FOLVITE) 160 MCG tablet Take 400 mcg by mouth daily.  Marland Kitchen ibuprofen (ADVIL,MOTRIN)  200 MG tablet Take 200 mg by mouth every 4 (four) hours as needed.   . levalbuterol (XOPENEX HFA) 45 MCG/ACT inhaler Inhale 1-2 puffs into the lungs every 6 (six) hours as needed for wheezing.  Marland Kitchen levothyroxine (SYNTHROID) 50 MCG tablet TAKE 1 TABLET BY MOUTH EVERY DAY  . magnesium oxide (MAG-OX) 400 MG tablet Take 400 mg by mouth 2 (two) times daily.  . meloxicam (MOBIC) 15 MG tablet Take 15 mg by mouth daily.  . meloxicam (MOBIC) 7.5 MG tablet Take 7.5 mg by mouth 2 (two) times daily.  . Multiple Vitamins-Minerals (CENTRUM SILVER ADULT 50+ PO) Take 1 tablet by mouth daily.  . Naproxen Sodium (ALEVE) 220 MG CAPS as needed.  . Oxymetazoline HCl (AFRIN NASAL SPRAY NA) Place into the nose as needed.    Marland Kitchen Phenylephrine-APAP-Guaifenesin (MUCINEX SINUS-MAX CONGESTION) 5-325-200 MG TABS Per bottle directions as needed  . Potassium 75 MG TABS Take 99 mg by mouth daily.   . sildenafil (REVATIO) 20 MG tablet Take 2 to 4 tablets daily as needed  . SYMBICORT 160-4.5 MCG/ACT inhaler INHALE 1-2 PUFFS TWICE DAILY   No facility-administered medications prior to visit.    Review of Systems  Constitutional: Positive for fatigue and fever. Negative for activity change, appetite change, chills, diaphoresis and unexpected weight change.  Respiratory: Positive for cough and shortness of breath. Negative for apnea, choking, chest tightness, wheezing and stridor.   Cardiovascular: Negative.   Gastrointestinal: Negative.   Endocrine: Negative.   Genitourinary: Negative.   Musculoskeletal: Positive for myalgias. Negative for arthralgias, back pain, gait problem, joint swelling, neck pain and neck stiffness.  Skin: Negative.   Neurological: Negative.   Hematological: Negative.   Psychiatric/Behavioral: Negative.     Last CBC Lab Results  Component Value Date   WBC 5.7 06/16/2020   HGB 15.0 06/16/2020   HCT 43.7 06/16/2020   MCV 94 06/16/2020   MCH 32.3 06/16/2020   RDW 12.9 06/16/2020   PLT 246  13/24/4010   Last metabolic panel Lab Results  Component Value Date   GLUCOSE 98 06/16/2020   NA 137 06/16/2020   K 4.3 06/16/2020   CL 100 06/16/2020   CO2 25 06/16/2020   BUN 18 06/16/2020   CREATININE 0.85 06/16/2020   GFRNONAA 86 06/16/2020   GFRAA 99 06/16/2020   CALCIUM 9.5 06/16/2020   PROT 6.6 06/16/2020   ALBUMIN 4.4 06/16/2020   LABGLOB 2.2 06/16/2020   AGRATIO 2.0 06/16/2020   BILITOT 0.6 06/16/2020   ALKPHOS 59 06/16/2020   AST 17 06/16/2020   ALT 23 06/16/2020   ANIONGAP 8 06/26/2014   Last lipids Lab Results  Component Value Date   CHOL 192 06/16/2020   HDL 43 06/16/2020   LDLCALC 116 (H) 06/16/2020   TRIG 186 (H) 06/16/2020   CHOLHDL 4.5 06/16/2020   Last hemoglobin A1c No results found for: HGBA1C Last thyroid functions Lab Results  Component Value Date   TSH 5.220 (H) 06/16/2020   Last vitamin D No results found for: 25OHVITD2, 25OHVITD3, VD25OH Last vitamin B12 and Folate No results found for: VITAMINB12, FOLATE    Objective    There were no vitals taken for this visit. BP Readings from Last 3 Encounters:  06/15/20 110/72  06/15/20 110/72  12/25/19 (!) 141/84    Phone visit only patient did not have video capabilities.   Patient is alert and oriented and responsive to questions Engages in conversation with provider. Speaks in full sentences without any pauses without any shortness of breath or distress.    Assessment & Plan      COVID-19  Lab test positive for detection of COVID-19 virus    rest and hydration advised. Ok to take over the counter zinc, vitamin D and C.  Reports wheezing is mild and controlled with his inhalers, he denies need for refills.    Can send in steroid or nebulizer if he needs, he will call back but he is doing ok now he reports.   Referred to the MAB infusion clinic due to symptom onset on 08/03/2020 and over age 59.  08/05/2020 8:40am by instant message and Mauricio Po FNP-C responded as follows.  Perfect. Thanks. Will get him added to the list  and advised patient to call back this afternoon  Discussed symptom management, guaifenesin, monitoring Oxygen saturation and paremeters for readings if able, discussed when to call back and when to seek care in person.    Red Flags discussed. The patient was given clear instructions to go to ER or return to medical center if any red flags develop, symptoms do not improve, worsen or new problems develop. They verbalized understanding.   Return if symptoms worsen or fail to improve, for at any time for any worsening symptoms, Go to Emergency room/ urgent care if worse.    I discussed the assessment and treatment plan with the patient. The patient was provided an opportunity to ask questions and all were answered. The patient agreed with the plan and demonstrated an understanding of the instructions.   The patient was advised to call back or seek an in-person evaluation if the symptoms worsen or if the condition fails to improve as anticipated.  I provided 20  minutes of non-face-to-face time during this encounter.  {I discussed the limitations of evaluation and management by telemedicine and the availability of in person appointments. The patient expressed understanding and agreed to proceed.   Marcille Buffy, [place] [phone] (phone) [phone] (fax)   Medical Group  

## 2020-08-06 ENCOUNTER — Ambulatory Visit (HOSPITAL_COMMUNITY)
Admission: RE | Admit: 2020-08-06 | Discharge: 2020-08-06 | Disposition: A | Discharge status: Home / Self Care | Payer: Medicare Other | Source: Ambulatory Visit | Attending: Pulmonary Disease | Admitting: Pulmonary Disease

## 2020-08-06 DIAGNOSIS — R059 Cough, unspecified: Secondary | ICD-10-CM | POA: Insufficient documentation

## 2020-08-06 DIAGNOSIS — Z23 Encounter for immunization: Secondary | ICD-10-CM | POA: Diagnosis not present

## 2020-08-06 DIAGNOSIS — R0602 Shortness of breath: Secondary | ICD-10-CM | POA: Diagnosis present

## 2020-08-06 DIAGNOSIS — G4733 Obstructive sleep apnea (adult) (pediatric): Secondary | ICD-10-CM | POA: Diagnosis present

## 2020-08-06 DIAGNOSIS — C6292 Malignant neoplasm of left testis, unspecified whether descended or undescended: Secondary | ICD-10-CM | POA: Diagnosis present

## 2020-08-06 DIAGNOSIS — J4520 Mild intermittent asthma, uncomplicated: Secondary | ICD-10-CM

## 2020-08-06 DIAGNOSIS — E785 Hyperlipidemia, unspecified: Secondary | ICD-10-CM | POA: Diagnosis present

## 2020-08-06 DIAGNOSIS — J449 Chronic obstructive pulmonary disease, unspecified: Secondary | ICD-10-CM | POA: Diagnosis present

## 2020-08-06 DIAGNOSIS — U071 COVID-19: Secondary | ICD-10-CM | POA: Insufficient documentation

## 2020-08-06 DIAGNOSIS — E039 Hypothyroidism, unspecified: Secondary | ICD-10-CM | POA: Diagnosis present

## 2020-08-06 DIAGNOSIS — I10 Essential (primary) hypertension: Secondary | ICD-10-CM | POA: Diagnosis present

## 2020-08-06 DIAGNOSIS — I2510 Atherosclerotic heart disease of native coronary artery without angina pectoris: Secondary | ICD-10-CM

## 2020-08-06 MED ORDER — ALBUTEROL SULFATE HFA 108 (90 BASE) MCG/ACT IN AERS: 2.0000 | INHALATION_SPRAY | Freq: Once | RESPIRATORY_TRACT | Status: DC | PRN

## 2020-08-06 MED ORDER — SODIUM CHLORIDE 0.9 % IV SOLN
1200.0000 mg | Freq: Once | INTRAVENOUS | Status: DC
  Administered 2020-08-06: 1200 mg via INTRAVENOUS

## 2020-08-06 MED ORDER — EPINEPHRINE 0.3 MG/0.3ML IJ SOAJ: 0.3000 mg | Freq: Once | INTRAMUSCULAR | Status: DC | PRN

## 2020-08-06 MED ORDER — SODIUM CHLORIDE 0.9 % IV SOLN: INTRAVENOUS | Status: DC | PRN

## 2020-08-06 MED ORDER — DIPHENHYDRAMINE HCL 50 MG/ML IJ SOLN: 50.0000 mg | Freq: Once | INTRAMUSCULAR | Status: DC | PRN

## 2020-08-06 MED ORDER — METHYLPREDNISOLONE SODIUM SUCC 125 MG IJ SOLR: 125.0000 mg | Freq: Once | INTRAMUSCULAR | Status: DC | PRN

## 2020-08-06 MED ORDER — SODIUM CHLORIDE 0.9 % IV SOLN: Freq: Once | INTRAVENOUS | Status: DC

## 2020-08-06 MED ORDER — FAMOTIDINE IN NACL 20-0.9 MG/50ML-% IV SOLN: 20.0000 mg | Freq: Once | INTRAVENOUS | Status: DC | PRN

## 2020-08-06 NOTE — Progress Notes (Signed)
  Diagnosis: COVID-19  Physician: Dr. Wright  Procedure: Covid Infusion Clinic Med: casirivimab\imdevimab infusion - Provided patient with casirivimab\imdevimab fact sheet for patients, parents and caregivers prior to infusion.  Complications: No immediate complications noted.  Discharge: Discharged home   Brayton Baumgartner M Sherene Plancarte 08/06/2020  

## 2020-08-06 NOTE — Discharge Instructions (Signed)

## 2020-09-08 DIAGNOSIS — Z79899 Other long term (current) drug therapy: Secondary | ICD-10-CM | POA: Diagnosis not present

## 2020-09-19 ENCOUNTER — Other Ambulatory Visit: Payer: Self-pay | Admitting: Family Medicine

## 2020-09-19 DIAGNOSIS — R062 Wheezing: Secondary | ICD-10-CM

## 2020-09-19 DIAGNOSIS — R059 Cough, unspecified: Secondary | ICD-10-CM

## 2020-09-19 NOTE — Telephone Encounter (Signed)
Requested medication (s) are due for refill today: no  Requested medication (s) are on the active medication list: no  Last refill:  Discontinued on 06/15/20  Future visit scheduled: yes  Notes to clinic:  Please review for refill. Medication not on current med list    Requested Prescriptions  Pending Prescriptions Disp Refills   albuterol (VENTOLIN HFA) 108 (90 Base) MCG/ACT inhaler [Pharmacy Med Name: ALBUTEROL HFA (VENTOLIN) INH] 18 each 11    Sig: TAKE 2 PUFFS BY MOUTH EVERY 6 HOURS AS NEEDED FOR WHEEZE OR SHORTNESS OF BREATH      Pulmonology:  Beta Agonists Failed - 09/19/2020  9:10 AM      Failed - One inhaler should last at least one month. If the patient is requesting refills earlier, contact the patient to check for uncontrolled symptoms.      Passed - Valid encounter within last 12 months    Recent Outpatient Visits           1 month ago [place], Kelby Aline, FNP   3 months ago Annual physical exam   [facility] Jerrol Banana., MD   1 year ago Annual physical exam   [facility] Jerrol Banana., MD   2 years ago Hemorrhoids, unspecified hemorrhoid type   [facility] Jerrol Banana., MD   2 years ago Cough   [facility] Birdie Sons, MD       Future Appointments             In 2 months Jerrol Banana., MD [facility]   In 9 months Jerrol Banana., MD [facility]             Signed Prescriptions Disp Refills   levothyroxine (SYNTHROID) 50 MCG tablet 90 tablet 0    Sig: TAKE 1 TABLET BY MOUTH EVERY DAY      Endocrinology:  Hypothyroid Agents Failed - 09/19/2020  9:10 AM      Failed - TSH needs to be rechecked within 3 months after an abnormal result. Refill until TSH is due.      Failed - TSH in normal range and within 360 days    TSH  Date Value Ref Range Status  06/16/2020  5.220 (H) 0.450 - 4.500 uIU/mL Final          Passed - Valid encounter within last 12 months    Recent Outpatient Visits           1 month ago [place], FNP   3 months ago Annual physical exam   [facility] Jerrol Banana., MD   1 year ago Annual physical exam   [facility] Jerrol Banana., MD   2 years ago Hemorrhoids, unspecified hemorrhoid type   [facility] Jerrol Banana., MD   2 years ago Cough   [facility] Birdie Sons, MD       Future Appointments             In 2 months Jerrol Banana., MD [facility], [place]   In 9 months Jerrol Banana., MD [facility], [place]

## 2020-09-21 NOTE — Telephone Encounter (Signed)
Please advise 

## 2020-10-15 DIAGNOSIS — M7062 Trochanteric bursitis, left hip: Secondary | ICD-10-CM | POA: Diagnosis not present

## 2020-10-26 DIAGNOSIS — Z79899 Other long term (current) drug therapy: Secondary | ICD-10-CM | POA: Diagnosis not present

## 2020-10-28 ENCOUNTER — Telehealth: Payer: Self-pay | Admitting: *Deleted

## 2020-10-28 NOTE — Chronic Care Management (AMB) (Signed)
  Chronic Care Management   Note  10/28/2020 Name: Matthew [name] MRN: [id number] DOB: [birth year]  Matthew [name] is a 74 y.o. year old male who is a primary care patient of Jerrol Banana., MD. I reached out to Wenda Low by phone today in response to a referral sent by [name] health plan.     [name] was given information about Chronic Care Management services today including:  1. CCM service includes personalized support from designated clinical staff supervised by his physician, including individualized plan of care and coordination with other care providers 2. 24/7 contact phone numbers for assistance for urgent and routine care needs. 3. Service will only be billed when office clinical staff spend 20 minutes or more in a month to coordinate care. 4. Only one practitioner may furnish and bill the service in a calendar month. 5. The patient may stop CCM services at any time (effective at the end of the month) by phone call to the office staff. 6. The patient will be responsible for cost sharing (co-pay) of up to 20% of the service fee (after annual deductible is met).  Patient agreed to services and verbal consent obtained.   Follow up plan: Telephone appointment with care management team member scheduled for: 11/24/2020  [place] Management  Direct Dial: [phone]

## 2020-11-24 ENCOUNTER — Telehealth: Payer: Self-pay

## 2020-11-24 ENCOUNTER — Telehealth: Payer: Medicare Other

## 2020-11-24 NOTE — Telephone Encounter (Signed)
  Chronic Care Management   Outreach Note  11/24/2020 Name: Matthew [name] MRN: [id number] DOB: [birth year]  Primary Care Provider: Jerrol Banana., MD Reason for referral : Chronic Care Management   An unsuccessful telephone outreach was attempted today. [name] was referred to the case management team for assistance with care management and care coordination.     Follow Up Plan:  A member of the care management team will reach out to [name] again within the next two weeks.    [facility] [phone]

## 2020-12-10 ENCOUNTER — Telehealth: Payer: Self-pay

## 2020-12-10 NOTE — Telephone Encounter (Signed)
  Chronic Care Management   Outreach Note  12/10/2020 Name: Matthew [name] MRN: [id number] DOB: 1946/06/17  Primary Care Provider: Jerrol Banana., MD Reason for referral : Chronic Care Management   A second unsuccessful telephone outreach was attempted today. Matthew [name] was referred to the case management team for assistance with care management and care coordination.     Follow Up Plan:  A HIPAA compliant voice message was left today requesting a return call.    [facility] Care Management [facility] [phone]

## 2020-12-15 ENCOUNTER — Other Ambulatory Visit: Payer: Self-pay | Admitting: Family Medicine

## 2020-12-16 ENCOUNTER — Ambulatory Visit: Payer: Self-pay | Admitting: Family Medicine

## 2020-12-22 ENCOUNTER — Ambulatory Visit: Payer: Self-pay

## 2020-12-22 NOTE — Chronic Care Management (AMB) (Signed)
  Chronic Care Management   Outreach Note  12/22/2020 Name: STELLA ENCARNACION MRN: [id number] DOB: 05/15/1946  Primary Care Provider: Jerrol Banana., MD Reason for referral : Chronic Care Management   Mr. Cuff was referred to the care management team for assistance with chronic care management and care coordination. His primary care provider will be notified of our unsuccessful attempts to maintain contact. The care management team will gladly outreach at any time in the future if he is interested in receiving assistance.   PLAN The care management team will gladly follow up with Mr. Ivey after the primary care provider has a conversation with him regarding recommendation for care management engagement and subsequent re-referral for care management services.   [facility] Care Management [facility] [phone]

## 2020-12-31 ENCOUNTER — Ambulatory Visit: Payer: Self-pay | Admitting: *Deleted

## 2020-12-31 ENCOUNTER — Emergency Department
Admission: EM | Admit: 2020-12-31 | Discharge: 2020-12-31 | Disposition: A | Discharge status: Home / Self Care | Payer: Medicare Other | Attending: Emergency Medicine | Admitting: Emergency Medicine

## 2020-12-31 ENCOUNTER — Encounter: Payer: Self-pay | Admitting: Emergency Medicine

## 2020-12-31 ENCOUNTER — Other Ambulatory Visit: Payer: Self-pay

## 2020-12-31 ENCOUNTER — Emergency Department: Payer: Medicare Other

## 2020-12-31 DIAGNOSIS — R609 Edema, unspecified: Secondary | ICD-10-CM

## 2020-12-31 DIAGNOSIS — M7989 Other specified soft tissue disorders: Secondary | ICD-10-CM | POA: Diagnosis present

## 2020-12-31 DIAGNOSIS — I517 Cardiomegaly: Secondary | ICD-10-CM | POA: Diagnosis not present

## 2020-12-31 DIAGNOSIS — R0601 Orthopnea: Secondary | ICD-10-CM | POA: Insufficient documentation

## 2020-12-31 DIAGNOSIS — I110 Hypertensive heart disease with heart failure: Secondary | ICD-10-CM | POA: Diagnosis not present

## 2020-12-31 DIAGNOSIS — I509 Heart failure, unspecified: Secondary | ICD-10-CM | POA: Insufficient documentation

## 2020-12-31 DIAGNOSIS — Z8547 Personal history of malignant neoplasm of testis: Secondary | ICD-10-CM | POA: Insufficient documentation

## 2020-12-31 DIAGNOSIS — Z7982 Long term (current) use of aspirin: Secondary | ICD-10-CM | POA: Insufficient documentation

## 2020-12-31 DIAGNOSIS — Z79899 Other long term (current) drug therapy: Secondary | ICD-10-CM | POA: Diagnosis not present

## 2020-12-31 DIAGNOSIS — E039 Hypothyroidism, unspecified: Secondary | ICD-10-CM | POA: Insufficient documentation

## 2020-12-31 DIAGNOSIS — Z85038 Personal history of other malignant neoplasm of large intestine: Secondary | ICD-10-CM | POA: Insufficient documentation

## 2020-12-31 DIAGNOSIS — J449 Chronic obstructive pulmonary disease, unspecified: Secondary | ICD-10-CM | POA: Diagnosis not present

## 2020-12-31 DIAGNOSIS — J811 Chronic pulmonary edema: Secondary | ICD-10-CM | POA: Diagnosis not present

## 2020-12-31 DIAGNOSIS — R0602 Shortness of breath: Secondary | ICD-10-CM | POA: Diagnosis not present

## 2020-12-31 LAB — COMPREHENSIVE METABOLIC PANEL
ALT: 27 U/L (ref 0–44)
AST: 29 U/L (ref 15–41)
Albumin: 4 g/dL (ref 3.5–5.0)
Alkaline Phosphatase: 42 U/L (ref 38–126)
Anion gap: 7 (ref 5–15)
BUN: 24 mg/dL — ABNORMAL HIGH (ref 8–23)
CO2: 25 mmol/L (ref 22–32)
Calcium: 9 mg/dL (ref 8.9–10.3)
Chloride: 105 mmol/L (ref 98–111)
Creatinine, Ser: 0.86 mg/dL (ref 0.61–1.24)
GFR, Estimated: >60 mL/min (ref >60)
Glucose, Bld: 120 mg/dL — ABNORMAL HIGH (ref 70–99)
Potassium: 4.1 mmol/L (ref 3.5–5.1)
Sodium: 137 mmol/L (ref 135–145)
Total Bilirubin: 0.9 mg/dL (ref 0.3–1.2)
Total Protein: 7 g/dL (ref 6.5–8.1)

## 2020-12-31 LAB — CBC
HCT: 44.2 % (ref 39.0–52.0)
Hemoglobin: 14.8 g/dL (ref 13.0–17.0)
MCH: 32.1 pg (ref 26.0–34.0)
MCHC: 33.5 g/dL (ref 30.0–36.0)
MCV: 95.9 fL (ref 80.0–100.0)
Platelets: 167 10*3/uL (ref 150–400)
RBC: 4.61 MIL/uL (ref 4.22–5.81)
RDW: 13.3 % (ref 11.5–15.5)
WBC: 6.9 10*3/uL (ref 4.0–10.5)
nRBC: 0 % (ref 0.0–0.2)

## 2020-12-31 LAB — URINALYSIS, COMPLETE (UACMP) WITH MICROSCOPIC
Bacteria, UA: NONE SEEN
Bilirubin Urine: NEGATIVE
Glucose, UA: NEGATIVE mg/dL
Hgb urine dipstick: NEGATIVE
Ketones, ur: NEGATIVE mg/dL
Leukocytes,Ua: NEGATIVE
Nitrite: NEGATIVE
Protein, ur: NEGATIVE mg/dL
Specific Gravity, Urine: 1.025 (ref 1.005–1.030)
Squamous Epithelial / HPF: NONE SEEN (ref 0–5)
pH: 6 (ref 5.0–8.0)

## 2020-12-31 LAB — BRAIN NATRIURETIC PEPTIDE: B Natriuretic Peptide: 74.2 pg/mL (ref 0.0–100.0)

## 2020-12-31 MED ORDER — FUROSEMIDE 40 MG PO TABS
20.0000 mg | ORAL_TABLET | Freq: Once | ORAL | Status: AC
  Administered 2020-12-31: 20 mg via ORAL
  Filled 2020-12-31: qty 1

## 2020-12-31 MED ORDER — FUROSEMIDE 20 MG PO TABS: 20.0000 mg | ORAL_TABLET | Freq: Every day | ORAL | 0 refills | Status: DC

## 2020-12-31 NOTE — Discharge Instructions (Addendum)
As we discussed, your symptoms are highly concerning for new onset heart failure.  You are being discharged with a prescription for a small dose of Lasix/furosemide fluid pill to take one time daily moving forward to help with your symptoms.    Please ensure you follow-up with your PCP on Tuesday, as we discussed, for possible cardiology referral and definitely for an echocardiogram.  If you develop any worsening symptoms despite these medications, or passing out, fevers with your symptoms, please return to the ED.

## 2020-12-31 NOTE — ED Triage Notes (Signed)
Pt with c/o bilateral leg swelling, states this has never happened before. Pt states it does not improve with elevation. Pt states he is not on lasix, pt states increased SOB with exertion and just sitting with the swelling pt with hx of COPD. Pt states he also has tremors that have gotten worse in the last week as well.

## 2020-12-31 NOTE — Telephone Encounter (Signed)
Summary: advice   Pt has an appt on 3/8 with Dr Rosanna Randy but had some concerns since his feet are swelling and he is not sure why. Pt wanted some advice.      Patient is calling to report he is having bilateral swelling in both feet and ankles. Patient states his fatigue is worse and he gets more winded with exertion. Patient states he doesn't notice more swelliing in other parts of the body- but feels pressure and feels like he is "bloated" in trunk. Advised patient per protocol - ED for evaluation of symptoms.  Reason for Disposition . [1] Difficulty breathing with exertion (e.g., walking) AND [2] new-onset or worsening  Answer Assessment - Initial Assessment Questions 1. LOCATION: "Which ankle is swollen?" "Where is the swelling?"     Both feet and ankles 2. ONSET: "When did the swelling start?"     1 week 3. SIZE: "How large is the swelling?"     Couldn't wear regular shoes 4. PAIN: "Is there any pain?" If Yes, ask: "How bad is it?" (Scale 1-10; or mild, moderate, severe)   - NONE (0): no pain.   - MILD (1-3): doesn't interfere with normal activities.    - MODERATE (4-7): interferes with normal activities (e.g., work or school) or awakens from sleep, limping.    - SEVERE (8-10): excruciating pain, unable to do any normal activities, unable to walk.      Stretch pain on bottom of feet- mild/moderate 5. CAUSE: "What do you think caused the ankle swelling?"     Felt bloated today 6. OTHER SYMPTOMS: "Do you have any other symptoms?" (e.g., fever, chest pain, difficulty breathing, calf pain)     Fatigue, hard to breath- especially with exertion 7. PREGNANCY: "Is there any chance you are pregnant?" "When was your last menstrual period?"     n/a  Protocols used: LEG SWELLING AND EDEMA-A-AH, ANKLE SWELLING-A-AH

## 2020-12-31 NOTE — ED Provider Notes (Signed)
[facility] Emergency Department Provider Note ____________________________________________   Event Date/Time   First MD Initiated Contact with Patient 12/31/20 1747     (approximate)  I have reviewed the triage vital signs and the nursing notes.  HISTORY  Chief Complaint Leg Swelling   HPI Matthew [name] is a 75 y.o. malewho presents to the ED for evaluation of bilateral leg swelling.  Chart review indicates history of COPD, HTN, HLD.  Mild mitral and tricuspid valve insufficiency noted in 2013 echo.  Patient presents to the ED with his wife, who provides additional history, for evaluation of 1-2 weeks of progressively worsening bilateral lower extremity swelling, orthopnea, dyspnea on exertion and bloating sensation.  He denies any chest pain, syncope, fever, trauma.  He reports this is never happened before.  He reports orthopnea such that he has had to raise the head of his bed at night to feel better.  He reports occasional cough of thin frothy sputum.  Reports tolerating p.o. intake and toileting at his baseline.  Denies any falls or gait changes, but does report a sensation of generalized weakness accompanying his 2 weeks of respiratory symptoms.  Past Medical History:  Diagnosis Date  . Allergic rhinitis, cause unspecified   . Allergy   . Arthritis   . Asthma   . Benign neoplasm of colon   . Clotting disorder ([place])    arterial bypass   . Collapsed lung   . COPD (chronic obstructive pulmonary disease) ([place])   . ED (erectile dysfunction)   . GERD (gastroesophageal reflux disease)   . Hyperlipidemia   . Hypertension   . Internal hemorrhoids without mention of complication   . Left testicular cancer ([place]) 07/2014  . OSA (obstructive sleep apnea)    wears CPAP  . Sleep apnea    occasional CPAP  . Thyroid disease   . Unspecified asthma(493.90)   . Unspecified sinusitis (chronic)     Patient Active Problem List   Diagnosis Date Noted  .  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 Arteriosclerosis of coronary artery 05/14/2015  . Allergic rhinitis 03/11/2015  . Airway hyperreactivity 03/11/2015  . Benign essential tremor 03/11/2015  . Benign neoplasm of colon 03/11/2015  . Failure of erection 03/11/2015  . Esophagitis, reflux 03/11/2015  . Essential (primary) hypertension 03/11/2015  . Gastro-esophageal reflux disease without esophagitis 03/11/2015  . HLD (hyperlipidemia) 03/11/2015  . Cannot sleep 03/11/2015  . Adult hypothyroidism 03/11/2015  . Chronic airway obstruction ([place]) 03/11/2015  . Acid reflux 05/07/2014  . Cough 09/06/2012  . Shortness of breath 09/04/2012  . OSA (obstructive sleep apnea) 09/04/2012    Past Surgical History:  Procedure Laterality Date  . COLONOSCOPY    . DENTAL SURGERY  12/2014  . INGUINAL HERNIA REPAIR    . NOSE SURGERY     submucous surgery  . POLYPECTOMY    . SINUS SURGERY WITH INSTATRAK    . SUBCLAVIAN BYPASS GRAFT  2009  . TESTICLE REMOVAL      Prior to Admission medications   Medication Sig Start Date End Date Taking? Authorizing Provider  furosemide (LASIX) 20 MG tablet Take 1 tablet (20 mg total) by mouth daily. 12/31/20 01/30/21 Yes Vladimir Crofts, MD  albuterol (VENTOLIN HFA) 108 (90 Base) MCG/ACT inhaler TAKE 2 PUFFS BY MOUTH EVERY 6 HOURS AS NEEDED FOR WHEEZE OR SHORTNESS OF BREATH 09/21/20   Jerrol Banana., MD  aspirin 81 MG tablet Take 81 mg by mouth daily.  [provider]  atorvastatin (LIPITOR) 20 MG tablet TAKE 1 TABLET BY MOUTH EVERY DAY 07/03/20   Jerrol Banana., MD  fluticasone [facility]) 50 MCG/ACT nasal spray Place 2 sprays into both nostrils daily. Patient taking differently: Place 2 sprays into both nostrils daily. As needed 03/02/16   Jerrol Banana., MD  folic acid (FOLVITE) 409 MCG tablet Take 400 mcg by mouth daily.    [provider]  ibuprofen (ADVIL,MOTRIN) 200 MG tablet Take 200 mg by mouth every 4 (four)  hours as needed.     [provider]  levalbuterol Penne Lash HFA) 45 MCG/ACT inhaler Inhale 1-2 puffs into the lungs every 6 (six) hours as needed for wheezing. 06/13/19   Jerrol Banana., MD  levothyroxine (SYNTHROID) 50 MCG tablet TAKE 1 TABLET BY MOUTH EVERY DAY 12/15/20   Jerrol Banana., MD  magnesium oxide (MAG-OX) 400 MG tablet Take 400 mg by mouth 2 (two) times daily.    [provider]  meloxicam (MOBIC) 15 MG tablet Take 15 mg by mouth daily.    [provider]  meloxicam (MOBIC) 7.5 MG tablet Take 7.5 mg by mouth 2 (two) times daily. 04/04/19   [provider]  Multiple Vitamins-Minerals (CENTRUM SILVER ADULT 50+ PO) Take 1 tablet by mouth daily.    [provider]  Naproxen Sodium (ALEVE) 220 MG CAPS as needed.    [provider]  Oxymetazoline HCl (AFRIN NASAL SPRAY NA) Place into the nose as needed.      [provider]  Phenylephrine-APAP-Guaifenesin (MUCINEX SINUS-MAX CONGESTION) 5-325-200 MG TABS Per bottle directions as needed    [provider]  Potassium 75 MG TABS Take 99 mg by mouth daily.     [provider]  sildenafil (REVATIO) 20 MG tablet Take 2 to 4 tablets daily as needed 03/02/16   Jerrol Banana., MD  SYMBICORT 160-4.5 MCG/ACT inhaler INHALE 1-2 PUFFS TWICE DAILY 02/21/17   Jerrol Banana., MD    Allergies Patient has no known allergies.  Family History  Problem Relation Age of Onset  . Colon cancer Sister 87  . Breast cancer Sister   . Heart attack Sister   . Heart attack Sister        deceased from MI  . Allergies Father   . Heart attack Father   . CAD Father        deceased 20 from fall and possible subdural hematoma  . Congestive Heart Failure Mother   . Emphysema Mother        deceased 36 with emphysema, tobacco abuse  . Asthma Sister   . CAD Brother   . Stroke Brother   . Lung cancer Brother   . Arthritis Sister   . Esophageal cancer Neg Hx    . Stomach cancer Neg Hx   . Rectal cancer Neg Hx   . Colon polyps Neg Hx     Social History Social History   Tobacco Use  . Smoking status: Never Smoker  . Smokeless tobacco: Never Used  Vaping Use  . Vaping Use: Never used  Substance Use Topics  . Alcohol use: Yes    Alcohol/week: 7.0 - 10.0 standard drinks    Types: 7 - 10 Shots of liquor per week    Comment: mixed drinks- 1/2 a day  . Drug use: No    Review of Systems  Constitutional: No fever/chills Eyes: No visual changes. ENT: No sore throat.  Cardiovascular: Denies chest pain. Respiratory: Positive for orthopnea and shortness of breath. Gastrointestinal: No abdominal pain.  No nausea, no vomiting.  No diarrhea.  No constipation. Genitourinary: Negative for dysuria. Musculoskeletal: Negative for back pain. Skin: Negative for rash. Neurological: Negative for headaches, focal weakness or numbness. ____________________________________________  PHYSICAL EXAM:  VITAL SIGNS: Vitals:   12/31/20 1726  BP: (!) 159/91  Pulse: 63  Resp: (!) 22  Temp: 98.4 F (36.9 C)  SpO2: 96%    Constitutional: Alert and oriented. Well appearing and in no acute distress.  Standing and ambulatory with a normal gait without distress.  He is in a hallway bed, and likes to walk around and watch the ED.  He is conversational in full sentences when I talk with him. Eyes: Conjunctivae are normal. PERRL. EOMI. Head: Atraumatic. Nose: No congestion/rhinnorhea. Mouth/Throat: Mucous membranes are moist.  Oropharynx non-erythematous. Neck: No stridor. No cervical spine tenderness to palpation. Cardiovascular: Normal rate, regular rhythm. Grossly normal heart sounds.  Good peripheral circulation. Respiratory: Normal respiratory effort.  No retractions. Lungs CTAB. Gastrointestinal: Soft , nondistended, nontender to palpation. No CVA tenderness. Musculoskeletal:  No joint effusions. No signs of acute trauma. Symmetric swelling to bilateral  lower extremities without overlying skin changes or signs of trauma. Neurologic:  Normal speech and language. No gross focal neurologic deficits are appreciated. No gait instability noted. Skin:  Skin is warm, dry and intact. No rash noted. Psychiatric: Mood and affect are normal. Speech and behavior are normal. ____________________________________________   LABS (all labs ordered are listed, but only abnormal results are displayed)  Labs Reviewed  COMPREHENSIVE METABOLIC PANEL - Abnormal; Notable for the following components:      Result Value   Glucose, Bld 120 (*)    BUN 24 (*)    All other components within normal limits  URINALYSIS, COMPLETE (UACMP) WITH MICROSCOPIC - Abnormal; Notable for the following components:   Color, Urine YELLOW (*)    APPearance CLEAR (*)    All other components within normal limits  CBC  BRAIN NATRIURETIC PEPTIDE   ____________________________________________  12 Lead EKG  Sinus rhythm, rate of 60 bpm.  Left anterior fascicular and RBBB blocks, but intervals are otherwise normal.  No evidence of acute ischemia. ____________________________________________  RADIOLOGY  ED MD interpretation: 1 view CXR reviewed by me with mild pulmonary vascular congestion.  Official radiology report(s): DG Chest Portable 1 View  Result Date: 12/31/2020 CLINICAL DATA:  Shortness of breath EXAM: PORTABLE CHEST 1 VIEW COMPARISON:  December 18, 2019 FINDINGS: The heart size and mediastinal contours are mildly prominent. Aortic knob calcifications are seen. There is elevation of the right hemidiaphragm. There is prominence of the central pulmonary vasculature. No large airspace consolidation or pleural effusion. No acute osseous abnormality. IMPRESSION: Mild cardiomegaly and pulmonary vascular congestion. Electronically Signed   By: Prudencio Pair M.D.   On: 12/31/2020 18:11    ____________________________________________   PROCEDURES and INTERVENTIONS  Procedure(s)  performed (including Critical Care):  Procedures  Medications  furosemide (LASIX) tablet 20 mg (has no administration in time range)    ____________________________________________   MDM / ED COURSE   75 year old male with history of hypertension presents to the ED with subacute orthopnea and new onset CHF symptoms, amenable to outpatient management with close PCP follow-up.  Minimal hypertension, otherwise normal vital signs on room air.  Looks well in the ED, ambulating without acute distress, but does appear volume overloaded.  His blood work shows no acute derangements and urine is  without infectious features.  CXR demonstrates pulmonary vascular congestion without discrete infiltrate to suggest pneumonia.  His productive sputum, and other respiratory symptoms, is likely due to new onset CHF.  I discussed with him the possibility of medical observation admission to facilitate faster work-up, versus close outpatient follow-up with his PCP with a cardiology referral.  Patient reports already have an appointment scheduled with his PCP on Tuesday, 5 days from now, and prefers to follow-up as an outpatient.  We discussed initiating a course of Lasix as an outpatient, 20 mg daily, to start with diuresis and symptom control until he can get a repeat echo as an outpatient.  We thoroughly discussed return precautions for the ED and patient is medically stable for outpatient management.     ____________________________________________   FINAL CLINICAL IMPRESSION(S) / ED DIAGNOSES  Final diagnoses:  Peripheral edema  Orthopnea  New onset of congestive heart failure [facility])     ED Discharge Orders         Ordered    furosemide (LASIX) 20 MG tablet  Daily        12/31/20 1842           Keats Kingry Tamala Julian   Note:  This document was prepared using Dragon voice recognition software and may include unintentional dictation errors.   Vladimir Crofts, MD 12/31/20 [phone]

## 2021-01-05 ENCOUNTER — Ambulatory Visit (INDEPENDENT_AMBULATORY_CARE_PROVIDER_SITE_OTHER): Payer: Medicare Other | Admitting: Family Medicine

## 2021-01-05 ENCOUNTER — Other Ambulatory Visit: Payer: Self-pay

## 2021-01-05 ENCOUNTER — Encounter: Payer: Self-pay | Admitting: Family Medicine

## 2021-01-05 VITALS — BP 162/84 | HR 79 | Temp 98.6°F | Resp 16 | Wt 249.0 lb

## 2021-01-05 DIAGNOSIS — I2510 Atherosclerotic heart disease of native coronary artery without angina pectoris: Secondary | ICD-10-CM

## 2021-01-05 DIAGNOSIS — G4733 Obstructive sleep apnea (adult) (pediatric): Secondary | ICD-10-CM

## 2021-01-05 DIAGNOSIS — I509 Heart failure, unspecified: Secondary | ICD-10-CM | POA: Diagnosis not present

## 2021-01-05 DIAGNOSIS — I10 Essential (primary) hypertension: Secondary | ICD-10-CM

## 2021-01-05 MED ORDER — LISINOPRIL 10 MG PO TABS: 10.0000 mg | ORAL_TABLET | Freq: Every day | ORAL | 3 refills | Status: DC

## 2021-01-05 NOTE — Progress Notes (Unsigned)
I,April Miller,acting as a scribe for Wilhemena Durie, MD.,have documented all relevant documentation on the behalf of Wilhemena Durie, MD,as directed by  Wilhemena Durie, MD while in the presence of Wilhemena Durie, MD.  Established patient visit   Patient: Matthew [name]   DOB: 1946-03-19   75 y.o. Male  MRN: [id number] Visit Date: 01/05/2021  Today's healthcare provider: Wilhemena Durie, MD   Chief Complaint  Patient presents with  . Edema   Subjective    HPI  Follow up ER visit He actually noticed onset a week ago of below the knee lower extremity edema.  No other symptoms.  No chest pain no orthopnea no PND.  Was getting better when he went to the emergency room and has since resolved 1 Lasix daily.  He feels well overall. Patient was seen in ER for new onset CHF on 12/31/2020. He was treated for shortness of breath, lower extremity edema, and weakness. Treatment for this included starting on Lasix 20mg  daily. He reports excellent compliance with treatment. He reports this condition is Improved.  --------------------------------------------------------------------  Patient had swelling in his legs and feet, but these symptoms have subsided.       Medications: Outpatient Medications Prior to Visit  Medication Sig  . albuterol (VENTOLIN HFA) 108 (90 Base) MCG/ACT inhaler TAKE 2 PUFFS BY MOUTH EVERY 6 HOURS AS NEEDED FOR WHEEZE OR SHORTNESS OF BREATH  . aspirin 81 MG tablet Take 81 mg by mouth daily.  Marland Kitchen atorvastatin (LIPITOR) 20 MG tablet TAKE 1 TABLET BY MOUTH EVERY DAY  . fluticasone (FLONASE) 50 MCG/ACT nasal spray Place 2 sprays into both nostrils daily. (Patient taking differently: Place 2 sprays into both nostrils daily. As needed)  . folic acid (FOLVITE) 542 MCG tablet Take 400 mcg by mouth daily.  . furosemide (LASIX) 20 MG tablet Take 1 tablet (20 mg total) by mouth daily.  Marland Kitchen ibuprofen (ADVIL,MOTRIN) 200 MG tablet Take 200 mg by mouth every 4  (four) hours as needed.   . levalbuterol (XOPENEX HFA) 45 MCG/ACT inhaler Inhale 1-2 puffs into the lungs every 6 (six) hours as needed for wheezing.  Marland Kitchen levothyroxine (SYNTHROID) 50 MCG tablet TAKE 1 TABLET BY MOUTH EVERY DAY  . magnesium oxide (MAG-OX) 400 MG tablet Take 400 mg by mouth 2 (two) times daily.  . meloxicam (MOBIC) 15 MG tablet Take 15 mg by mouth daily.  . meloxicam (MOBIC) 7.5 MG tablet Take 7.5 mg by mouth 2 (two) times daily.  . Multiple Vitamins-Minerals (CENTRUM SILVER ADULT 50+ PO) Take 1 tablet by mouth daily.  . Naproxen Sodium 220 MG CAPS as needed.  . Oxymetazoline HCl (AFRIN NASAL SPRAY NA) Place into the nose as needed.  Marland Kitchen Phenylephrine-APAP-guaiFENesin 5-325-200 MG TABS Per bottle directions as needed  . Potassium 75 MG TABS Take 99 mg by mouth daily.   . sildenafil (REVATIO) 20 MG tablet Take 2 to 4 tablets daily as needed  . SYMBICORT 160-4.5 MCG/ACT inhaler INHALE 1-2 PUFFS TWICE DAILY   No facility-administered medications prior to visit.    Review of Systems  Constitutional: Negative for activity change, appetite change, chills, diaphoresis, fatigue, fever and unexpected weight change.  Cardiovascular: Positive for leg swelling. Negative for chest pain and palpitations.  Musculoskeletal: Negative for arthralgias, gait problem and myalgias.  Neurological: Negative for dizziness, syncope, weakness, light-headedness, numbness and headaches.        Objective    There were no vitals taken for  this visit. BP Readings from Last 3 Encounters:  01/05/21 (!) 162/84  12/31/20 (!) 173/89  08/06/20 (!) 159/91   Wt Readings from Last 3 Encounters:  01/05/21 249 lb (112.9 kg)  12/31/20 250 lb (113.4 kg)  06/15/20 261 lb 12.8 oz (118.8 kg)       Physical Exam Vitals reviewed.  Constitutional:      Appearance: He is well-developed.  HENT:     Head: Normocephalic and atraumatic.     Right Ear: External ear normal.     Left Ear: External ear normal.      Nose: Nose normal.  Eyes:     Conjunctiva/sclera: Conjunctivae normal.     Pupils: Pupils are equal, round, and reactive to light.  Cardiovascular:     Rate and Rhythm: Normal rate and regular rhythm.     Heart sounds: Normal heart sounds.  Pulmonary:     Effort: Pulmonary effort is normal.     Breath sounds: Normal breath sounds.  Abdominal:     General: Bowel sounds are normal.     Palpations: Abdomen is soft.  Genitourinary:    Penis: Normal.   Musculoskeletal:     Cervical back: Normal range of motion and neck supple.     Right lower leg: No edema.     Left lower leg: No edema.  Skin:    General: Skin is warm and dry.  Neurological:     General: No focal deficit present.     Mental Status: He is alert and oriented to person, place, and time.  Psychiatric:        Mood and Affect: Mood normal.        Behavior: Behavior normal.        Thought Content: Thought content normal.        Judgment: Judgment normal.       No results found for any visits on 01/05/21.  Assessment & Plan     1. Essential (primary) hypertension Well-controlled.  Lisinopril 10 mg daily - Ambulatory referral to Cardiology  2. Arteriosclerosis of coronary artery All risk factors treated patient on atorvastatin 20  3. OSA (obstructive sleep apnea) On CPAP  4. Acute on chronic congestive heart failure, unspecified heart failure type [facility]) Patient may have a little CHF.  Improved on Lasix.  Pending cardiology's evaluation we will start lisinopril 10 mg daily.  Follow-up in a couple of months and obtain lab data then.  To include renal panel - Ambulatory referral to Cardiology   No follow-ups on file.      I, Wilhemena Durie, MD, have reviewed all documentation for this visit. The documentation on 01/06/21 for the exam, diagnosis, procedures, and orders are all accurate and complete.    Richard Cranford Mon, MD  [facility] [phone] (phone) [phone] (fax)  [place]

## 2021-01-13 ENCOUNTER — Ambulatory Visit: Payer: Self-pay | Admitting: Family Medicine

## 2021-01-18 ENCOUNTER — Ambulatory Visit: Payer: Medicare Other | Admitting: Dermatology

## 2021-01-19 DIAGNOSIS — G4733 Obstructive sleep apnea (adult) (pediatric): Secondary | ICD-10-CM | POA: Diagnosis not present

## 2021-01-19 DIAGNOSIS — Z7689 Persons encountering health services in other specified circumstances: Secondary | ICD-10-CM | POA: Diagnosis not present

## 2021-01-19 DIAGNOSIS — E782 Mixed hyperlipidemia: Secondary | ICD-10-CM | POA: Diagnosis not present

## 2021-01-19 DIAGNOSIS — I5022 Chronic systolic (congestive) heart failure: Secondary | ICD-10-CM | POA: Diagnosis not present

## 2021-01-19 DIAGNOSIS — I25118 Atherosclerotic heart disease of native coronary artery with other forms of angina pectoris: Secondary | ICD-10-CM | POA: Diagnosis not present

## 2021-01-29 DIAGNOSIS — M2041 Other hammer toe(s) (acquired), right foot: Secondary | ICD-10-CM | POA: Diagnosis not present

## 2021-01-29 DIAGNOSIS — M216X1 Other acquired deformities of right foot: Secondary | ICD-10-CM | POA: Diagnosis not present

## 2021-01-29 DIAGNOSIS — M7741 Metatarsalgia, right foot: Secondary | ICD-10-CM | POA: Diagnosis not present

## 2021-01-29 DIAGNOSIS — L909 Atrophic disorder of skin, unspecified: Secondary | ICD-10-CM | POA: Diagnosis not present

## 2021-01-29 DIAGNOSIS — M79671 Pain in right foot: Secondary | ICD-10-CM | POA: Diagnosis not present

## 2021-01-29 DIAGNOSIS — M216X2 Other acquired deformities of left foot: Secondary | ICD-10-CM | POA: Diagnosis not present

## 2021-01-29 DIAGNOSIS — M2042 Other hammer toe(s) (acquired), left foot: Secondary | ICD-10-CM | POA: Diagnosis not present

## 2021-01-29 DIAGNOSIS — G5781 Other specified mononeuropathies of right lower limb: Secondary | ICD-10-CM | POA: Diagnosis not present

## 2021-02-04 ENCOUNTER — Telehealth: Payer: Self-pay

## 2021-02-04 MED ORDER — FUROSEMIDE 20 MG PO TABS: 20.0000 mg | ORAL_TABLET | Freq: Every day | ORAL | 0 refills | Status: DC

## 2021-02-04 NOTE — Telephone Encounter (Signed)
Copied from [place] [id number]. Topic: Quick Communication - Rx Refill/Question >> Feb 04, 2021  3:22 PM Loma Boston wrote: Medication: furosemide (LASIX) 20 MG tablet 30 tablet 0 12/31/2020 01/30/2021  Sig - Route: Take 1 tablet (20 mg total) by mouth daily. - Oral   Pt was in ED on 3/3 with heart failure, visit with Dr Wanda Plump on 3/8. Stated they discussed dr taking over the above med for fluid retention or something likened to it. Pt is now out of any fluid pill and there was not a fluid pill called in. Pt states due his condition he'd like to be taking some type of Fluid pill the above would be fine.   Has the patient contacted their pharmacy? Yes (Agent: If no, request that the patient contact the pharmacy for the refill.)  (Agent: If yes, when and what did the pharmacy advise?) Call PCP  Preferred Pharmacy (with phone number or street name): [id number] [place] [address]  Phone:  [phone] Fax:  [phone]     Agent: Please be advised that RX refills may take up to 3 business days. We ask that you follow-up with your pharmacy.

## 2021-02-04 NOTE — Telephone Encounter (Signed)
Advised patient as below.  

## 2021-02-04 NOTE — Telephone Encounter (Signed)
Refilled furosemide to [facility]

## 2021-02-08 DIAGNOSIS — I25118 Atherosclerotic heart disease of native coronary artery with other forms of angina pectoris: Secondary | ICD-10-CM | POA: Diagnosis not present

## 2021-02-08 DIAGNOSIS — I5022 Chronic systolic (congestive) heart failure: Secondary | ICD-10-CM | POA: Diagnosis not present

## 2021-02-19 DIAGNOSIS — G4733 Obstructive sleep apnea (adult) (pediatric): Secondary | ICD-10-CM | POA: Diagnosis not present

## 2021-02-25 ENCOUNTER — Ambulatory Visit (INDEPENDENT_AMBULATORY_CARE_PROVIDER_SITE_OTHER): Payer: Medicare Other | Admitting: Family Medicine

## 2021-02-25 ENCOUNTER — Other Ambulatory Visit: Payer: Self-pay

## 2021-02-25 ENCOUNTER — Encounter: Payer: Self-pay | Admitting: Family Medicine

## 2021-02-25 VITALS — BP 112/73 | HR 75 | Temp 98.3°F | Resp 18 | Ht 73.0 in | Wt 243.0 lb

## 2021-02-25 DIAGNOSIS — G4733 Obstructive sleep apnea (adult) (pediatric): Secondary | ICD-10-CM

## 2021-02-25 DIAGNOSIS — R059 Cough, unspecified: Secondary | ICD-10-CM | POA: Diagnosis not present

## 2021-02-25 DIAGNOSIS — I2510 Atherosclerotic heart disease of native coronary artery without angina pectoris: Secondary | ICD-10-CM

## 2021-02-25 DIAGNOSIS — E785 Hyperlipidemia, unspecified: Secondary | ICD-10-CM | POA: Diagnosis not present

## 2021-02-25 DIAGNOSIS — E039 Hypothyroidism, unspecified: Secondary | ICD-10-CM | POA: Diagnosis not present

## 2021-02-25 DIAGNOSIS — I10 Essential (primary) hypertension: Secondary | ICD-10-CM

## 2021-02-25 DIAGNOSIS — I509 Heart failure, unspecified: Secondary | ICD-10-CM

## 2021-02-25 NOTE — Progress Notes (Signed)
I,April Miller,acting as a scribe for Wilhemena Durie, MD.,have documented all relevant documentation on the behalf of Wilhemena Durie, MD,as directed by  Wilhemena Durie, MD while in the presence of Wilhemena Durie, MD.   Established patient visit   Patient: Matthew [name]   DOB: [birth year]   74 y.o. Male  MRN: [id number] Visit Date: 02/25/2021  Today's healthcare provider: Wilhemena Durie, MD   Chief Complaint  Patient presents with  . Follow-up  . Hypertension  . Congestive Heart Failure   Subjective    HPI  Patient comes in today for follow-up.  He does have an intermittent nonproductive cough on occasion.  No other symptoms.  He is a non-smoker.  No chest pain or shortness of breath.  Orthopnea or PND. Hypertension, follow-up  BP Readings from Last 3 Encounters:  02/25/21 112/73  01/05/21 (!) 162/84  12/31/20 (!) 173/89   Wt Readings from Last 3 Encounters:  02/25/21 243 lb (110.2 kg)  01/05/21 249 lb (112.9 kg)  12/31/20 250 lb (113.4 kg)     He was last seen for hypertension 1 months ago.  BP at that visit was 162/84. Management since that visit includes; Well-controlled.  Lisinopril 10 mg daily.  He reports good compliance with treatment. He is not having side effects. none He is exercising. He is adherent to low salt diet.   Outside blood pressures are normal.  He does not smoke.  Use of agents associated with hypertension: none.   --------------------------------------------------------------------  OSA (obstructive sleep apnea) From 01/05/2021-On CPAP.  Acute on chronic congestive heart failure, unspecified heart failure type ([place]) From 01/05/2021-Patient may have a little CHF.  Improved on Lasix.  Pending cardiology's evaluation we will start lisinopril 10 mg daily.  Follow-up in a couple of months and obtain lab data then.  To include renal panel. Referred to Cardiology       Medications: Outpatient Medications Prior to  Visit  Medication Sig  . albuterol (VENTOLIN HFA) 108 (90 Base) MCG/ACT inhaler TAKE 2 PUFFS BY MOUTH EVERY 6 HOURS AS NEEDED FOR WHEEZE OR SHORTNESS OF BREATH  . atorvastatin (LIPITOR) 20 MG tablet TAKE 1 TABLET BY MOUTH EVERY DAY  . fexofenadine (ALLEGRA) 180 MG tablet Take 180 mg by mouth daily.  . furosemide (LASIX) 20 MG tablet Take 1 tablet (20 mg total) by mouth daily.  Marland Kitchen levalbuterol (XOPENEX HFA) 45 MCG/ACT inhaler Inhale 1-2 puffs into the lungs every 6 (six) hours as needed for wheezing.  Marland Kitchen levothyroxine (SYNTHROID) 50 MCG tablet TAKE 1 TABLET BY MOUTH EVERY DAY  . Multiple Vitamins-Minerals (CENTRUM SILVER ADULT 50+ PO) Take 1 tablet by mouth daily.  . Naproxen Sodium 220 MG CAPS as needed.  Marland Kitchen Phenylephrine-APAP-guaiFENesin 5-325-200 MG TABS Per bottle directions as needed  . Potassium 75 MG TABS Take 99 mg by mouth daily.  . sildenafil (REVATIO) 20 MG tablet Take 2 to 4 tablets daily as needed  . SYMBICORT 160-4.5 MCG/ACT inhaler INHALE 1-2 PUFFS TWICE DAILY  . lisinopril (ZESTRIL) 10 MG tablet Take 1 tablet (10 mg total) by mouth daily. (Patient not taking: No sig reported)  . meloxicam (MOBIC) 15 MG tablet Take 15 mg by mouth daily. (Patient not taking: No sig reported)   No facility-administered medications prior to visit.    Review of Systems  Constitutional: Negative for appetite change, chills and fever.  Respiratory: Negative for chest tightness, shortness of breath and wheezing.   Cardiovascular: Negative for chest  pain and palpitations.  Gastrointestinal: Negative for abdominal pain, nausea and vomiting.        Objective    BP 112/73 (BP Location: Left Arm, Patient Position: Sitting, Cuff Size: Large)   Pulse 75   Temp 98.3 F (36.8 C) (Oral)   Resp 18   Ht 6\' 1"  (1.854 m)   Wt 243 lb (110.2 kg)   SpO2 96%   BMI 32.06 kg/m  BP Readings from Last 3 Encounters:  02/25/21 112/73  01/05/21 (!) 162/84  12/31/20 (!) 173/89   Wt Readings from Last 3  Encounters:  02/25/21 243 lb (110.2 kg)  01/05/21 249 lb (112.9 kg)  12/31/20 250 lb (113.4 kg)       Physical Exam Vitals reviewed.  Constitutional:      Appearance: He is well-developed.  HENT:     Head: Normocephalic and atraumatic.     Right Ear: External ear normal.     Left Ear: External ear normal.     Nose: Nose normal.  Eyes:     Conjunctiva/sclera: Conjunctivae normal.     Pupils: Pupils are equal, round, and reactive to light.  Cardiovascular:     Rate and Rhythm: Normal rate and regular rhythm.     Heart sounds: Normal heart sounds.  Pulmonary:     Effort: Pulmonary effort is normal.     Breath sounds: Normal breath sounds.  Abdominal:     General: Bowel sounds are normal.     Palpations: Abdomen is soft.  Genitourinary:    Penis: Normal.   Musculoskeletal:     Cervical back: Normal range of motion and neck supple.     Right lower leg: No edema.     Left lower leg: No edema.  Skin:    General: Skin is warm and dry.  Neurological:     General: No focal deficit present.     Mental Status: He is alert and oriented to person, place, and time.  Psychiatric:        Mood and Affect: Mood normal.        Behavior: Behavior normal.        Thought Content: Thought content normal.        Judgment: Judgment normal.       No results found for any visits on 02/25/21.  Assessment & Plan     1. Essential (primary) hypertension At this time stop lisinopril because of the cough.  Quickly add back an ARB if the cough resolves. - Renal function panel  2. Acute on chronic congestive heart failure, unspecified heart failure type ([place]) Follow-up shortly on blood pressure and clinical status.  For all patient is doing well presently. - Renal function panel  3. Cough See #1.  Stop lisinopril  4. OSA (obstructive sleep apnea)   5. Arteriosclerosis of coronary artery All risk factors treated  6. Adult hypothyroidism   7. Hyperlipidemia, unspecified  hyperlipidemia type On atorvastatin 20.   No follow-ups on file.      I, Wilhemena Durie, MD, have reviewed all documentation for this visit. The documentation on 03/02/21 for the exam, diagnosis, procedures, and orders are all accurate and complete.    Nike Southers Cranford Mon, MD  [facility] [phone] (phone) [phone] (fax)  [place]

## 2021-02-26 LAB — RENAL FUNCTION PANEL
Albumin: 4.3 g/dL (ref 3.7–4.7)
BUN/Creatinine Ratio: 25 — ABNORMAL HIGH (ref 10–24)
BUN: 26 mg/dL (ref 8–27)
CO2: 26 mmol/L (ref 20–29)
Calcium: 9.1 mg/dL (ref 8.6–10.2)
Chloride: 104 mmol/L (ref 96–106)
Creatinine, Ser: 1.04 mg/dL (ref 0.76–1.27)
Glucose: 83 mg/dL (ref 65–99)
Phosphorus: 3.4 mg/dL (ref 2.8–4.1)
Potassium: 4.7 mmol/L (ref 3.5–5.2)
Sodium: 143 mmol/L (ref 134–144)
eGFR: 75 mL/min/{1.73_m2} (ref >59)

## 2021-02-28 ENCOUNTER — Other Ambulatory Visit: Payer: Self-pay | Admitting: Physician Assistant

## 2021-03-05 DIAGNOSIS — I2510 Atherosclerotic heart disease of native coronary artery without angina pectoris: Secondary | ICD-10-CM | POA: Diagnosis not present

## 2021-03-15 DIAGNOSIS — G4733 Obstructive sleep apnea (adult) (pediatric): Secondary | ICD-10-CM | POA: Diagnosis not present

## 2021-03-15 DIAGNOSIS — I2510 Atherosclerotic heart disease of native coronary artery without angina pectoris: Secondary | ICD-10-CM | POA: Diagnosis not present

## 2021-03-15 DIAGNOSIS — E782 Mixed hyperlipidemia: Secondary | ICD-10-CM | POA: Diagnosis not present

## 2021-03-17 DIAGNOSIS — Z79899 Other long term (current) drug therapy: Secondary | ICD-10-CM | POA: Diagnosis not present

## 2021-03-31 ENCOUNTER — Other Ambulatory Visit: Payer: Self-pay

## 2021-03-31 ENCOUNTER — Ambulatory Visit (INDEPENDENT_AMBULATORY_CARE_PROVIDER_SITE_OTHER): Payer: Medicare Other

## 2021-03-31 ENCOUNTER — Encounter: Payer: Self-pay | Admitting: Podiatry

## 2021-03-31 ENCOUNTER — Ambulatory Visit (INDEPENDENT_AMBULATORY_CARE_PROVIDER_SITE_OTHER): Payer: Medicare Other | Admitting: Podiatry

## 2021-03-31 DIAGNOSIS — M778 Other enthesopathies, not elsewhere classified: Secondary | ICD-10-CM

## 2021-03-31 NOTE — Progress Notes (Signed)
Subjective:  Patient ID: Matthew [name], male    DOB: [birth year],  MRN: [id number] HPI Chief Complaint  Patient presents with  . Foot Pain    Plantar forefoot right - aching x 1 year, feels like a ball, 2nd toe curling, saw podiatrist-injected  . New Patient (Initial Visit)    75 y.o. male presents with the above complaint.   ROS: Denies fever chills nausea vomit muscle aches pains calf pain back pain chest pain shortness of breath.  States that he is recently noticed his hammertoe development.  States that he another doctor had injected the area with Kenalog/cortisone and it really did not help at all.  Past Medical History:  Diagnosis Date  . Allergic rhinitis, cause unspecified   . Allergy   . Arthritis   . Asthma   . Benign neoplasm of colon   . Clotting disorder ([place])    arterial bypass   . Collapsed lung   . COPD (chronic obstructive pulmonary disease) ([place])   . ED (erectile dysfunction)   . GERD (gastroesophageal reflux disease)   . Hyperlipidemia   . Hypertension   . Internal hemorrhoids without mention of complication   . Left testicular cancer ([place]) 07/2014  . OSA (obstructive sleep apnea)    wears CPAP  . Sleep apnea    occasional CPAP  . Thyroid disease   . Unspecified asthma(493.90)   . Unspecified sinusitis (chronic)    Past Surgical History:  Procedure Laterality Date  . COLONOSCOPY    . DENTAL SURGERY  12/2014  . INGUINAL HERNIA REPAIR    . NOSE SURGERY     submucous surgery  . POLYPECTOMY    . SINUS SURGERY WITH INSTATRAK    . SUBCLAVIAN BYPASS GRAFT  2009  . TESTICLE REMOVAL      Current Outpatient Medications:  .  albuterol (VENTOLIN HFA) 108 (90 Base) MCG/ACT inhaler, TAKE 2 PUFFS BY MOUTH EVERY 6 HOURS AS NEEDED FOR WHEEZE OR SHORTNESS OF BREATH, Disp: 18 each, Rfl: 11 .  atorvastatin (LIPITOR) 20 MG tablet, TAKE 1 TABLET BY MOUTH EVERY DAY, Disp: 90 tablet, Rfl: 3 .  fexofenadine (ALLEGRA) 180 MG tablet, Take 180 mg by mouth daily.,  Disp: , Rfl:  .  furosemide (LASIX) 20 MG tablet, TAKE 1 TABLET BY MOUTH EVERY DAY, Disp: 30 tablet, Rfl: 0 .  levalbuterol (XOPENEX HFA) 45 MCG/ACT inhaler, Inhale 1-2 puffs into the lungs every 6 (six) hours as needed for wheezing., Disp: 1 Inhaler, Rfl: 11 .  levothyroxine (SYNTHROID) 50 MCG tablet, TAKE 1 TABLET BY MOUTH EVERY DAY, Disp: 90 tablet, Rfl: 0 .  lisinopril (ZESTRIL) 10 MG tablet, Take 1 tablet (10 mg total) by mouth daily. (Patient not taking: No sig reported), Disp: 90 tablet, Rfl: 3 .  meloxicam (MOBIC) 15 MG tablet, Take 15 mg by mouth daily. (Patient not taking: No sig reported), Disp: , Rfl:  .  Multiple Vitamins-Minerals (CENTRUM SILVER ADULT 50+ PO), Take 1 tablet by mouth daily., Disp: , Rfl:  .  Naproxen Sodium 220 MG CAPS, as needed., Disp: , Rfl:  .  Phenylephrine-APAP-guaiFENesin 5-325-200 MG TABS, Per bottle directions as needed, Disp: , Rfl:  .  Potassium 75 MG TABS, Take 99 mg by mouth daily., Disp: , Rfl:  .  sildenafil (REVATIO) 20 MG tablet, Take 2 to 4 tablets daily as needed, Disp: 50 tablet, Rfl: 12 .  SYMBICORT 160-4.5 MCG/ACT inhaler, INHALE 1-2 PUFFS TWICE DAILY, Disp: 10.2 Inhaler, Rfl: 12  No Known  Allergies Review of Systems Objective:  There were no vitals filed for this visit.  General: Well developed, nourished, in no acute distress, alert and oriented x3   Dermatological: Skin is warm, dry and supple bilateral. Nails x 10 are well maintained; remaining integument appears unremarkable at this time. There are no open sores, no preulcerative lesions, no rash or signs of infection present.  Vascular: Dorsalis Pedis artery and Posterior Tibial artery pedal pulses are 2/4 bilateral with immedate capillary fill time. Pedal hair growth present. No varicosities and no lower extremity edema present bilateral.   Neruologic: Grossly intact via light touch bilateral. Vibratory intact via tuning fork bilateral. Protective threshold with Semmes Wienstein  monofilament intact to all pedal sites bilateral. Patellar and Achilles deep tendon reflexes 2+ bilateral. No Babinski or clonus noted bilateral.   Musculoskeletal: No gross boney pedal deformities bilateral. No pain, crepitus, or limitation noted with foot and ankle range of motion bilateral. Muscular strength 5/5 in all groups tested bilateral.  Pain on range of motion the second metatarsophalangeal joints particularly on end range of motion he also has hammertoe deformity second third right which are more rigid in nature particularly the PIPJ second  Gait: Unassisted, Nonantalgic.    Radiographs:  Radiographs taken today demonstrate osseously mature individual hammertoe development.  No acute osseous abnormalities.  Assessment & Plan:   Assessment: Capsulitis second and third metatarsophalangeal joints with hammertoe deformities #2 #3 the right foot.  Plan: Discussed etiology pathology conservative versus surgical therapies.  At this point he would like to try orthotics.  I he was casted today for orthotics and I will follow-up with him in the next few weeks.     Camdan Burdi T. [place], [place]

## 2021-04-01 ENCOUNTER — Other Ambulatory Visit: Payer: Self-pay | Admitting: Family Medicine

## 2021-04-19 ENCOUNTER — Other Ambulatory Visit: Payer: Self-pay

## 2021-04-19 ENCOUNTER — Ambulatory Visit (INDEPENDENT_AMBULATORY_CARE_PROVIDER_SITE_OTHER): Payer: Medicare Other

## 2021-04-19 DIAGNOSIS — M2041 Other hammer toe(s) (acquired), right foot: Secondary | ICD-10-CM

## 2021-04-19 DIAGNOSIS — M778 Other enthesopathies, not elsewhere classified: Secondary | ICD-10-CM

## 2021-04-19 NOTE — Progress Notes (Signed)
Patient presents for orthotic pick up.  Verbal and written break in and wear instructions given.  Patient will follow up in 4 weeks with Dr if symptoms worsen or fail to improve. 

## 2021-05-04 ENCOUNTER — Other Ambulatory Visit: Payer: Self-pay | Admitting: Family Medicine

## 2021-05-17 DIAGNOSIS — J310 Chronic rhinitis: Secondary | ICD-10-CM | POA: Diagnosis not present

## 2021-05-17 DIAGNOSIS — H903 Sensorineural hearing loss, bilateral: Secondary | ICD-10-CM | POA: Diagnosis not present

## 2021-05-17 DIAGNOSIS — J320 Chronic maxillary sinusitis: Secondary | ICD-10-CM | POA: Diagnosis not present

## 2021-05-17 DIAGNOSIS — J324 Chronic pansinusitis: Secondary | ICD-10-CM | POA: Diagnosis not present

## 2021-05-17 DIAGNOSIS — R059 Cough, unspecified: Secondary | ICD-10-CM | POA: Diagnosis not present

## 2021-05-29 ENCOUNTER — Other Ambulatory Visit: Payer: Self-pay | Admitting: Family Medicine

## 2021-05-29 NOTE — Telephone Encounter (Signed)
Requested Prescriptions  Pending Prescriptions Disp Refills  . furosemide (LASIX) 20 MG tablet [Pharmacy Med Name: FUROSEMIDE 20 MG TABLET] 30 tablet 0    Sig: TAKE 1 TABLET BY MOUTH EVERY DAY     Cardiovascular:  Diuretics - Loop Passed - 05/29/2021  8:31 AM      Passed - K in normal range and within 360 days    Potassium  Date Value Ref Range Status  02/25/2021 4.7 3.5 - 5.2 mmol/L Final  06/26/2014 3.7 3.5 - 5.1 mmol/L Final         Passed - Ca in normal range and within 360 days    Calcium  Date Value Ref Range Status  02/25/2021 9.1 8.6 - 10.2 mg/dL Final   Calcium, Total  Date Value Ref Range Status  06/26/2014 8.4 (L) 8.5 - 10.1 mg/dL Final         Passed - Na in normal range and within 360 days    Sodium  Date Value Ref Range Status  02/25/2021 143 134 - 144 mmol/L Final  06/26/2014 141 136 - 145 mmol/L Final         Passed - Cr in normal range and within 360 days    Creatinine  Date Value Ref Range Status  06/26/2014 0.97 0.60 - 1.30 mg/dL Final   Creatinine, Ser  Date Value Ref Range Status  02/25/2021 1.04 0.76 - 1.27 mg/dL Final         Passed - Last BP in normal range    BP Readings from Last 1 Encounters:  02/25/21 112/73         Passed - Valid encounter within last 6 months    Recent Outpatient Visits          3 months ago Essential (primary) hypertension   [place] Jerrol Banana., MD   4 months ago Essential (primary) hypertension   [facility] Jerrol Banana., MD   9 months ago [place] Flinchum, Kelby Aline, FNP   11 months ago Annual physical exam   [facility] Jerrol Banana., MD   1 year ago Annual physical exam   [facility] Jerrol Banana., MD      Future Appointments            In 3 weeks Jerrol Banana., MD [facility], [place]

## 2021-05-31 ENCOUNTER — Ambulatory Visit: Payer: Medicare Other | Admitting: Podiatry

## 2021-06-04 DIAGNOSIS — H905 Unspecified sensorineural hearing loss: Secondary | ICD-10-CM | POA: Diagnosis not present

## 2021-06-14 ENCOUNTER — Ambulatory Visit: Payer: Medicare Other | Admitting: Podiatry

## 2021-06-14 DIAGNOSIS — R053 Chronic cough: Secondary | ICD-10-CM | POA: Diagnosis not present

## 2021-06-14 DIAGNOSIS — J320 Chronic maxillary sinusitis: Secondary | ICD-10-CM | POA: Diagnosis not present

## 2021-06-16 ENCOUNTER — Ambulatory Visit: Payer: Medicare Other | Admitting: Podiatry

## 2021-06-16 ENCOUNTER — Encounter: Payer: Self-pay | Admitting: Podiatry

## 2021-06-16 ENCOUNTER — Other Ambulatory Visit: Payer: Self-pay

## 2021-06-16 DIAGNOSIS — S99921A Unspecified injury of right foot, initial encounter: Secondary | ICD-10-CM | POA: Diagnosis not present

## 2021-06-16 DIAGNOSIS — M778 Other enthesopathies, not elsewhere classified: Secondary | ICD-10-CM | POA: Diagnosis not present

## 2021-06-16 MED ORDER — TRIAMCINOLONE ACETONIDE 40 MG/ML IJ SUSP
20.0000 mg | Freq: Once | INTRAMUSCULAR | Status: AC
  Administered 2021-06-16: 20 mg

## 2021-06-16 NOTE — Progress Notes (Signed)
He presents today for follow-up of his orthotics states that the orthotics are doing fine but my toes still hurting is really not helping that at all as he refers to the second and third metatarsophalangeal joint of the right foot.  Objective: Vital signs are stable alert oriented x3.  Pulses are palpable.  He has significant medial deviation of the second and third toes of the right foot with tenderness on end range of motion and the second toe overlapping the hallux with plantar flexion.  Assessment most likely plantarflexed 3 plate capsular tear second metatarsophalangeal joint with chronic capsulitis.  Hammertoe deformity second right.  Plan: At this point I feel is necessary to go ahead and get a MRI of the second and third plantar plates.  He understands and is this is amendable to it and went ahead and injected him today with 10 mg Kenalog.  I will follow-up with him once his MRI is complete.

## 2021-06-21 ENCOUNTER — Other Ambulatory Visit: Payer: Self-pay

## 2021-06-21 ENCOUNTER — Ambulatory Visit (INDEPENDENT_AMBULATORY_CARE_PROVIDER_SITE_OTHER): Payer: Medicare Other | Admitting: Family Medicine

## 2021-06-21 ENCOUNTER — Encounter: Payer: Self-pay | Admitting: Family Medicine

## 2021-06-21 VITALS — BP 125/72 | HR 66 | Temp 98.7°F | Resp 16 | Ht 73.0 in | Wt 239.0 lb

## 2021-06-21 DIAGNOSIS — J449 Chronic obstructive pulmonary disease, unspecified: Secondary | ICD-10-CM

## 2021-06-21 DIAGNOSIS — E785 Hyperlipidemia, unspecified: Secondary | ICD-10-CM

## 2021-06-21 DIAGNOSIS — E039 Hypothyroidism, unspecified: Secondary | ICD-10-CM

## 2021-06-21 DIAGNOSIS — Z0000 Encounter for general adult medical examination without abnormal findings: Secondary | ICD-10-CM

## 2021-06-21 DIAGNOSIS — I10 Essential (primary) hypertension: Secondary | ICD-10-CM

## 2021-06-21 DIAGNOSIS — G4733 Obstructive sleep apnea (adult) (pediatric): Secondary | ICD-10-CM

## 2021-06-21 NOTE — Progress Notes (Signed)
Annual Wellness Visit     Patient: Matthew [name], Male    DOB: 1946/10/04, 75 y.o.   MRN: [id number] Visit Date: 06/21/2021  Today's Provider: Wilhemena Durie, MD   Chief Complaint  Patient presents with   Annual Exam   Subjective    Matthew [name] is a 75 y.o. male who presents today for his Annual Wellness Visit.Annual Physical. He reports consuming a general diet. Home exercise routine includes walking. He generally feels well. He reports sleeping well. He does not have additional problems to discuss today.       Medications: Outpatient Medications Prior to Visit  Medication Sig   albuterol (VENTOLIN HFA) 108 (90 Base) MCG/ACT inhaler TAKE 2 PUFFS BY MOUTH EVERY 6 HOURS AS NEEDED FOR WHEEZE OR SHORTNESS OF BREATH   atorvastatin (LIPITOR) 20 MG tablet TAKE 1 TABLET BY MOUTH EVERY DAY   fexofenadine (ALLEGRA) 180 MG tablet Take 180 mg by mouth daily.   furosemide (LASIX) 20 MG tablet TAKE 1 TABLET BY MOUTH EVERY DAY   levalbuterol (XOPENEX HFA) 45 MCG/ACT inhaler Inhale 1-2 puffs into the lungs every 6 (six) hours as needed for wheezing.   Multiple Vitamins-Minerals (CENTRUM SILVER ADULT 50+ PO) Take 1 tablet by mouth daily.   Naproxen Sodium 220 MG CAPS as needed.   Phenylephrine-APAP-guaiFENesin 5-325-200 MG TABS Per bottle directions as needed   Potassium 75 MG TABS Take 99 mg by mouth daily.   sildenafil (REVATIO) 20 MG tablet Take 2 to 4 tablets daily as needed   SYMBICORT 160-4.5 MCG/ACT inhaler INHALE 1-2 PUFFS TWICE DAILY   levothyroxine (SYNTHROID) 50 MCG tablet TAKE 1 TABLET BY MOUTH EVERY DAY   lisinopril (ZESTRIL) 10 MG tablet Take 1 tablet (10 mg total) by mouth daily. (Patient not taking: No sig reported)   meloxicam (MOBIC) 15 MG tablet Take 15 mg by mouth daily. (Patient not taking: No sig reported)   No facility-administered medications prior to visit.    No Known Allergies  Patient Care Team: Jerrol Banana., MD as PCP - General  (Unknown Physician Specialty) Pa, [place] as Consulting Physician (Optometry) Irene Shipper, MD as Consulting Physician (Gastroenterology) Eulogio Bear, MD as Consulting Physician (Ophthalmology) Erby Pian, MD as Referring Physician (Specialist) Earnestine Leys, MD (Orthopedic Surgery) Sharlotte Alamo, [place] (Podiatry) Hollice Espy, MD as Consulting Physician (Urology) Royston Cowper, MD as Consulting Physician (Urology)  Review of Systems  All other systems reviewed and are negative.       Objective    Vitals: BP 125/72   Pulse 66   Temp 98.7 F (37.1 C)   Resp 16   Ht '6\' 1"'$  (1.854 m)   Wt 239 lb (108.4 kg)   BMI 31.53 kg/m  BP Readings from Last 3 Encounters:  06/21/21 125/72  02/25/21 112/73  01/05/21 (!) 162/84   Wt Readings from Last 3 Encounters:  06/21/21 239 lb (108.4 kg)  02/25/21 243 lb (110.2 kg)  01/05/21 249 lb (112.9 kg)      Physical Exam Vitals reviewed.  Constitutional:      Appearance: He is well-developed.  HENT:     Head: Normocephalic and atraumatic.     Right Ear: External ear normal.     Left Ear: External ear normal.     Nose: Nose normal.  Eyes:     Conjunctiva/sclera: Conjunctivae normal.     Pupils: Pupils are equal, round, and reactive to light.  Cardiovascular:  Rate and Rhythm: Normal rate and regular rhythm.     Heart sounds: Normal heart sounds.  Pulmonary:     Effort: Pulmonary effort is normal.     Breath sounds: Normal breath sounds.  Abdominal:     General: Bowel sounds are normal.     Palpations: Abdomen is soft.  Genitourinary:    Penis: Normal.   Musculoskeletal:     Cervical back: Normal range of motion and neck supple.     Right lower leg: No edema.     Left lower leg: No edema.  Skin:    General: Skin is warm and dry.  Neurological:     General: No focal deficit present.     Mental Status: He is alert and oriented to person, place, and time.  Psychiatric:        Mood and Affect:  Mood normal.        Behavior: Behavior normal.        Thought Content: Thought content normal.        Judgment: Judgment normal.     Most recent functional status assessment: In your present state of health, do you have any difficulty performing the following activities: 06/21/2021  Hearing? Y  Vision? N  Difficulty concentrating or making decisions? N  Walking or climbing stairs? N  Dressing or bathing? N  Doing errands, shopping? N  Some recent data might be hidden   Most recent fall risk assessment: Fall Risk  06/21/2021  Falls in the past year? 0  Number falls in past yr: 0  Injury with Fall? 0  Risk for fall due to : No Fall Risks  Follow up Falls evaluation completed    Most recent depression screenings: PHQ 2/9 Scores 06/21/2021 06/15/2020  PHQ - 2 Score 1 2  PHQ- 9 Score 8 6   Most recent cognitive screening: 6CIT Screen 06/21/2021  What Year? 0 points  What month? 0 points  What time? 0 points  Count back from 20 0 points  Months in reverse 0 points  Repeat phrase 0 points  Total Score 0   Most recent Audit-C alcohol use screening Alcohol Use Disorder Test (AUDIT) 06/21/2021  1. How often do you have a drink containing alcohol? 4  2. How many drinks containing alcohol do you have on a typical day when you are drinking? 0  3. How often do you have six or more drinks on one occasion? 1  AUDIT-C Score 5  4. How often during the last year have you found that you were not able to stop drinking once you had started? 0  5. How often during the last year have you failed to do what was normally expected from you because of drinking? 0  6. How often during the last year have you needed a first drink in the morning to get yourself going after a heavy drinking session? 0  7. How often during the last year have you had a feeling of guilt of remorse after drinking? 0  8. How often during the last year have you been unable to remember what happened the night before because you  had been drinking? 0  9. Have you or someone else been injured as a result of your drinking? 0  10. Has a relative or friend or a doctor or another health worker been concerned about your drinking or suggested you cut down? 0  Alcohol Use Disorder Identification Test Final Score (AUDIT) 5  Alcohol Brief Interventions/Follow-up -  A score of 3 or more in women, and 4 or more in men indicates increased risk for alcohol abuse, EXCEPT if all of the points are from question 1   No results found for any visits on 06/21/21.  Assessment & Plan     Annual wellness visit done today including the all of the following: Reviewed patient's Family Medical History Reviewed and updated list of patient's medical providers Assessment of cognitive impairment was done Assessed patient's functional ability Established a written schedule for health screening [place] Completed and Reviewed  Exercise Activities and Dietary recommendations  Goals      DIET - INCREASE WATER INTAKE     Recommend increasing water intake to 4-6 glasses a day.      Exercise 3x per week (30 min per time)     Recommend to exercise for 3 days a week for at least 30 minutes at a time.         Immunization History  Administered Date(s) Administered   Fluad Quad(high Dose 65+) 06/26/2019, 07/15/2020   Influenza Whole 07/31/2012   Influenza, High Dose Seasonal PF 10/10/2016, 09/26/2017   Pneumococcal Conjugate-13 08/04/2014   Pneumococcal Polysaccharide-23 05/24/2017   Td 07/30/2004   Tdap 08/04/2014, 11/24/2016    Health Maintenance  Topic Date Due   COVID-19 Vaccine (1) Never done   Zoster Vaccines- Shingrix (1 of 2) Never done   INFLUENZA VACCINE  05/31/2021   COLONOSCOPY (Pts 45-68yr Insurance coverage will need to be confirmed)  07/16/2022   TETANUS/TDAP  11/24/2026   Hepatitis C Screening  Completed   PNA vac Low Risk Adult  Completed   HPV VACCINES  Aged Out   1. Encounter for  Medicare annual wellness exam   2. Annual physical exam  3. Essential (primary) hypertension  - TSH - CBC w/Diff/Platelet - Comprehensive Metabolic Panel (CMET)  4. Hyperlipidemia, unspecified hyperlipidemia type  - Lipid panel - TSH - Comprehensive Metabolic Panel (CMET)  5. Adult hypothyroidism   6. Chronic obstructive pulmonary disease, unspecified COPD type ([place]   7. OSA (obstructive sleep apnea)  8.Headache Patient been having headache for about 2 months the middle of his head.  Last 1 to 2 minutes and spontaneously resolved.  May be weekly.  Consider imaging if this worsens.   Discussed health benefits of physical activity, and encouraged him to engage in regular exercise appropriate for his age and condition.      No follow-ups on file.     I, RWilhemena Durie MD, have reviewed all documentation for this visit. The documentation on 06/27/21 for the exam, diagnosis, procedures, and orders are all accurate and complete.    Ryoma Nofziger GCranford Mon MD  [facility]3718 [phone](phone) [phone](fax)  [place]

## 2021-06-22 DIAGNOSIS — I10 Essential (primary) hypertension: Secondary | ICD-10-CM | POA: Diagnosis not present

## 2021-06-22 DIAGNOSIS — E785 Hyperlipidemia, unspecified: Secondary | ICD-10-CM | POA: Diagnosis not present

## 2021-06-23 ENCOUNTER — Ambulatory Visit
Admission: RE | Admit: 2021-06-23 | Discharge: 2021-06-23 | Disposition: A | Discharge status: Home / Self Care | Payer: Medicare Other | Source: Ambulatory Visit | Attending: Podiatry | Admitting: Podiatry

## 2021-06-23 ENCOUNTER — Other Ambulatory Visit: Payer: Self-pay

## 2021-06-23 DIAGNOSIS — S99921A Unspecified injury of right foot, initial encounter: Secondary | ICD-10-CM | POA: Diagnosis not present

## 2021-06-23 DIAGNOSIS — M19071 Primary osteoarthritis, right ankle and foot: Secondary | ICD-10-CM | POA: Diagnosis not present

## 2021-06-23 LAB — CBC WITH DIFFERENTIAL/PLATELET
Basophils Absolute: 0 10*3/uL (ref 0.0–0.2)
Basos: 1 %
EOS (ABSOLUTE): 0.2 10*3/uL (ref 0.0–0.4)
Eos: 3 %
Hematocrit: 47.8 % (ref 37.5–51.0)
Hemoglobin: 16.7 g/dL (ref 13.0–17.7)
Immature Grans (Abs): 0 10*3/uL (ref 0.0–0.1)
Immature Granulocytes: 0 %
Lymphocytes Absolute: 1.5 10*3/uL (ref 0.7–3.1)
Lymphs: 20 %
MCH: 33.1 pg — ABNORMAL HIGH (ref 26.6–33.0)
MCHC: 34.9 g/dL (ref 31.5–35.7)
MCV: 95 fL (ref 79–97)
Monocytes Absolute: 0.8 10*3/uL (ref 0.1–0.9)
Monocytes: 11 %
Neutrophils Absolute: 4.8 10*3/uL (ref 1.4–7.0)
Neutrophils: 65 %
Platelets: 240 10*3/uL (ref 150–450)
RBC: 5.04 x10E6/uL (ref 4.14–5.80)
RDW: 11.9 % (ref 11.6–15.4)
WBC: 7.3 10*3/uL (ref 3.4–10.8)

## 2021-06-23 LAB — COMPREHENSIVE METABOLIC PANEL
ALT: 37 IU/L (ref 0–44)
AST: 25 IU/L (ref 0–40)
Albumin/Globulin Ratio: 2 (ref 1.2–2.2)
Albumin: 4.5 g/dL (ref 3.7–4.7)
Alkaline Phosphatase: 53 IU/L (ref 44–121)
BUN/Creatinine Ratio: 20 (ref 10–24)
BUN: 19 mg/dL (ref 8–27)
Bilirubin Total: 0.8 mg/dL (ref 0.0–1.2)
CO2: 25 mmol/L (ref 20–29)
Calcium: 9.5 mg/dL (ref 8.6–10.2)
Chloride: 100 mmol/L (ref 96–106)
Creatinine, Ser: 0.96 mg/dL (ref 0.76–1.27)
Globulin, Total: 2.2 g/dL (ref 1.5–4.5)
Glucose: 91 mg/dL (ref 65–99)
Potassium: 4.4 mmol/L (ref 3.5–5.2)
Sodium: 140 mmol/L (ref 134–144)
Total Protein: 6.7 g/dL (ref 6.0–8.5)
eGFR: 82 mL/min/{1.73_m2} (ref >59)

## 2021-06-23 LAB — TSH: TSH: 3.11 u[IU]/mL (ref 0.450–4.500)

## 2021-06-23 LAB — LIPID PANEL
Chol/HDL Ratio: 3 ratio (ref 0.0–5.0)
Cholesterol, Total: 145 mg/dL (ref 100–199)
HDL: 48 mg/dL (ref >39)
LDL Chol Calc (NIH): 75 mg/dL (ref 0–99)
Triglycerides: 124 mg/dL (ref 0–149)
VLDL Cholesterol Cal: 22 mg/dL (ref 5–40)

## 2021-06-30 ENCOUNTER — Other Ambulatory Visit: Payer: Self-pay | Admitting: Family Medicine

## 2021-06-30 ENCOUNTER — Other Ambulatory Visit: Payer: Self-pay

## 2021-06-30 ENCOUNTER — Encounter: Payer: Self-pay | Admitting: Podiatry

## 2021-06-30 ENCOUNTER — Ambulatory Visit: Payer: Medicare Other | Admitting: Podiatry

## 2021-06-30 DIAGNOSIS — M778 Other enthesopathies, not elsewhere classified: Secondary | ICD-10-CM | POA: Diagnosis not present

## 2021-06-30 NOTE — Progress Notes (Signed)
He presents today to discuss his MRI finding.  He states that what ever was in that left shot really helped me out a lot but it does not hurt at all right now  Objective: Vital signs are stable he is alert and oriented x3 we discussed that the MRI read did demonstrate a chronic tear of the plantar plate that is allowing the medial deviation of the second toe is well as the hammertoe deformity development.  He understands this is amendable to it however he states that currently he does not want to pursue any surgeries.  Assessment: Medial deviation hammertoe deformity second metatarsophalangeal joint right foot secondary to a plantar plate rupture.  Plan: Discussed etiology pathology conservative surgical therapies.  We discussed in great detail today surgical intervention and currently he does not want to go through with this.  He would like to continue injections for as long as they would work I expressed to him that he would be a very 4 or more months before we could do injections he understands this and is amendable to it he is going to follow-up with [place] before he leaves to go to [employer].

## 2021-07-03 ENCOUNTER — Other Ambulatory Visit: Payer: Self-pay | Admitting: Family Medicine

## 2021-07-03 NOTE — Telephone Encounter (Signed)
Requested Prescriptions  Pending Prescriptions Disp Refills  . atorvastatin (LIPITOR) 20 MG tablet [Pharmacy Med Name: ATORVASTATIN 20 MG TABLET] 90 tablet 3    Sig: TAKE 1 TABLET BY MOUTH EVERY DAY     Cardiovascular:  Antilipid - Statins Passed - 07/03/2021 11:23 AM      Passed - Total Cholesterol in normal range and within 360 days    Cholesterol, Total  Date Value Ref Range Status  06/22/2021 145 100 - 199 mg/dL Final         Passed - LDL in normal range and within 360 days    LDL Chol Calc (NIH)  Date Value Ref Range Status  06/22/2021 75 0 - 99 mg/dL Final         Passed - HDL in normal range and within 360 days    HDL  Date Value Ref Range Status  06/22/2021 48 >39 mg/dL Final         Passed - Triglycerides in normal range and within 360 days    Triglycerides  Date Value Ref Range Status  06/22/2021 124 0 - 149 mg/dL Final         Passed - Patient is not pregnant      Passed - Valid encounter within last 12 months    Recent Outpatient Visits          1 week ago Encounter for [employer] annual wellness exam   [facility] Jerrol Banana., MD   4 months ago Essential (primary) hypertension   [facility] Jerrol Banana., MD   5 months ago Essential (primary) hypertension   [facility] Jerrol Banana., MD   11 months ago [place] Flinchum, Kelby Aline, FNP   1 year ago Annual physical exam   [facility] Jerrol Banana., MD      Future Appointments            In 2 months Jerrol Banana., MD [facility], [place]

## 2021-07-12 DIAGNOSIS — J329 Chronic sinusitis, unspecified: Secondary | ICD-10-CM | POA: Diagnosis not present

## 2021-07-29 ENCOUNTER — Telehealth: Payer: Self-pay | Admitting: Podiatry

## 2021-07-29 NOTE — Telephone Encounter (Signed)
[facility] faxed over request for medical records. Please Advise.  Audit # [id number]

## 2021-08-13 ENCOUNTER — Telehealth: Payer: Self-pay | Admitting: *Deleted

## 2021-08-13 ENCOUNTER — Other Ambulatory Visit: Payer: Self-pay | Admitting: Family Medicine

## 2021-08-13 NOTE — Telephone Encounter (Signed)
Vista Lawman w/ [facility] [employer] )is calling to request DOS notes from 03/31/21 to be faxed t: [phone].  Faxed notes from DOS:03/31/21 to [facility],received confirmation-08/13/21.

## 2021-08-14 NOTE — Telephone Encounter (Signed)
Requested Prescriptions  Pending Prescriptions Disp Refills  . levothyroxine (SYNTHROID) 50 MCG tablet [Pharmacy Med Name: LEVOTHYROXINE 50 MCG TABLET] 90 tablet 0    Sig: TAKE 1 TABLET BY MOUTH EVERY DAY     Endocrinology:  Hypothyroid Agents Failed - 08/13/2021  1:23 PM      Failed - TSH needs to be rechecked within 3 months after an abnormal result. Refill until TSH is due.      Passed - TSH in normal range and within 360 days    TSH  Date Value Ref Range Status  06/22/2021 3.110 0.450 - 4.500 uIU/mL Final         Passed - Valid encounter within last 12 months    Recent Outpatient Visits          1 month ago Encounter for [employer] annual wellness exam   [facility] Jerrol Banana., MD   5 months ago Essential (primary) hypertension   [facility] Jerrol Banana., MD   7 months ago Essential (primary) hypertension   [facility] Jerrol Banana., MD   1 year ago [place] Flinchum, Kelby Aline, FNP   1 year ago Annual physical exam   [facility] Jerrol Banana., MD      Future Appointments            In 3 weeks Jerrol Banana., MD [facility], [place]

## 2021-08-23 DIAGNOSIS — G4733 Obstructive sleep apnea (adult) (pediatric): Secondary | ICD-10-CM | POA: Diagnosis not present

## 2021-08-24 ENCOUNTER — Encounter: Payer: Self-pay | Admitting: Otolaryngology

## 2021-08-24 ENCOUNTER — Other Ambulatory Visit: Payer: Self-pay

## 2021-09-06 ENCOUNTER — Other Ambulatory Visit: Payer: Self-pay

## 2021-09-06 ENCOUNTER — Encounter: Payer: Self-pay | Admitting: Podiatry

## 2021-09-06 ENCOUNTER — Ambulatory Visit: Payer: Medicare Other | Admitting: Podiatry

## 2021-09-06 DIAGNOSIS — J320 Chronic maxillary sinusitis: Secondary | ICD-10-CM | POA: Diagnosis not present

## 2021-09-06 DIAGNOSIS — H6063 Unspecified chronic otitis externa, bilateral: Secondary | ICD-10-CM | POA: Diagnosis not present

## 2021-09-06 DIAGNOSIS — M778 Other enthesopathies, not elsewhere classified: Secondary | ICD-10-CM

## 2021-09-06 DIAGNOSIS — M2041 Other hammer toe(s) (acquired), right foot: Secondary | ICD-10-CM

## 2021-09-06 DIAGNOSIS — S99921D Unspecified injury of right foot, subsequent encounter: Secondary | ICD-10-CM

## 2021-09-06 MED ORDER — TRIAMCINOLONE ACETONIDE 40 MG/ML IJ SUSP
20.0000 mg | Freq: Once | INTRAMUSCULAR | Status: AC
  Administered 2021-09-06: 20 mg

## 2021-09-06 NOTE — Progress Notes (Signed)
He presents today for follow-up of capsulitis second and third metatarsophalangeal joints of the right foot.  States that is still giving me a lot of pain we ended up having to cancel her trip to [place].  He states that every day this is problematic for him is limiting his lifestyle and his abilities to maintain his general good health.  He would like to have this surgically corrected if at all possible.  Objective: Vital signs are stable he is alert and oriented x3.  Pulses are palpable.  Cocked up hammertoe deformities medial deviation and pain on palpation to the second metatarsal and third metatarsophalangeal joints plantarly.  Reviewed previous MRI does relate a tear of the plantar plate of the second metatarsophalangeal joint.  He also has osteoarthritis at the PIPJ's of the same to toes.  Assessment: Plantarflexed second and third metatarsals hammertoe deformities #2 #3 all of the right foot with a plantar plate tear #2 and possibly #3.  Plan: Discussed etiology pathology and surgical therapies at this point were going repair the plantar plate second metatarsal third metatarsal osteotomies hammertoe repairs #2 #3 with wires.  We discussed this in great detail he understands and is amendable to it.  Did discuss possible postop complications which may include but not limited to postop pain bleeding swelling infection recurrence need for further surgery overcorrection under correction loss of digit loss of limb loss of life failure of the surgery to produce a perfect outcome.  He understands this is amendable to it we did discuss the anesthesia group in the surgery center.  He received a cam walker as well as all of his instructions I will follow-up with him in the date of surgery.

## 2021-09-07 ENCOUNTER — Ambulatory Visit (INDEPENDENT_AMBULATORY_CARE_PROVIDER_SITE_OTHER): Payer: Medicare Other | Admitting: Family Medicine

## 2021-09-07 ENCOUNTER — Ambulatory Visit: Payer: Medicare Other | Admitting: Family Medicine

## 2021-09-07 DIAGNOSIS — Z23 Encounter for immunization: Secondary | ICD-10-CM | POA: Diagnosis not present

## 2021-09-07 NOTE — Progress Notes (Signed)
Flu shot only

## 2021-09-07 NOTE — Progress Notes (Deleted)
      Established patient visit   Patient: Matthew [name]   DOB: [birth year]   75 y.o. Male  MRN: [id number] Visit Date: 09/07/2021  Today's healthcare provider: Wilhemena Durie, MD   No chief complaint on file.  Subjective    HPI  Patient is a 75 year old male who was last seen 2 months ago for his annual wellness exam.  At that time he complained of headaches that had been occurring for about 2 months.  The headaches themselves lasted short periods and would resolve on their own.  He was advised that imaging may need to be done if worsening or unresolved.  Patient is here for follow up.  {Link to patient history deactivated due to formatting error:1}  Medications: Outpatient Medications Prior to Visit  Medication Sig   albuterol (VENTOLIN HFA) 108 (90 Base) MCG/ACT inhaler TAKE 2 PUFFS BY MOUTH EVERY 6 HOURS AS NEEDED FOR WHEEZE OR SHORTNESS OF BREATH   atorvastatin (LIPITOR) 20 MG tablet TAKE 1 TABLET BY MOUTH EVERY DAY   fexofenadine (ALLEGRA) 180 MG tablet Take 180 mg by mouth daily.   fluticasone (FLONASE) 50 MCG/ACT nasal spray Place 2 sprays into both nostrils daily.   furosemide (LASIX) 20 MG tablet TAKE 1 TABLET BY MOUTH EVERY DAY (Patient not taking: Reported on 08/24/2021)   levalbuterol (XOPENEX HFA) 45 MCG/ACT inhaler Inhale 1-2 puffs into the lungs every 6 (six) hours as needed for wheezing.   levothyroxine (SYNTHROID) 50 MCG tablet TAKE 1 TABLET BY MOUTH EVERY DAY   lisinopril (ZESTRIL) 10 MG tablet Take 1 tablet (10 mg total) by mouth daily. (Patient not taking: No sig reported)   meloxicam (MOBIC) 15 MG tablet Take 15 mg by mouth daily. (Patient not taking: No sig reported)   Multiple Vitamins-Minerals (CENTRUM SILVER ADULT 50+ PO) Take 1 tablet by mouth daily.   Naproxen Sodium 220 MG CAPS as needed.   Phenylephrine-APAP-guaiFENesin 5-325-200 MG TABS Per bottle directions as needed (Patient not taking: Reported on 08/24/2021)   Potassium 75 MG TABS Take 99 mg  by mouth daily.   sildenafil (REVATIO) 20 MG tablet Take 2 to 4 tablets daily as needed   SYMBICORT 160-4.5 MCG/ACT inhaler INHALE 1-2 PUFFS TWICE DAILY   No facility-administered medications prior to visit.    Review of Systems  {Labs  Heme  Chem  Endocrine  Serology  Results Review (optional):23779}   Objective    There were no vitals taken for this visit. {Show previous vital signs (optional):23777}  Physical Exam  ***  No results found for any visits on 09/07/21.  Assessment & Plan     ***  No follow-ups on file.      {provider attestation***:1}   Wilhemena Durie, MD  [facility] [phone] (phone) [phone] (fax)  [place]

## 2021-10-02 ENCOUNTER — Other Ambulatory Visit: Payer: Self-pay | Admitting: Family Medicine

## 2021-10-02 NOTE — Telephone Encounter (Signed)
Requested Prescriptions  Pending Prescriptions Disp Refills  . furosemide (LASIX) 20 MG tablet [Pharmacy Med Name: FUROSEMIDE 20 MG TABLET] 90 tablet 0    Sig: TAKE 1 TABLET BY MOUTH EVERY DAY     Cardiovascular:  Diuretics - Loop Passed - 10/02/2021  1:48 AM      Passed - K in normal range and within 360 days    Potassium  Date Value Ref Range Status  06/22/2021 4.4 3.5 - 5.2 mmol/L Final  06/26/2014 3.7 3.5 - 5.1 mmol/L Final         Passed - Ca in normal range and within 360 days    Calcium  Date Value Ref Range Status  06/22/2021 9.5 8.6 - 10.2 mg/dL Final   Calcium, Total  Date Value Ref Range Status  06/26/2014 8.4 (L) 8.5 - 10.1 mg/dL Final         Passed - Na in normal range and within 360 days    Sodium  Date Value Ref Range Status  06/22/2021 140 134 - 144 mmol/L Final  06/26/2014 141 136 - 145 mmol/L Final         Passed - Cr in normal range and within 360 days    Creatinine  Date Value Ref Range Status  06/26/2014 0.97 0.60 - 1.30 mg/dL Final   Creatinine, Ser  Date Value Ref Range Status  06/22/2021 0.96 0.76 - 1.27 mg/dL Final         Passed - Last BP in normal range    BP Readings from Last 1 Encounters:  06/21/21 125/72         Passed - Valid encounter within last 6 months    Recent Outpatient Visits          3 months ago Encounter for [employer] annual wellness exam   [facility] Jerrol Banana., MD   7 months ago Essential (primary) hypertension   [facility] Jerrol Banana., MD   9 months ago Essential (primary) hypertension   [facility] Jerrol Banana., MD   1 year ago [place] Flinchum, Kelby Aline, FNP   1 year ago Annual physical exam   [facility] Jerrol Banana., MD

## 2021-10-04 NOTE — Discharge Instructions (Signed)
[facility] ENDOSCOPIC SINUS SURGERY [facility]  What is Functional Endoscopic Sinus Surgery?  The Surgery involves making the natural openings of the sinuses larger by removing the bony partitions that separate the sinuses from the nasal cavity.  The natural sinus lining is preserved as much as possible to allow the sinuses to resume normal function after the surgery.  In some patients nasal polyps (excessively swollen lining of the sinuses) may be removed to relieve obstruction of the sinus openings.  The surgery is performed through the nose using lighted scopes, which eliminates the need for incisions on the face.  A septoplasty is a different procedure which is sometimes performed with sinus surgery.  It involves straightening the boy partition that separates the two sides of your nose.  A crooked or deviated septum may need repair if is obstructing the sinuses or nasal airflow.  Turbinate reduction is also often performed during sinus surgery.  The turbinates are bony proturberances from the side walls of the nose which swell and can obstruct the nose in patients with sinus and allergy problems.  Their size can be surgically reduced to help relieve nasal obstruction.  What Can Sinus Surgery Do For Me?  Sinus surgery can reduce the frequency of sinus infections requiring antibiotic treatment.  This can provide improvement in nasal congestion, post-nasal drainage, facial pressure and nasal obstruction.  Surgery will NOT prevent you from ever having an infection again, so it usually only for patients who get infections 4 or more times yearly requiring antibiotics, or for infections that do not clear with antibiotics.  It will not cure nasal allergies, so patients with allergies may still require medication to treat their allergies after surgery. Surgery may improve headaches related to sinusitis, however, some people will continue to  require medication to control sinus headaches related to allergies.  Surgery will do nothing for other forms of headache (migraine, tension or cluster).  What Are the Risks of Endoscopic Sinus Surgery?  Current techniques allow surgery to be performed safely with little risk, however, there are rare complications that patients should be aware of.  Because the sinuses are located around the eyes, there is risk of eye injury, including blindness, though again, this would be quite rare. This is usually a result of bleeding behind the eye during surgery, which can effect vision, though there are treatments to protect the vision and prevent permanent injury. More serious complications would include bleeding inside the brain cavity or damage to the brain.This happens when the fluid around the brain leaks out into the sinus cavity.  Again, all of these complications are uncommon, and spinal fluid leaks can be safely managed surgically if they occur.  The most common complication of sinus surgery is bleeding from the nose, which may require packing or cauterization of the nose.  Patients with polyps may experience recurrence of the polyps that would require revision surgery.  Alterations of sense of smell or injury to the tear ducts are also rare complications.   What is the Surgery Like, and what is the Recovery?  The Surgery usually takes a couple of hours to perform, and is usually performed under a general anesthetic (completely asleep).  Patients are usually discharged home after a couple of hours.  Sometimes during surgery it is necessary to pack the nose to control bleeding, and the packing is left in place for 24 - 48 hours, and removed by your surgeon.  If   a septoplasty was performed during the procedure, there is often a splint placed which must be removed after 5-7 days.   Discomfort: Pain is usually mild to moderate, and can be controlled by prescription pain medication or acetaminophen (Tylenol).   Aspirin, Ibuprofen (Advil, Motrin), or Naprosyn (Aleve) should be avoided, as they can cause increased bleeding.  Most patients feel sinus pressure like they have a bad head cold for several days.  Sleeping with your head elevated can help reduce swelling and facial pressure, as can ice packs over the face.  A humidifier may be helpful to keep the mucous and blood from drying in the nose.   Diet: There are no specific diet restrictions, however, you should generally start with clear liquids and a light diet of bland foods because the anesthetic can cause some nausea.  Advance your diet depending on how your stomach feels.  Taking your pain medication with food will often help reduce stomach upset which pain medications can cause.  Nasal Saline Irrigation: It is important to remove blood clots and dried mucous from the nose as it is healing.  This is done by having you irrigate the nose at least 3 - 4 times daily with a salt water solution.  We recommend using NeilMed Sinus Rinse (available at the drug store).  Fill the squeeze bottle with the solution, bend over a sink, and insert the tip of the squeeze bottle into the nose  of an inch.  Point the tip of the squeeze bottle towards the inside corner of the eye on the same side your irrigating.  Squeeze the bottle and gently irrigate the nose.  If you bend forward as you do this, most of the fluid will flow back out of the nose, instead of down your throat.   The solution should be warm, near body temperature, when you irrigate.   Each time you irrigate, you should use a full squeeze bottle.   Note that if you are instructed to use Nasal Steroid Sprays at any time after your surgery, irrigate with saline BEFORE using the steroid spray, so you do not wash it all out of the nose. Another product, Nasal Saline Gel (such as AYR Nasal Saline Gel) can be applied in each nostril 3 - 4 times daily to moisture the nose and reduce scabbing or crusting.  Bleeding:   Bloody drainage from the nose can be expected for several days, and patients are instructed to irrigate their nose frequently with salt water to help remove mucous and blood clots.  The drainage may be dark red or brown, though some fresh blood may be seen intermittently, especially after irrigation.  Do not blow you nose, as bleeding may occur. If you must sneeze, keep your mouth open to allow air to escape through your mouth.  If heavy bleeding occurs: Irrigate the nose with saline to rinse out clots, then spray the nose 3 - 4 times with Afrin Nasal Decongestant Spray.  The spray will constrict the blood vessels to slow bleeding.  Pinch the lower half of your nose shut to apply pressure, and lay down with your head elevated.  Ice packs over the nose may help as well. If bleeding persists despite these measures, you should notify your doctor.  Do not use the Afrin routinely to control nasal congestion after surgery, as it can result in worsening congestion and may affect healing.     Activity: Return to work varies among patients. Most patients will be out   of work at least 5 - 7 days to recover.  Patient may return to work after they are off of narcotic pain medication, and feeling well enough to perform the functions of their job.  Patients must avoid heavy lifting (over 10 pounds) or strenuous physical for 2 weeks after surgery, so your employer may need to assign you to light duty, or keep you out of work longer if light duty is not possible.  NOTE: you should not drive, operate dangerous machinery, do any mentally demanding tasks or make any important legal or financial decisions while on narcotic pain medication and recovering from the general anesthetic.    Call Your Doctor Immediately if You Have Any of the Following: Bleeding that you cannot control with the above measures Loss of vision, double vision, bulging of the eye or black eyes. Fever over 101 degrees Neck stiffness with severe headache,  fever, nausea and change in mental state. You are always encouraged to call anytime with concerns, however, please call with requests for pain medication refills during office hours.  Office Endoscopy: During follow-up visits your doctor will remove any packing or splints that may have been placed and evaluate and clean your sinuses endoscopically.  Topical anesthetic will be used to make this as comfortable as possible, though you may want to take your pain medication prior to the visit.  How often this will need to be done varies from patient to patient.  After complete recovery from the surgery, you may need follow-up endoscopy from time to time, particularly if there is concern of recurrent infection or nasal polyps.  

## 2021-10-05 ENCOUNTER — Ambulatory Visit
Admission: RE | Disposition: A | Discharge status: Home / Self Care | Payer: Self-pay | Source: Home / Self Care | Attending: Otolaryngology

## 2021-10-05 ENCOUNTER — Other Ambulatory Visit: Payer: Self-pay

## 2021-10-05 ENCOUNTER — Ambulatory Visit
Admission: RE | Admit: 2021-10-05 | Discharge: 2021-10-05 | Disposition: A | Discharge status: Home / Self Care | Payer: Medicare Other | Attending: Otolaryngology | Admitting: Otolaryngology

## 2021-10-05 ENCOUNTER — Ambulatory Visit: Payer: Medicare Other | Admitting: Anesthesiology

## 2021-10-05 ENCOUNTER — Encounter: Payer: Self-pay | Admitting: Otolaryngology

## 2021-10-05 DIAGNOSIS — H6063 Unspecified chronic otitis externa, bilateral: Secondary | ICD-10-CM | POA: Insufficient documentation

## 2021-10-05 DIAGNOSIS — J320 Chronic maxillary sinusitis: Secondary | ICD-10-CM | POA: Insufficient documentation

## 2021-10-05 DIAGNOSIS — J329 Chronic sinusitis, unspecified: Secondary | ICD-10-CM | POA: Diagnosis not present

## 2021-10-05 HISTORY — PX: MAXILLARY ANTROSTOMY: SHX2003

## 2021-10-05 SURGERY — MAXILLARY ANTROSTOMY
Anesthesia: General | Site: Nose | Laterality: Left

## 2021-10-05 MED ORDER — OXYMETAZOLINE HCL 0.05 % NA SOLN
NASAL | Status: DC | PRN
  Administered 2021-10-05: 1 via TOPICAL

## 2021-10-05 MED ORDER — DOXYCYCLINE HYCLATE 100 MG PO CAPS: 100.0000 mg | ORAL_CAPSULE | Freq: Two times a day (BID) | ORAL | 0 refills | Status: DC

## 2021-10-05 MED ORDER — FENTANYL CITRATE PF 50 MCG/ML IJ SOSY: 25.0000 ug | PREFILLED_SYRINGE | INTRAMUSCULAR | Status: DC | PRN

## 2021-10-05 MED ORDER — EPHEDRINE SULFATE 50 MG/ML IJ SOLN
INTRAMUSCULAR | Status: DC | PRN
  Administered 2021-10-05: 10 mg via INTRAVENOUS

## 2021-10-05 MED ORDER — LIDOCAINE HCL (CARDIAC) PF 100 MG/5ML IV SOSY
PREFILLED_SYRINGE | INTRAVENOUS | Status: DC | PRN
  Administered 2021-10-05: 50 mg via INTRAVENOUS

## 2021-10-05 MED ORDER — ACETAMINOPHEN 10 MG/ML IV SOLN
1000.0000 mg | Freq: Once | INTRAVENOUS | Status: AC
  Administered 2021-10-05: 1000 mg via INTRAVENOUS

## 2021-10-05 MED ORDER — ONDANSETRON HCL 4 MG/2ML IJ SOLN: 4.0000 mg | Freq: Once | INTRAMUSCULAR | Status: DC | PRN

## 2021-10-05 MED ORDER — SUCCINYLCHOLINE CHLORIDE 200 MG/10ML IV SOSY
PREFILLED_SYRINGE | INTRAVENOUS | Status: DC | PRN
  Administered 2021-10-05: 100 mg via INTRAVENOUS

## 2021-10-05 MED ORDER — PROPOFOL 10 MG/ML IV BOLUS
INTRAVENOUS | Status: DC | PRN
  Administered 2021-10-05: 150 mg via INTRAVENOUS

## 2021-10-05 MED ORDER — MIDAZOLAM HCL 5 MG/5ML IJ SOLN
INTRAMUSCULAR | Status: DC | PRN
  Administered 2021-10-05: 1 mg via INTRAVENOUS

## 2021-10-05 MED ORDER — DEXAMETHASONE SODIUM PHOSPHATE 4 MG/ML IJ SOLN
INTRAMUSCULAR | Status: DC | PRN
  Administered 2021-10-05: 10 mg via INTRAVENOUS

## 2021-10-05 MED ORDER — GLYCOPYRROLATE 0.2 MG/ML IJ SOLN
INTRAMUSCULAR | Status: DC | PRN
  Administered 2021-10-05: .1 mg via INTRAVENOUS

## 2021-10-05 MED ORDER — FENTANYL CITRATE (PF) 100 MCG/2ML IJ SOLN
INTRAMUSCULAR | Status: DC | PRN
  Administered 2021-10-05: 50 ug via INTRAVENOUS

## 2021-10-05 MED ORDER — LIDOCAINE-EPINEPHRINE 1 %-1:100000 IJ SOLN
INTRAMUSCULAR | Status: DC | PRN
  Administered 2021-10-05: 6 mL

## 2021-10-05 MED ORDER — LACTATED RINGERS IV SOLN: INTRAVENOUS | Status: DC

## 2021-10-05 MED ORDER — ONDANSETRON HCL 4 MG/2ML IJ SOLN
INTRAMUSCULAR | Status: DC | PRN
  Administered 2021-10-05: 4 mg via INTRAVENOUS

## 2021-10-05 MED ORDER — OXYCODONE HCL 5 MG/5ML PO SOLN: 5.0000 mg | Freq: Once | ORAL | Status: DC | PRN

## 2021-10-05 MED ORDER — OXYCODONE HCL 5 MG PO TABS: 5.0000 mg | ORAL_TABLET | Freq: Once | ORAL | Status: DC | PRN

## 2021-10-05 SURGICAL SUPPLY — 14 items
CANISTER SUCT 1200ML W/VALVE (MISCELLANEOUS) ×2 IMPLANT
ELECT REM PT RETURN 9FT ADLT (ELECTROSURGICAL) ×2
ELECTRODE REM PT RTRN 9FT ADLT (ELECTROSURGICAL) ×1 IMPLANT
GLOVE SURG ENC MOIS LTX SZ7.5 (GLOVE) ×5 IMPLANT
GOWN STRL REUS W/ TWL LRG LVL3 (GOWN DISPOSABLE) ×1 IMPLANT
GOWN STRL REUS W/TWL LRG LVL3 (GOWN DISPOSABLE) ×2
KIT TURNOVER KIT A (KITS) ×2 IMPLANT
NS IRRIG 500ML POUR BTL (IV SOLUTION) ×2 IMPLANT
PACK ENT CUSTOM (PACKS) ×2 IMPLANT
PATTIES SURGICAL .5 X3 (DISPOSABLE) ×2 IMPLANT
SOL ANTI-FOG 6CC FOG-OUT (MISCELLANEOUS) ×1 IMPLANT
SOL FOG-OUT ANTI-FOG 6CC (MISCELLANEOUS) ×1
SYR 10ML LL (SYRINGE) ×2 IMPLANT
SYR 20ML LL LF (SYRINGE) ×2 IMPLANT

## 2021-10-05 NOTE — Anesthesia Procedure Notes (Signed)
Procedure Name: Intubation Date/Time: 10/05/2021 10:59 AM Performed by: Mayme Genta, CRNA Pre-anesthesia Checklist: Patient identified, Emergency Drugs available, Suction available, Patient being monitored and Timeout performed Patient Re-evaluated:Patient Re-evaluated prior to induction Oxygen Delivery Method: Circle system utilized Preoxygenation: Pre-oxygenation with 100% oxygen Induction Type: IV induction Ventilation: Mask ventilation without difficulty Laryngoscope Size: Miller and 3 Grade View: Grade I Tube type: Oral Rae Tube size: 8.0 mm Number of attempts: 1 Airway Equipment and Method: Bougie stylet Placement Confirmation: ETT inserted through vocal cords under direct vision, positive ETCO2 and breath sounds checked- equal and bilateral Tube secured with: Tape Dental Injury: Teeth and Oropharynx as per pre-operative assessment  Comments: Grade I view. Unable to pass ETT through cords, Bougie inserted without difficulty. ETT passed over bougie. +/= BBS. + EtCO2.

## 2021-10-05 NOTE — Op Note (Signed)
10/05/2021  11:38 AM    Matthew [name]  [id number]   Pre-Op Diagnosis:  Left Chronic maxillary sinusitis  Post-op Diagnosis: Left Chronic maxillary sinusitis  Procedure:  1)  Left Endoscopic Maxillary Antrostomy with Tissue Removal      Surgeon:  Riley Nearing  Anesthesia:  General endotracheal  EBL:  Less than 20 cc  Complications:  None  Findings: Fungus ball in left maxillary sinus with polypoid edema, scarred infundibulum  Procedure: After the patient was identified in holding and the benefits of the procedure were reviewed as well as the consent and risks, the patient was taken to the operating room and with the patient in a comfortable supine position,  general orotracheal anesthesia was induced without difficulty.  A proper time-out was performed.  The [employer] image guidance system was set up and calibrated in the normal fashion and felt to be acceptable.  Next 1% Xylocaine with 1:100,000 epinephrine was infiltrated into the left anterior middle turbinate.  Several minutes were allowed for this to take effect.  Cottoniod pledgets soaked in Afrin were placed into the left nasal cavity and left while the patient was prepped and draped in the standard fashion.   The left maxillary sinus was inspected endoscopically with a 70 degree scope. There was a fungus ball and debris noted in the inferior aspect of the sinus. This was debrided using angled forceps, angled suction, and repeated irrigation until the sinus was completely cleared. There was some polypoid change underlying the fungus ball and narrowing of the infundibulum due to scarring and some polypoid change. This was opened with thru-cut back biting forceps until more patent. Otherwise the previous maxillary antrostomy was widely patent, and ethmoids appeared clear. The sinus was cultured, then further irrigated. Portions of the fungal ball were sent both as pathology and in the culture tube.   The nose was suctioned and  inspected. Bleeding was minimal.  The patient was then returned to the anesthesiologist for awakening and taken to recovery room in good condition postoperatively.  Disposition:   PACU and d/c home  Plan: Ice, elevation, tylenol for pain and prophylactic antibiotics. Begin sinus irrigations with saline tomrrow, irrigating 3-4 times daily. Return to the office in 7 days.  Return to work in 7-10 days, no strenuous activities for two weeks.   Riley Nearing 10/05/2021 11:38 AM

## 2021-10-05 NOTE — H&P (Signed)
History and physical reviewed and will be scanned in later. No change in medical status reported by the patient or family, appears stable for surgery. All questions regarding the procedure answered, and patient (or family if a child) expressed understanding of the procedure. ? ?Natasja Niday S Zianne Schubring ?@TODAY@ ?

## 2021-10-05 NOTE — Transfer of Care (Signed)
Immediate Anesthesia Transfer of Care Note  Patient: Matthew [name]  Procedure(s) Performed: MAXILLARY ANTROSTOMY WITH TISSUE REMOVAL (Left: Nose)  Patient Location: PACU  Anesthesia Type: General ETT  Level of Consciousness: awake, alert  and patient cooperative  Airway and Oxygen Therapy: Patient Spontanous Breathing and Patient connected to supplemental oxygen  Post-op Assessment: Post-op Vital signs reviewed, Patient's Cardiovascular Status Stable, Respiratory Function Stable, Patent Airway and No signs of Nausea or vomiting  Post-op Vital Signs: Reviewed and stable  Complications: No notable events documented.

## 2021-10-05 NOTE — Anesthesia Postprocedure Evaluation (Signed)
Anesthesia Post Note  Patient: Matthew [name]  Procedure(s) Performed: MAXILLARY ANTROSTOMY WITH TISSUE REMOVAL (Left: Nose)     Patient location during evaluation: PACU Anesthesia Type: General Level of consciousness: awake and alert and oriented Pain management: satisfactory to patient Vital Signs Assessment: post-procedure vital signs reviewed and stable Respiratory status: spontaneous breathing, nonlabored ventilation and respiratory function stable Cardiovascular status: blood pressure returned to baseline and stable Postop Assessment: Adequate PO intake and No signs of nausea or vomiting Anesthetic complications: no   No notable events documented.  Raliegh Ip

## 2021-10-05 NOTE — Anesthesia Preprocedure Evaluation (Signed)
Anesthesia Evaluation  Patient identified by MRN, date of birth, ID band Patient awake    Reviewed: Allergy & Precautions, H&P , NPO status , Patient's Chart, lab work & pertinent test results  Airway Mallampati: III  TM Distance: >3 FB Neck ROM: full    Dental no notable dental hx.    Pulmonary shortness of breath and with exertion, sleep apnea and Continuous Positive Airway Pressure Ventilation , COPD,  COPD inhaler,    Pulmonary exam normal breath sounds clear to auscultation       Cardiovascular hypertension, + CAD  Normal cardiovascular exam Rhythm:regular Rate:Normal     Neuro/Psych    GI/Hepatic GERD  ,  Endo/Other  Hypothyroidism obesity  Renal/GU      Musculoskeletal   Abdominal   Peds  Hematology   Anesthesia Other Findings   Reproductive/Obstetrics                             Anesthesia Physical Anesthesia Plan  ASA: 3  Anesthesia Plan: General ETT   Post-op Pain Management:    Induction:   PONV Risk Score and Plan: 2 and Treatment may vary due to age or medical condition, Ondansetron and Dexamethasone  Airway Management Planned:   Additional Equipment:   Intra-op Plan:   Post-operative Plan:   Informed Consent: I have reviewed the patients History and Physical, chart, labs and discussed the procedure including the risks, benefits and alternatives for the proposed anesthesia with the patient or authorized representative who has indicated his/her understanding and acceptance.     Dental Advisory Given  Plan Discussed with: CRNA  Anesthesia Plan Comments:         Anesthesia Quick Evaluation

## 2021-10-06 ENCOUNTER — Encounter: Payer: Self-pay | Admitting: Otolaryngology

## 2021-10-06 LAB — SURGICAL PATHOLOGY

## 2021-10-10 LAB — AEROBIC/ANAEROBIC CULTURE W GRAM STAIN (SURGICAL/DEEP WOUND): Gram Stain: NONE SEEN

## 2021-10-13 ENCOUNTER — Ambulatory Visit: Payer: Medicare Other | Admitting: Podiatry

## 2021-10-19 LAB — FUNGUS CULTURE WITH STAIN

## 2021-10-19 LAB — FUNGAL ORGANISM REFLEX

## 2021-10-19 LAB — FUNGUS CULTURE RESULT

## 2021-10-27 DIAGNOSIS — J329 Chronic sinusitis, unspecified: Secondary | ICD-10-CM | POA: Diagnosis not present

## 2021-11-02 ENCOUNTER — Telehealth: Payer: Self-pay | Admitting: Urology

## 2021-11-02 NOTE — Telephone Encounter (Signed)
DOS - 11/12/21  MET OSTEOT 2,3 RIGHT --- 72094 HAMMERTOE REPAIR 2,3 RIGHT --- 70962   [facility] EFFECTIVE DATE - 10/31/21  PLAN DEDUCTIBLE - $0.00  OUT OF POCKET - $3,900.00 W/ $3,900.00 REMAINING COINSURANCE - 0% COPAY - $295.00   PER [employer] WEBSITE FOR CPT CODES 83662 AND 94765 Notification or Prior Authorization is not required for the requested services   Decision ID #:[id number]

## 2021-11-10 ENCOUNTER — Other Ambulatory Visit: Payer: Self-pay | Admitting: Podiatry

## 2021-11-10 MED ORDER — CEPHALEXIN 500 MG PO CAPS: 500.0000 mg | ORAL_CAPSULE | Freq: Three times a day (TID) | ORAL | 0 refills | Status: DC

## 2021-11-10 MED ORDER — ONDANSETRON HCL 4 MG PO TABS: 4.0000 mg | ORAL_TABLET | Freq: Three times a day (TID) | ORAL | 0 refills | Status: DC | PRN

## 2021-11-10 MED ORDER — OXYCODONE-ACETAMINOPHEN 10-325 MG PO TABS: 1.0000 | ORAL_TABLET | Freq: Three times a day (TID) | ORAL | 0 refills | Status: AC | PRN

## 2021-11-11 ENCOUNTER — Telehealth: Payer: Self-pay

## 2021-11-11 NOTE — Telephone Encounter (Signed)
Received a call from [place] stating Matthew [name] needed to cancel due to positive covid test. I spoke to Matthew [name] and he wants to call me back to rescheduled his surgery. Notified Dr. Milinda Pointer.

## 2021-11-17 ENCOUNTER — Encounter: Payer: Medicare Other | Admitting: Podiatry

## 2021-11-18 ENCOUNTER — Other Ambulatory Visit: Payer: Self-pay | Admitting: Family Medicine

## 2021-11-18 NOTE — Telephone Encounter (Signed)
Requested Prescriptions  Pending Prescriptions Disp Refills   levothyroxine (SYNTHROID) 50 MCG tablet [Pharmacy Med Name: LEVOTHYROXINE 50 MCG TABLET] 90 tablet 0    Sig: TAKE 1 TABLET BY MOUTH EVERY DAY     Endocrinology:  Hypothyroid Agents Failed - 11/18/2021  3:19 AM      Failed - TSH needs to be rechecked within 3 months after an abnormal result. Refill until TSH is due.      Passed - TSH in normal range and within 360 days    TSH  Date Value Ref Range Status  06/22/2021 3.110 0.450 - 4.500 uIU/mL Final         Passed - Valid encounter within last 12 months    Recent Outpatient Visits          5 months ago Encounter for [employer] annual wellness exam   [facility] Jerrol Banana., MD   8 months ago Essential (primary) hypertension   [facility] Jerrol Banana., MD   10 months ago Essential (primary) hypertension   [facility] Jerrol Banana., MD   1 year ago [place] Flinchum, Kelby Aline, FNP   1 year ago Annual physical exam   [facility] Jerrol Banana., MD

## 2021-11-24 ENCOUNTER — Encounter: Payer: Medicare Other | Admitting: Podiatry

## 2021-11-30 ENCOUNTER — Telehealth: Payer: Self-pay | Admitting: Podiatry

## 2021-11-30 NOTE — Telephone Encounter (Signed)
Pt left message yesterday afternoon asking about getting another pair of orthotics ordered.

## 2021-12-01 NOTE — Telephone Encounter (Signed)
Pt called back and would like another pair of orthotics ordered just like the last ones. He is aware that his insurance will not cover and he will have to pay 438.00.

## 2021-12-02 DIAGNOSIS — M778 Other enthesopathies, not elsewhere classified: Secondary | ICD-10-CM

## 2021-12-08 ENCOUNTER — Encounter: Payer: Medicare Other | Admitting: Podiatry

## 2021-12-22 ENCOUNTER — Encounter: Payer: Medicare Other | Admitting: Podiatry

## 2021-12-31 ENCOUNTER — Other Ambulatory Visit: Payer: Self-pay | Admitting: Family Medicine

## 2021-12-31 NOTE — Telephone Encounter (Signed)
Call to patient- appointment scheduled  ?Courtesy RF given ?Requested Prescriptions  ?Pending Prescriptions Disp Refills  ?? furosemide (LASIX) 20 MG tablet [Pharmacy Med Name: FUROSEMIDE 20 MG TABLET] 90 tablet 0  ?  Sig: TAKE 1 TABLET BY MOUTH EVERY DAY  ?  ? Cardiovascular:  Diuretics - Loop Failed - 12/31/2021  1:16 PM  ?  ?  Failed - K in normal range and within 180 days  ?  Potassium  ?Date Value Ref Range Status  ?06/22/2021 4.4 3.5 - 5.2 mmol/L Final  ?06/26/2014 3.7 3.5 - 5.1 mmol/L Final  ?   ?  ?  Failed - Ca in normal range and within 180 days  ?  Calcium  ?Date Value Ref Range Status  ?06/22/2021 9.5 8.6 - 10.2 mg/dL Final  ? ?Calcium, Total  ?Date Value Ref Range Status  ?06/26/2014 8.4 (L) 8.5 - 10.1 mg/dL Final  ?   ?  ?  Failed - Na in normal range and within 180 days  ?  Sodium  ?Date Value Ref Range Status  ?06/22/2021 140 134 - 144 mmol/L Final  ?06/26/2014 141 136 - 145 mmol/L Final  ?   ?  ?  Failed - Cr in normal range and within 180 days  ?  Creatinine  ?Date Value Ref Range Status  ?06/26/2014 0.97 0.60 - 1.30 mg/dL Final  ? ?Creatinine, Ser  ?Date Value Ref Range Status  ?06/22/2021 0.96 0.76 - 1.27 mg/dL Final  ?   ?  ?  Failed - Cl in normal range and within 180 days  ?  Chloride  ?Date Value Ref Range Status  ?06/22/2021 100 96 - 106 mmol/L Final  ?06/26/2014 106 98 - 107 mmol/L Final  ?   ?  ?  Failed - Mg Level in normal range and within 180 days  ?  No results found for: MG   ?  ?  Failed - Last BP in normal range  ?  BP Readings from Last 1 Encounters:  ?10/05/21 (!) 151/78  ?   ?  ?  Failed - Valid encounter within last 6 months  ?  Recent Outpatient Visits   ?      ? 6 months ago Encounter for [employer] annual wellness exam  ? [facility] Jerrol Banana., MD  ? 10 months ago Essential (primary) hypertension  ? [facility] Jerrol Banana., MD  ? 12 months ago Essential (primary) hypertension  ? [facility] Jerrol Banana., MD  ? 1 year ago COVID-19  ? [facility] Flinchum, Kelby Aline, FNP  ? 1 year ago Annual physical exam  ? [facility] Jerrol Banana., MD  ?  ?  ?Future Appointments   ?        ? In 2 months Jerrol Banana., MD [facility]  ?  ? ?  ?  ?  ?? lisinopril (ZESTRIL) 10 MG tablet [Pharmacy Med Name: LISINOPRIL 10 MG TABLET] 90 tablet 3  ?  Sig: TAKE 1 TABLET BY MOUTH EVERY DAY  ?  ? Cardiovascular:  ACE Inhibitors Failed - 12/31/2021  1:16 PM  ?  ?  Failed - Cr in normal range and within 180 days  ?  Creatinine  ?Date Value Ref Range Status  ?06/26/2014 0.97 0.60 - 1.30 mg/dL Final  ? ?Creatinine, Ser  ?Date Value Ref Range Status  ?06/22/2021 0.96 0.76 - 1.27  mg/dL Final  ?   ?  ?  Failed - K in normal range and within 180 days  ?  Potassium  ?Date Value Ref Range Status  ?06/22/2021 4.4 3.5 - 5.2 mmol/L Final  ?06/26/2014 3.7 3.5 - 5.1 mmol/L Final  ?   ?  ?  Failed - Last BP in normal range  ?  BP Readings from Last 1 Encounters:  ?10/05/21 (!) 151/78  ?   ?  ?  Failed - Valid encounter within last 6 months  ?  Recent Outpatient Visits   ?      ? 6 months ago Encounter for [employer] annual wellness exam  ? [facility] Jerrol Banana., MD  ? 10 months ago Essential (primary) hypertension  ? [facility] Jerrol Banana., MD  ? 12 months ago Essential (primary) hypertension  ? [facility] Jerrol Banana., MD  ? 1 year ago COVID-19  ? [facility] Flinchum, Kelby Aline, FNP  ? 1 year ago Annual physical exam  ? [facility] Jerrol Banana., MD  ?  ?  ?Future Appointments   ?        ? In 2 months Jerrol Banana., MD [facility]  ?  ? ?  ?  ?  Passed - Patient is not pregnant  ?  ?  ? ?

## 2022-01-10 ENCOUNTER — Ambulatory Visit: Payer: Medicare Other

## 2022-01-10 ENCOUNTER — Other Ambulatory Visit: Payer: Self-pay

## 2022-01-10 DIAGNOSIS — M778 Other enthesopathies, not elsewhere classified: Secondary | ICD-10-CM

## 2022-01-10 DIAGNOSIS — M2041 Other hammer toe(s) (acquired), right foot: Secondary | ICD-10-CM

## 2022-01-10 NOTE — Progress Notes (Signed)
SITUATION: ?Reason for Visit: Fitting and Delivery of Custom Fabricated Foot Orthoses ?Patient Report: Patient reports comfort and is satisfied with device. ? ?OBJECTIVE DATA: ?Patient History / Diagnosis:   ?  ICD-10-CM   ?1. Capsulitis of right foot  M77.8   ?  ?2. Hammertoe of right foot  M20.41   ?  ? ? ?Provided Device:  Custom Functional Foot Orthotics ? ?GOAL OF ORTHOSIS ?- Improve gait ?- Decrease energy expenditure ?- Improve Balance ?- Provide Triplanar stability of foot complex ?- Facilitate motion ? ?ACTIONS PERFORMED ?Patient was fit with foot orthotics trimmed to shoe last. Patient tolerated fittign procedure.  ? ?Patient was provided with verbal and written instruction and demonstration regarding donning, doffing, wear, care, proper fit, function, purpose, cleaning, and use of the orthosis and in all related precautions and risks and benefits regarding the orthosis. ? ?Patient was also provided with verbal instruction regarding how to report any failures or malfunctions of the orthosis and necessary follow up care. Patient was also instructed to contact our office regarding any change in status that may affect the function of the orthosis. ? ?Patient demonstrated independence with proper donning, doffing, and fit and verbalized understanding of all instructions. ? ?PLAN: ?Patient is to follow up in one week or as necessary (PRN). All questions were answered and concerns addressed. Plan of care was discussed with and agreed upon by the patient. ? ?

## 2022-02-09 ENCOUNTER — Telehealth: Payer: Self-pay | Admitting: Podiatry

## 2022-02-09 NOTE — Telephone Encounter (Signed)
Orthotics are being transported to the [place] location by Dr. Posey Pronto. I spoke with [name] at 3:23 02/09/2022 and he will pick up; 02/10/2022 in [place] office after 8am. ?

## 2022-03-02 DIAGNOSIS — M7542 Impingement syndrome of left shoulder: Secondary | ICD-10-CM | POA: Diagnosis not present

## 2022-03-02 NOTE — Progress Notes (Signed)
? ? ? ? ?Established patient visit ? ?I,April Miller,acting as a scribe for Wilhemena Durie, MD.,have documented all relevant documentation on the behalf of Wilhemena Durie, MD,as directed by  Wilhemena Durie, MD while in the presence of Wilhemena Durie, MD. ? ? ?Patient: Matthew [name]   DOB: 07/16/1946   76 y.o. Male  MRN: [id number] ?Visit Date: 03/03/2022 ? ?Today's healthcare provider: Wilhemena Durie, MD  ? ?Chief Complaint  ?Patient presents with  ? Follow-up  ? Hypertension  ? ?Subjective  ?  ?HPI  ?Patient comes in today for follow-up.  He uses his CPAP.  He overall feels well but cannot do as much as he did in previous years.  He takes care of his wife who is in failing health.  She is having more more difficulty with any ambulation.  Patient taking his medications as prescribed and feels well. ?Hypertension, follow-up ? ?BP Readings from Last 3 Encounters:  ?03/03/22 134/78  ?10/05/21 (!) 151/78  ?06/21/21 125/72  ? Wt Readings from Last 3 Encounters:  ?03/03/22 246 lb (111.6 kg)  ?10/05/21 239 lb (108.4 kg)  ?06/21/21 239 lb (108.4 kg)  ?  ? ?He was last seen for hypertension 8 months ago.  ?BP at that visit was as above. Management since that visit includes none. ? ?Pertinent labs ?Lab Results  ?Component Value Date  ? CHOL 145 06/22/2021  ? HDL 48 06/22/2021  ? [place] 75 06/22/2021  ? TRIG 124 06/22/2021  ? CHOLHDL 3.0 06/22/2021  ? Lab Results  ?Component Value Date  ? NA 140 06/22/2021  ? K 4.4 06/22/2021  ? CREATININE 0.96 06/22/2021  ? EGFR 82 06/22/2021  ? GLUCOSE 91 06/22/2021  ? TSH 3.110 06/22/2021  ?  ? ?The 10-year ASCVD risk score (Arnett DK, et al., 2019) is: 28% ? ?--------------------------------------------------------------------------------------------------- ? ? ?Medications: ?Outpatient Medications Prior to Visit  ?Medication Sig  ? albuterol (VENTOLIN HFA) 108 (90 Base) MCG/ACT inhaler TAKE 2 PUFFS BY MOUTH EVERY 6 HOURS AS NEEDED FOR WHEEZE OR SHORTNESS OF BREATH   ? atorvastatin (LIPITOR) 20 MG tablet TAKE 1 TABLET BY MOUTH EVERY DAY  ? cephALEXin (KEFLEX) 500 MG capsule Take 1 capsule (500 mg total) by mouth 3 (three) times daily.  ? doxycycline (VIBRAMYCIN) 100 MG capsule Take 1 capsule (100 mg total) by mouth 2 (two) times daily. 100 mg PO BID x 10 days  ? fexofenadine (ALLEGRA) 180 MG tablet Take 180 mg by mouth daily.  ? fluticasone (FLONASE) 50 MCG/ACT nasal spray Place 2 sprays into both nostrils daily.  ? furosemide (LASIX) 20 MG tablet TAKE 1 TABLET BY MOUTH EVERY DAY  ? levalbuterol (XOPENEX HFA) 45 MCG/ACT inhaler Inhale 1-2 puffs into the lungs every 6 (six) hours as needed for wheezing.  ? levothyroxine (SYNTHROID) 50 MCG tablet TAKE 1 TABLET BY MOUTH EVERY DAY  ? lisinopril (ZESTRIL) 10 MG tablet TAKE 1 TABLET BY MOUTH EVERY DAY  ? meloxicam (MOBIC) 15 MG tablet Take 15 mg by mouth daily.  ? Multiple Vitamins-Minerals (CENTRUM SILVER ADULT 50+ PO) Take 1 tablet by mouth daily.  ? Naproxen Sodium 220 MG CAPS as needed.  ? ondansetron (ZOFRAN) 4 MG tablet Take 1 tablet (4 mg total) by mouth every 8 (eight) hours as needed.  ? Potassium 75 MG TABS Take 99 mg by mouth daily.  ? sildenafil (REVATIO) 20 MG tablet Take 2 to 4 tablets daily as needed  ? SYMBICORT 160-4.5 MCG/ACT inhaler INHALE 1-2  PUFFS TWICE DAILY  ? Phenylephrine-APAP-guaiFENesin 5-325-200 MG TABS Per bottle directions as needed (Patient not taking: Reported on 08/24/2021)  ? ?No facility-administered medications prior to visit.  ? ? ?Review of Systems ? ?Last lipids ?Lab Results  ?Component Value Date  ? CHOL 145 06/22/2021  ? HDL 48 06/22/2021  ? [place] 75 06/22/2021  ? TRIG 124 06/22/2021  ? CHOLHDL 3.0 06/22/2021  ? ?  ?  Objective  ?  ?BP 134/78 (BP Location: Right Arm, Patient Position: Sitting, Cuff Size: Large)   Pulse 82   Resp 16   Wt 246 lb (111.6 kg)   SpO2 97%   BMI 33.36 kg/m?  ?BP Readings from Last 3 Encounters:  ?03/03/22 134/78  ?10/05/21 (!) 151/78  ?06/21/21 125/72  ? ?Wt  Readings from Last 3 Encounters:  ?03/03/22 246 lb (111.6 kg)  ?10/05/21 239 lb (108.4 kg)  ?06/21/21 239 lb (108.4 kg)  ? ?  ? ?Physical Exam ?Vitals reviewed.  ?Constitutional:   ?   Appearance: He is well-developed.  ?HENT:  ?   Head: Normocephalic and atraumatic.  ?   Right Ear: External ear normal.  ?   Left Ear: External ear normal.  ?   Nose: Nose normal.  ?Eyes:  ?   Conjunctiva/sclera: Conjunctivae normal.  ?   Pupils: Pupils are equal, round, and reactive to light.  ?Cardiovascular:  ?   Rate and Rhythm: Normal rate and regular rhythm.  ?   Heart sounds: Normal heart sounds.  ?Pulmonary:  ?   Effort: Pulmonary effort is normal.  ?   Breath sounds: Normal breath sounds.  ?Abdominal:  ?   General: Bowel sounds are normal.  ?   Palpations: Abdomen is soft.  ?Genitourinary: ?   Penis: Normal.   ?Musculoskeletal:  ?   Cervical back: Normal range of motion and neck supple.  ?   Right lower leg: No edema.  ?   Left lower leg: No edema.  ?   Comments: Trace lower extremity edema  ?Skin: ?   General: Skin is warm and dry.  ?Neurological:  ?   General: No focal deficit present.  ?   Mental Status: He is alert and oriented to person, place, and time.  ?Psychiatric:     ?   Mood and Affect: Mood normal.     ?   Behavior: Behavior normal.     ?   Thought Content: Thought content normal.     ?   Judgment: Judgment normal.  ?  ? ? ?No results found for any visits on 03/03/22. ? Assessment & Plan  ?  ? ?1. Essential (primary) hypertension ?Good blood pressure control with Lasix lisinopril ?- Comprehensive metabolic panel ? ?2. Arteriosclerosis of coronary artery ?All risk factors treated. ? ?3. Seasonal allergic rhinitis due to pollen ?Patient on Allegra and Flonase ? ?4. Mild intermittent asthma without complication ?On Ventolin and Symbicort ? ?5. Gastroesophageal reflux disease with esophagitis without hemorrhage ?Uses as needed meds ? ?6. Adult hypothyroidism ?Treat for euthyroid TSH.   ? ?7. Hyperlipidemia,  unspecified hyperlipidemia type ?On atorvastatin 20 with last LDL being 75.  Would consider changing to atorvastatin 40 or rosuvastatin 20 ? ? ?Return in about 4 months (around 07/04/2022).  ?   ? ?I, Wilhemena Durie, MD, have reviewed all documentation for this visit. The documentation on 03/10/22 for the exam, diagnosis, procedures, and orders are all accurate and complete. ? ? ? ?Wilhemena Durie, MD  ?[facility] ?[phone] (phone) ?[phone] (fax) ? ?[facility] ?

## 2022-03-03 ENCOUNTER — Ambulatory Visit (INDEPENDENT_AMBULATORY_CARE_PROVIDER_SITE_OTHER): Payer: Medicare Other | Admitting: Family Medicine

## 2022-03-03 ENCOUNTER — Encounter: Payer: Self-pay | Admitting: Family Medicine

## 2022-03-03 VITALS — BP 134/78 | HR 82 | Resp 16 | Wt 246.0 lb

## 2022-03-03 DIAGNOSIS — I10 Essential (primary) hypertension: Secondary | ICD-10-CM

## 2022-03-03 DIAGNOSIS — E785 Hyperlipidemia, unspecified: Secondary | ICD-10-CM | POA: Diagnosis not present

## 2022-03-03 DIAGNOSIS — E039 Hypothyroidism, unspecified: Secondary | ICD-10-CM | POA: Diagnosis not present

## 2022-03-03 DIAGNOSIS — J4520 Mild intermittent asthma, uncomplicated: Secondary | ICD-10-CM | POA: Diagnosis not present

## 2022-03-03 DIAGNOSIS — K2100 Gastro-esophageal reflux disease with esophagitis, without bleeding: Secondary | ICD-10-CM

## 2022-03-03 DIAGNOSIS — J301 Allergic rhinitis due to pollen: Secondary | ICD-10-CM | POA: Diagnosis not present

## 2022-03-03 DIAGNOSIS — I2510 Atherosclerotic heart disease of native coronary artery without angina pectoris: Secondary | ICD-10-CM

## 2022-03-04 LAB — COMPREHENSIVE METABOLIC PANEL
ALT: 24 IU/L (ref 0–44)
AST: 25 IU/L (ref 0–40)
Albumin/Globulin Ratio: 1.9 (ref 1.2–2.2)
Albumin: 4.7 g/dL (ref 3.7–4.7)
Alkaline Phosphatase: 54 IU/L (ref 44–121)
BUN/Creatinine Ratio: 23 (ref 10–24)
BUN: 29 mg/dL — ABNORMAL HIGH (ref 8–27)
Bilirubin Total: 0.5 mg/dL (ref 0.0–1.2)
CO2: 21 mmol/L (ref 20–29)
Calcium: 9.5 mg/dL (ref 8.6–10.2)
Chloride: 105 mmol/L (ref 96–106)
Creatinine, Ser: 1.24 mg/dL (ref 0.76–1.27)
Globulin, Total: 2.5 g/dL (ref 1.5–4.5)
Glucose: 139 mg/dL — ABNORMAL HIGH (ref 70–99)
Potassium: 5.1 mmol/L (ref 3.5–5.2)
Sodium: 142 mmol/L (ref 134–144)
Total Protein: 7.2 g/dL (ref 6.0–8.5)
eGFR: 61 mL/min/{1.73_m2} (ref >59)

## 2022-03-09 ENCOUNTER — Ambulatory Visit: Payer: Medicare Other | Admitting: Dermatology

## 2022-03-09 DIAGNOSIS — D1800 Hemangioma unspecified site: Secondary | ICD-10-CM | POA: Diagnosis not present

## 2022-03-09 DIAGNOSIS — L578 Other skin changes due to chronic exposure to nonionizing radiation: Secondary | ICD-10-CM | POA: Diagnosis not present

## 2022-03-09 DIAGNOSIS — L570 Actinic keratosis: Secondary | ICD-10-CM | POA: Diagnosis not present

## 2022-03-09 DIAGNOSIS — L820 Inflamed seborrheic keratosis: Secondary | ICD-10-CM | POA: Diagnosis not present

## 2022-03-09 DIAGNOSIS — L814 Other melanin hyperpigmentation: Secondary | ICD-10-CM | POA: Diagnosis not present

## 2022-03-09 DIAGNOSIS — L821 Other seborrheic keratosis: Secondary | ICD-10-CM

## 2022-03-09 DIAGNOSIS — R202 Paresthesia of skin: Secondary | ICD-10-CM

## 2022-03-09 DIAGNOSIS — Z1283 Encounter for screening for malignant neoplasm of skin: Secondary | ICD-10-CM | POA: Diagnosis not present

## 2022-03-09 DIAGNOSIS — D229 Melanocytic nevi, unspecified: Secondary | ICD-10-CM | POA: Diagnosis not present

## 2022-03-09 NOTE — Progress Notes (Signed)
New Patient Visit  Subjective  Matthew [name] is a 76 y.o. male who presents for the following: Skin Problem (The patient presents for Upper Body Skin Exam (UBSE) for skin cancer screening and mole check.  The patient has spots, moles and lesions to be evaluated, some may be new or changing and the patient has concerns that these could be cancer. ).  The following portions of the chart were reviewed this encounter and updated as appropriate:   Tobacco  Allergies  Meds  Problems  Med Hx  Surg Hx  Fam Hx     Review of Systems:  No other skin or systemic complaints except as noted in HPI or Assessment and Plan.  Objective  Well appearing patient in no apparent distress; mood and affect are within normal limits.  All skin waist up examined.  back x 17 (17) Stuck-on, waxy, tan-brown papules --Discussed benign etiology and prognosis.   left hand x 5, right ear x 2 (7) Erythematous thin papules/macules with gritty scale.   back Clear skin    Assessment & Plan  Inflamed seborrheic keratosis (17) back x 17  Destruction of lesion - back x 17 Complexity: simple   Destruction method: cryotherapy   Informed consent: discussed and consent obtained   Timeout:  patient name, date of birth, surgical site, and procedure verified Lesion destroyed using liquid nitrogen: Yes   Region frozen until ice ball extended beyond lesion: Yes   Outcome: patient tolerated procedure well with no complications   Post-procedure details: wound care instructions given    AK (actinic keratosis) (7) left hand x 5, right ear x 2  Actinic keratoses are precancerous spots that appear secondary to cumulative UV radiation exposure/sun exposure over time. They are chronic with expected duration over 1 year. A portion of actinic keratoses will progress to squamous cell carcinoma of the skin. It is not possible to reliably predict which spots will progress to skin cancer and so treatment is recommended to  prevent development of skin cancer.  Recommend daily broad spectrum sunscreen SPF 30+ to sun-exposed areas, reapply every 2 hours as needed.  Recommend staying in the shade or wearing long sleeves, sun glasses (UVA+UVB protection) and wide brim hats (4-inch brim around the entire circumference of the hat). Call for new or changing lesions.   Destruction of lesion - left hand x 5, right ear x 2 Complexity: simple   Destruction method: cryotherapy   Informed consent: discussed and consent obtained   Timeout:  patient name, date of birth, surgical site, and procedure verified Lesion destroyed using liquid nitrogen: Yes   Region frozen until ice ball extended beyond lesion: Yes   Outcome: patient tolerated procedure well with no complications   Post-procedure details: wound care instructions given    Notalgia paresthetica back  Notalgia paresthetica is a chronic condition affecting the skin of the back in which a pinched nerve along the spine causes itching or changes in sensation in an area of skin. This is usually accompanied by chronic rubbing or scratching often leaving the area of skin discolored and thickened. There is no cure, but there are some treatments which may help control the itch.   Over the counter (non-prescription) treatments for notalgia paresthetica include numbing creams like pramoxine or lidocaine which temporarily reduce itch or Capsaicin-containing creams which cause a burning sensation but which sometimes over time will reset the nerves to stop producing itch.  If you choose to use Capsaicin cream, it is recommended  to use it 5 times daily for 1 week followed by 3 times daily for 3-6 weeks. You may have to continue using it long-term. For severe cases, there are some prescription cream or pill options which may help. Other treatment options include: - Transcutaneous Electrical Nerve Stimulation (TENS) - Gabapentin 300-900 mg daily po - Amitriptyline orally -  Paravertebral local anesthetic block - intralesional Botulinum toxin A   Skin cancer screening  Lentigines - Scattered tan macules - Due to sun exposure - Benign-appearing, observe - Recommend daily broad spectrum sunscreen SPF 30+ to sun-exposed areas, reapply every 2 hours as needed. - Call for any changes  Seborrheic Keratoses - Stuck-on, waxy, tan-brown papules and/or plaques  - Benign-appearing - Discussed benign etiology and prognosis. - Observe - Call for any changes  Melanocytic Nevi - Tan-brown and/or pink-flesh-colored symmetric macules and papules - Benign appearing on exam today - Observation - Call clinic for new or changing moles - Recommend daily use of broad spectrum spf 30+ sunscreen to sun-exposed areas.   Hemangiomas - Red papules - Discussed benign nature - Observe - Call for any changes  Actinic Damage - Chronic condition, secondary to cumulative UV/sun exposure - diffuse scaly erythematous macules with underlying dyspigmentation - Recommend daily broad spectrum sunscreen SPF 30+ to sun-exposed areas, reapply every 2 hours as needed.  - Staying in the shade or wearing long sleeves, sun glasses (UVA+UVB protection) and wide brim hats (4-inch brim around the entire circumference of the hat) are also recommended for sun protection.  - Call for new or changing lesions.  Skin cancer screening performed today.   Return in about 4 months (around 07/10/2022) for Aks, ISKs.  IMarye Round, CMA, am acting as scribe for Sarina Ser, MD .  Documentation: I have reviewed the above documentation for accuracy and completeness, and I agree with the above.  Sarina Ser, MD

## 2022-03-09 NOTE — Patient Instructions (Addendum)

## 2022-03-15 DIAGNOSIS — I25118 Atherosclerotic heart disease of native coronary artery with other forms of angina pectoris: Secondary | ICD-10-CM | POA: Diagnosis not present

## 2022-03-15 DIAGNOSIS — J410 Simple chronic bronchitis: Secondary | ICD-10-CM | POA: Diagnosis not present

## 2022-03-15 DIAGNOSIS — G4733 Obstructive sleep apnea (adult) (pediatric): Secondary | ICD-10-CM | POA: Diagnosis not present

## 2022-03-15 DIAGNOSIS — I2510 Atherosclerotic heart disease of native coronary artery without angina pectoris: Secondary | ICD-10-CM | POA: Diagnosis not present

## 2022-03-15 DIAGNOSIS — E782 Mixed hyperlipidemia: Secondary | ICD-10-CM | POA: Diagnosis not present

## 2022-03-23 ENCOUNTER — Encounter: Payer: Self-pay | Admitting: Dermatology

## 2022-03-31 ENCOUNTER — Other Ambulatory Visit: Payer: Self-pay | Admitting: Family Medicine

## 2022-04-05 DIAGNOSIS — Z79899 Other long term (current) drug therapy: Secondary | ICD-10-CM | POA: Diagnosis not present

## 2022-05-09 ENCOUNTER — Telehealth: Payer: Self-pay

## 2022-05-09 NOTE — Telephone Encounter (Signed)
Copied from [place] [id number]. Topic: General - Other >> May 09, 2022  1:54 PM ENIDPOEU J wrote: Reason for CRM: Monica NP with [facility] went out to see pt today. She completed a PAD screening on pt showing that his left foot is normal 1.07; right foot 0.76 mild. The report will be sent to provider. Monica request that if any concerns please reach out to pt directly. She is available for any further questions.   She also mentioned that pt is asymptomatic and doing well.    [place] [phone] -

## 2022-06-13 DIAGNOSIS — H903 Sensorineural hearing loss, bilateral: Secondary | ICD-10-CM | POA: Diagnosis not present

## 2022-06-13 DIAGNOSIS — J301 Allergic rhinitis due to pollen: Secondary | ICD-10-CM | POA: Diagnosis not present

## 2022-06-13 DIAGNOSIS — J329 Chronic sinusitis, unspecified: Secondary | ICD-10-CM | POA: Diagnosis not present

## 2022-06-13 DIAGNOSIS — H6983 Other specified disorders of Eustachian tube, bilateral: Secondary | ICD-10-CM | POA: Diagnosis not present

## 2022-06-22 ENCOUNTER — Encounter: Payer: Self-pay | Admitting: Internal Medicine

## 2022-06-27 ENCOUNTER — Other Ambulatory Visit: Payer: Self-pay | Admitting: Family Medicine

## 2022-07-06 ENCOUNTER — Encounter: Payer: Self-pay | Admitting: Internal Medicine

## 2022-07-26 ENCOUNTER — Ambulatory Visit: Payer: Medicare Other | Admitting: Family Medicine

## 2022-07-26 DIAGNOSIS — H2513 Age-related nuclear cataract, bilateral: Secondary | ICD-10-CM | POA: Diagnosis not present

## 2022-07-27 ENCOUNTER — Ambulatory Visit (AMBULATORY_SURGERY_CENTER): Payer: Self-pay

## 2022-07-27 VITALS — Ht 72.0 in | Wt 256.0 lb

## 2022-07-27 DIAGNOSIS — Z86010 Personal history of colonic polyps: Secondary | ICD-10-CM

## 2022-07-27 MED ORDER — NA SULFATE-K SULFATE-MG SULF 17.5-3.13-1.6 GM/177ML PO SOLN: 1.0000 | Freq: Once | ORAL | 0 refills | Status: AC

## 2022-07-27 NOTE — Progress Notes (Signed)
No egg or soy allergy known to patient  No issues known to pt with past sedation with any surgeries or procedures Patient denies ever being told they had issues or difficulty with intubation  No FH of Malignant Hyperthermia Pt is not on diet pills Pt is not on home 02  Pt is not on blood thinners  Pt denies issues with constipation  No A fib or A flutter Have any cardiac testing pending--NO Pt instructed to use [email or web address][employer][email or web address] or GoodRx for a price reduction on prep   

## 2022-07-27 NOTE — Progress Notes (Unsigned)
Annual Wellness Visit    I,Matthew [name],acting as a scribe for [employer], MD.,have documented all relevant documentation on the behalf of Matthew Ramp, MD,as directed by  Matthew Ramp, MD while in the presence of Matthew Ramp, MD.   Patient: Matthew [name], Male    DOB: 12-07-1945, 76 y.o.   MRN: [id number] Visit Date: 07/28/2022  Today's Provider: [name], MD   Chief Complaint  Patient presents with   Annual Exam   Subjective    Matthew [name] is a 76 y.o. male who presents today for his Annual Wellness Visit. He reports consuming a general diet. Home exercise routine includes yard work and rides his bike. He generally feels well. He reports sleeping poorly. He does not have additional problems to discuss today.    08/25/22 appt with gastro  Medications: Outpatient Medications Prior to Visit  Medication Sig   acetaminophen (TYLENOL) 500 MG tablet Take 500 mg by mouth every 6 (six) hours as needed.   albuterol (VENTOLIN HFA) 108 (90 Base) MCG/ACT inhaler TAKE 2 PUFFS BY MOUTH EVERY 6 HOURS AS NEEDED FOR WHEEZE OR SHORTNESS OF BREATH   Ascorbic Acid (VITAMIN C) 1000 MG tablet Take 1,000 mg by mouth daily.   aspirin EC 81 MG tablet Take 81 mg by mouth daily. Swallow whole.   atorvastatin (LIPITOR) 20 MG tablet TAKE 1 TABLET BY MOUTH EVERY DAY (Patient taking differently: Patient is taking 40mg )   folic acid (FOLVITE) 800 MCG tablet Take 400 mcg by mouth daily.   levothyroxine (SYNTHROID) 50 MCG tablet TAKE 1 TABLET BY MOUTH EVERY DAY   losartan (COZAAR) 50 MG tablet Take 50 mg by mouth daily. Patient reports taking half of one. (25)   meloxicam (MOBIC) 15 MG tablet Take 15 mg by mouth daily.   Multiple Vitamins-Minerals (CENTRUM SILVER ADULT 50+ PO) Take 1 tablet by mouth daily.   Potassium 75 MG TABS Take 99 mg by mouth daily.   Testosterone 25 MG/2.5GM (1%) GEL Apply topically.   vitamin E 180 MG (400  UNITS) capsule Take 400 Units by mouth daily.   [DISCONTINUED] SYMBICORT 160-4.5 MCG/ACT inhaler INHALE 1-2 PUFFS TWICE DAILY   azelastine (ASTELIN) 0.1 % nasal spray Place into both nostrils 2 (two) times daily. Use in each nostril as directed   fluticasone (FLONASE) 50 MCG/ACT nasal spray Place 2 sprays into both nostrils daily.   levalbuterol (XOPENEX HFA) 45 MCG/ACT inhaler Inhale 1-2 puffs into the lungs every 6 (six) hours as needed for wheezing. (Patient not taking: Reported on 07/28/2022)   [DISCONTINUED] cephALEXin (KEFLEX) 500 MG capsule Take 1 capsule (500 mg total) by mouth 3 (three) times daily.   [DISCONTINUED] furosemide (LASIX) 20 MG tablet TAKE 1 TABLET BY MOUTH EVERY DAY   [DISCONTINUED] Naproxen Sodium 220 MG CAPS as needed.   [DISCONTINUED] Phenylephrine-APAP-guaiFENesin 5-325-200 MG TABS Per bottle directions as needed   [DISCONTINUED] sildenafil (REVATIO) 20 MG tablet Take 2 to 4 tablets daily as needed   No facility-administered medications prior to visit.    No Known Allergies  Patient Care Team: 07/30/2022, MD as PCP - General (Family Medicine) Pa, [facility] as Consulting Physician (Optometry) Matthew Ramp, MD as Consulting Physician (Gastroenterology) Hilarie Fredrickson, MD as Consulting Physician (Ophthalmology) [place] Crane, MD as Referring Physician (Specialist) Mertie Moores, MD (Orthopedic Surgery) Deeann Saint, Linus Galas (Podiatry) [place], MD as Consulting Physician (Urology) Vanna Scotland, MD as Consulting Physician (Urology)  Review of Systems  All other systems reviewed and are negative.   Last CBC Lab Results  Component Value Date   WBC 7.3 06/22/2021   HGB 16.7 06/22/2021   HCT 47.8 06/22/2021   MCV 95 06/22/2021   MCH 33.1 (H) 06/22/2021   RDW 11.9 06/22/2021   PLT 240 16/08/9603   Last metabolic panel Lab Results  Component Value Date   GLUCOSE 139 (H) 03/03/2022   NA 142 03/03/2022   K 5.1  03/03/2022   CL 105 03/03/2022   CO2 21 03/03/2022   BUN 29 (H) 03/03/2022   CREATININE 1.24 03/03/2022   EGFR 61 03/03/2022   CALCIUM 9.5 03/03/2022   PHOS 3.4 02/25/2021   PROT 7.2 03/03/2022   ALBUMIN 4.7 03/03/2022   LABGLOB 2.5 03/03/2022   AGRATIO 1.9 03/03/2022   BILITOT 0.5 03/03/2022   ALKPHOS 54 03/03/2022   AST 25 03/03/2022   ALT 24 03/03/2022   ANIONGAP 7 12/31/2020   Last lipids Lab Results  Component Value Date   CHOL 145 06/22/2021   HDL 48 06/22/2021   LDLCALC 75 06/22/2021   TRIG 124 06/22/2021   CHOLHDL 3.0 06/22/2021   Last thyroid functions Lab Results  Component Value Date   TSH 3.110 06/22/2021      Objective    Vitals: BP 123/76 (BP Location: Left Arm, Patient Position: Sitting, Cuff Size: Large)   Pulse 71   Temp 98.1 F (36.7 C) (Oral)   Resp 16   Ht $R'6\' 1"'pK$  (1.854 m)   Wt 256 lb 1.6 oz (116.2 kg)   BMI 33.79 kg/m  BP Readings from Last 3 Encounters:  07/28/22 123/76  03/03/22 134/78  10/05/21 (!) 151/78   Wt Readings from Last 3 Encounters:  07/28/22 256 lb 1.6 oz (116.2 kg)  07/27/22 256 lb (116.1 kg)  03/03/22 246 lb (111.6 kg)       Physical Exam Vitals reviewed.  Constitutional:      General: He is not in acute distress.    Appearance: Normal appearance. He is not ill-appearing, toxic-appearing or diaphoretic.  HENT:     Head: Normocephalic and atraumatic.     Right Ear: Tympanic membrane and external ear normal.     Left Ear: Tympanic membrane and external ear normal.     Nose: Nose normal.     Mouth/Throat:     Mouth: Mucous membranes are moist.     Pharynx: No oropharyngeal exudate or posterior oropharyngeal erythema.  Eyes:     Extraocular Movements: Extraocular movements intact.     Conjunctiva/sclera: Conjunctivae normal.     Pupils: Pupils are equal, round, and reactive to light.  Cardiovascular:     Rate and Rhythm: Normal rate and regular rhythm.     Pulses: Normal pulses.     Heart sounds: Normal heart  sounds. No murmur heard. Pulmonary:     Effort: Pulmonary effort is normal. No respiratory distress.     Breath sounds: Normal breath sounds. No wheezing, rhonchi or rales.  Abdominal:     General: Bowel sounds are normal. There is no distension.     Palpations: There is no mass.     Tenderness: There is no abdominal tenderness.  Musculoskeletal:        General: No swelling, tenderness or signs of injury. Normal range of motion.     Cervical back: Normal range of motion and neck supple.  Skin:    General: Skin is warm and dry.     Findings: No erythema or  rash.  Neurological:     General: No focal deficit present.     Mental Status: He is alert and oriented to person, place, and time.     Cranial Nerves: No cranial nerve deficit.     Motor: No weakness.     Gait: Gait normal.      Most recent functional status assessment:    07/28/2022   10:16 AM  In your present state of health, do you have any difficulty performing the following activities:  Hearing? 1  Comment wears hearing aids  Vision? 0  Difficulty concentrating or making decisions? 0  Walking or climbing stairs? 1  Dressing or bathing? 0  Doing errands, shopping? 0   Most recent fall risk assessment:    07/28/2022   10:13 AM  Fall Risk   Falls in the past year? 0  Number falls in past yr: 0  Injury with Fall? 0  Risk for fall due to : No Fall Risks    Most recent depression screenings:    07/28/2022   10:14 AM 06/21/2021   10:18 AM  PHQ 2/9 Scores  PHQ - 2 Score 0 1  PHQ- 9 Score 4 8   Most recent cognitive screening:    07/28/2022   10:21 AM  6CIT Screen  What Year? 0 points  What month? 0 points  What time? 0 points  Count back from 20 0 points  Months in reverse 0 points  Repeat phrase 0 points  Total Score 0 points   Most recent Audit-C alcohol use screening    07/28/2022   10:17 AM  Alcohol Use Disorder Test (AUDIT)  1. How often do you have a drink containing alcohol? 3  2. How many  drinks containing alcohol do you have on a typical day when you are drinking? 0  3. How often do you have six or more drinks on one occasion? 0  AUDIT-C Score 3  4. How often during the last year have you found that you were not able to stop drinking once you had started? 0  5. How often during the last year have you failed to do what was normally expected from you because of drinking? 0  6. How often during the last year have you needed a first drink in the morning to get yourself going after a heavy drinking session? 0  7. How often during the last year have you had a feeling of guilt of remorse after drinking? 0  8. How often during the last year have you been unable to remember what happened the night before because you had been drinking? 0  9. Have you or someone else been injured as a result of your drinking? 0  10. Has a relative or friend or a doctor or another health worker been concerned about your drinking or suggested you cut down? 0  Alcohol Use Disorder Identification Test Final Score (AUDIT) 3   A score of 3 or more in women, and 4 or more in men indicates increased risk for alcohol abuse, EXCEPT if all of the points are from question 1   No results found for any visits on 07/28/22.  Assessment & Plan     Annual wellness visit done today including the all of the following: Reviewed patient's Family Medical History Reviewed and updated list of patient's medical providers Assessment of cognitive impairment was done Assessed patient's functional ability Established a written schedule for health screening [place] Completed and  Reviewed  Exercise Activities and Dietary recommendations  Goals      DIET - INCREASE WATER INTAKE     Recommend increasing water intake to 4-6 glasses a day.      Exercise 3x per week (30 min per time)     Recommend to exercise for 3 days a week for at least 30 minutes at a time.         Immunization History   Administered Date(s) Administered   Fluad Quad(high Dose 65+) 06/26/2019, 07/15/2020, 09/07/2021, 07/28/2022   Influenza Whole 07/31/2012   Influenza, High Dose Seasonal PF 10/10/2016, 09/26/2017   Pneumococcal Conjugate-13 08/04/2014   Pneumococcal Polysaccharide-23 05/24/2017   Td 07/30/2004   Tdap 08/04/2014, 11/24/2016    Health Maintenance  Topic Date Due   COVID-19 Vaccine (1) Never done   Zoster Vaccines- Shingrix (1 of 2) Never done   COLONOSCOPY (Pts 45-65yrs Insurance coverage will need to be confirmed)  07/16/2022   TETANUS/TDAP  11/24/2026   Pneumonia Vaccine 49+ Years old  Completed   INFLUENZA VACCINE  Completed   Hepatitis C Screening  Completed   HPV VACCINES  Aged Out     Discussed health benefits of physical activity, and encouraged him to engage in regular exercise appropriate for his age and condition.    Problem List Items Addressed This Visit       Cardiovascular and Mediastinum   Essential (primary) hypertension    Chronic, controlled  He will continue losartan 50mg  daily  CMP collected today        Relevant Orders   Comprehensive metabolic panel     Endocrine   Adult hypothyroidism    Chronic, stable  Will check TSH and T4 today  Recommended continuation of synthroid 26mcg daily         Relevant Orders   TSH     Other   HLD (hyperlipidemia)    Chronic, stable  He will continue atorvastatin 40mg  daily  Lipid panel collected today        Relevant Orders   Lipid Panel With LDL/HDL Ratio   Comprehensive metabolic panel   Need for influenza vaccination    Influenza vaccine administered today       Relevant Orders   Flu Vaccine QUAD High Dose(Fluad) (Completed)   Other Visit Diagnoses     Encounter for subsequent annual wellness visit in Medicare patient    -  Primary   Relevant Orders   Lipid Panel With LDL/HDL Ratio   Comprehensive metabolic panel   TSH        No follow-ups on file.     I, [name], MD, have reviewed all documentation for this visit. The documentation on 07/28/22 for the exam, diagnosis, procedures, and orders are all accurate and complete.    [name], MD  [facility] [phone] (phone) [phone] (fax)  [place]

## 2022-07-28 ENCOUNTER — Encounter: Payer: Self-pay | Admitting: Family Medicine

## 2022-07-28 ENCOUNTER — Ambulatory Visit (INDEPENDENT_AMBULATORY_CARE_PROVIDER_SITE_OTHER): Payer: Medicare Other | Admitting: Family Medicine

## 2022-07-28 ENCOUNTER — Ambulatory Visit: Payer: Medicare Other | Admitting: Family Medicine

## 2022-07-28 VITALS — BP 123/76 | HR 71 | Temp 98.1°F | Resp 16 | Ht 73.0 in | Wt 256.1 lb

## 2022-07-28 DIAGNOSIS — E785 Hyperlipidemia, unspecified: Secondary | ICD-10-CM | POA: Diagnosis not present

## 2022-07-28 DIAGNOSIS — E039 Hypothyroidism, unspecified: Secondary | ICD-10-CM | POA: Diagnosis not present

## 2022-07-28 DIAGNOSIS — Z0000 Encounter for general adult medical examination without abnormal findings: Secondary | ICD-10-CM | POA: Diagnosis not present

## 2022-07-28 DIAGNOSIS — I10 Essential (primary) hypertension: Secondary | ICD-10-CM | POA: Diagnosis not present

## 2022-07-28 DIAGNOSIS — Z23 Encounter for immunization: Secondary | ICD-10-CM

## 2022-07-28 MED ORDER — BUDESONIDE-FORMOTEROL FUMARATE 160-4.5 MCG/ACT IN AERO: INHALATION_SPRAY | RESPIRATORY_TRACT | 12 refills | Status: AC

## 2022-07-28 NOTE — Assessment & Plan Note (Signed)
Influenza vaccine administered today.

## 2022-07-28 NOTE — Assessment & Plan Note (Signed)
Chronic, controlled  He will continue losartan '50mg'$  daily  CMP collected today

## 2022-07-28 NOTE — Assessment & Plan Note (Signed)
Chronic, stable  Will check TSH and T4 today  Recommended continuation of synthroid 73mg daily

## 2022-07-28 NOTE — Assessment & Plan Note (Signed)
Chronic, stable  He will continue atorvastatin '40mg'$  daily  Lipid panel collected today

## 2022-07-28 NOTE — Patient Instructions (Signed)
   The CDC recommends two doses of Shingrix (the shingles vaccine) separated by 2 to 6 months for adults age 76 years and older. I recommend checking with your insurance plan regarding coverage for this vaccine.   

## 2022-07-29 LAB — COMPREHENSIVE METABOLIC PANEL
ALT: 27 IU/L (ref 0–44)
AST: 29 IU/L (ref 0–40)
Albumin/Globulin Ratio: 2 (ref 1.2–2.2)
Albumin: 4.5 g/dL (ref 3.8–4.8)
Alkaline Phosphatase: 53 IU/L (ref 44–121)
BUN/Creatinine Ratio: 23 (ref 10–24)
BUN: 24 mg/dL (ref 8–27)
Bilirubin Total: 0.7 mg/dL (ref 0.0–1.2)
CO2: 22 mmol/L (ref 20–29)
Calcium: 9.3 mg/dL (ref 8.6–10.2)
Chloride: 99 mmol/L (ref 96–106)
Creatinine, Ser: 1.04 mg/dL (ref 0.76–1.27)
Globulin, Total: 2.2 g/dL (ref 1.5–4.5)
Glucose: 112 mg/dL — ABNORMAL HIGH (ref 70–99)
Potassium: 4.9 mmol/L (ref 3.5–5.2)
Sodium: 143 mmol/L (ref 134–144)
Total Protein: 6.7 g/dL (ref 6.0–8.5)
eGFR: 74 mL/min/{1.73_m2} (ref >59)

## 2022-07-29 LAB — LIPID PANEL WITH LDL/HDL RATIO
Cholesterol, Total: 147 mg/dL (ref 100–199)
HDL: 42 mg/dL (ref >39)
LDL Chol Calc (NIH): 53 mg/dL (ref 0–99)
LDL/HDL Ratio: 1.3 ratio (ref 0.0–3.6)
Triglycerides: 345 mg/dL — ABNORMAL HIGH (ref 0–149)
VLDL Cholesterol Cal: 52 mg/dL — ABNORMAL HIGH (ref 5–40)

## 2022-07-29 LAB — TSH: TSH: 3.18 u[IU]/mL (ref 0.450–4.500)

## 2022-08-08 NOTE — Progress Notes (Unsigned)
     I,[name],acting as a scribe for [employer], MD.,have documented all relevant documentation on the behalf of Matthew Foster, MD,as directed by  Matthew Foster, MD while in the presence of Matthew Foster, MD.   Established patient visit   Patient: Matthew [name]   DOB: 1946-01-06   76 y.o. Male  MRN: [id number] Visit Date: 08/09/2022  Today's healthcare provider: [name], MD   No chief complaint on file.  Subjective    HPI  Patient comes in to go over Trig 345 levels results.  Medications: Outpatient Medications Prior to Visit  Medication Sig   acetaminophen (TYLENOL) 500 MG tablet Take 500 mg by mouth every 6 (six) hours as needed.   albuterol (VENTOLIN HFA) 108 (90 Base) MCG/ACT inhaler TAKE 2 PUFFS BY MOUTH EVERY 6 HOURS AS NEEDED FOR WHEEZE OR SHORTNESS OF BREATH   Ascorbic Acid (VITAMIN C) 1000 MG tablet Take 1,000 mg by mouth daily.   aspirin EC 81 MG tablet Take 81 mg by mouth daily. Swallow whole.   atorvastatin (LIPITOR) 20 MG tablet TAKE 1 TABLET BY MOUTH EVERY DAY (Patient taking differently: Patient is taking '40mg'$ )   azelastine (ASTELIN) 0.1 % nasal spray Place into both nostrils 2 (two) times daily. Use in each nostril as directed   budesonide-formoterol (SYMBICORT) 160-4.5 MCG/ACT inhaler INHALE 1-2 PUFFS TWICE DAILY   fluticasone (FLONASE) 50 MCG/ACT nasal spray Place 2 sprays into both nostrils daily.   folic acid (FOLVITE) 703 MCG tablet Take 400 mcg by mouth daily.   levalbuterol (XOPENEX HFA) 45 MCG/ACT inhaler Inhale 1-2 puffs into the lungs every 6 (six) hours as needed for wheezing. (Patient not taking: Reported on 07/28/2022)   levothyroxine (SYNTHROID) 50 MCG tablet TAKE 1 TABLET BY MOUTH EVERY DAY   losartan (COZAAR) 50 MG tablet Take 50 mg by mouth daily. Patient reports taking half of one. (25)   meloxicam (MOBIC) 15 MG tablet Take 15 mg by mouth daily.   Multiple Vitamins-Minerals  (CENTRUM SILVER ADULT 50+ PO) Take 1 tablet by mouth daily.   Potassium 75 MG TABS Take 99 mg by mouth daily.   Testosterone 25 MG/2.5GM (1%) GEL Apply topically.   vitamin E 180 MG (400 UNITS) capsule Take 400 Units by mouth daily.   No facility-administered medications prior to visit.    Review of Systems  {Labs  Heme  Chem  Endocrine  Serology  Results Review (optional):23779}   Objective    There were no vitals taken for this visit. {Show previous vital signs (optional):23777}  Physical Exam  ***  No results found for any visits on 08/09/22.  Assessment & Plan     ***  No follow-ups on file.      {provider attestation***:1}   Matthew Foster, MD  [facility] [phone] (phone) [phone] (fax)  [place]

## 2022-08-09 ENCOUNTER — Ambulatory Visit (INDEPENDENT_AMBULATORY_CARE_PROVIDER_SITE_OTHER): Payer: Medicare Other | Admitting: Family Medicine

## 2022-08-09 ENCOUNTER — Encounter: Payer: Self-pay | Admitting: Family Medicine

## 2022-08-09 VITALS — BP 139/72 | HR 62 | Temp 98.5°F | Resp 16 | Wt 258.6 lb

## 2022-08-09 DIAGNOSIS — R600 Localized edema: Secondary | ICD-10-CM | POA: Diagnosis not present

## 2022-08-09 DIAGNOSIS — E039 Hypothyroidism, unspecified: Secondary | ICD-10-CM | POA: Diagnosis not present

## 2022-08-09 DIAGNOSIS — E781 Pure hyperglyceridemia: Secondary | ICD-10-CM

## 2022-08-09 MED ORDER — ATORVASTATIN CALCIUM 40 MG PO TABS: ORAL_TABLET | ORAL | 1 refills | Status: AC

## 2022-08-09 NOTE — Assessment & Plan Note (Signed)
Chronic, controlled Patient will continue Synthroid 50 mcg daily Last TSH was in September 2023 and was within goal range

## 2022-08-09 NOTE — Assessment & Plan Note (Addendum)
Chronic, controlled Patient is within goal range for LDL less than 70 We discussed at length triglycerides and dietary modifications to decrease these levels Patient would like to complete 3 months of dietary modifications to lower triglycerides of 345 He was given handout on Mediterranean diet He will continue atorvastatin 40 mg daily Offered option of fenofibrate if levels continue to be elevated or if they increase despite dietary modifications, both patient and his wife are present for this discussion We will recheck triglycerides in 3 months

## 2022-08-09 NOTE — Assessment & Plan Note (Signed)
Bilateral lower extremity edema Trace Patient will take 20 mg Lasix for 3-4 days every other day to help decrease swelling Discussed that if patient takes this for longer than the recommended amount of days, he will need more frequent follow-up as well as monitoring of his kidney and electrolyte levels Patient and his wife voiced understanding

## 2022-08-09 NOTE — Patient Instructions (Signed)
Mediterranean Diet ?A Mediterranean diet refers to food and lifestyle choices that are based on the traditions of countries located on the [place] Sea. It focuses on eating more fruits, vegetables, whole grains, beans, nuts, seeds, and heart-healthy fats, and eating less dairy, meat, eggs, and processed foods with added sugar, salt, and fat. This way of eating has been shown to help prevent certain conditions and improve outcomes for people who have chronic diseases, like kidney disease and heart disease. ?What are tips for following this plan? ?Reading food labels ?Check the serving size of packaged foods. For foods such as rice and pasta, the serving size refers to the amount of cooked product, not dry. ?Check the total fat in packaged foods. Avoid foods that have saturated fat or trans fats. ?Check the ingredient list for added sugars, such as corn syrup. ?Shopping ? ?Buy a variety of foods that offer a balanced diet, including: ?Fresh fruits and vegetables (produce). ?Grains, beans, nuts, and seeds. Some of these may be available in unpackaged forms or large amounts (in bulk). ?Fresh seafood. ?Poultry and eggs. ?Low-fat dairy products. ?Buy whole ingredients instead of prepackaged foods. ?Buy fresh fruits and vegetables in-season from local farmers markets. ?Buy plain frozen fruits and vegetables. ?If you do not have access to quality fresh seafood, buy precooked frozen shrimp or canned fish, such as tuna, salmon, or sardines. ?Stock your pantry so you always have certain foods on hand, such as olive oil, canned tuna, canned tomatoes, rice, pasta, and beans. ?Cooking ?Cook foods with extra-virgin olive oil instead of using butter or other vegetable oils. ?Have meat as a side dish, and have vegetables or grains as your main dish. This means having meat in small portions or adding small amounts of meat to foods like pasta or stew. ?Use beans or vegetables instead of meat in common dishes like chili or  lasagna. ?Experiment with different cooking methods. Try roasting, broiling, steaming, and saut?ing vegetables. ?Add frozen vegetables to soups, stews, pasta, or rice. ?Add nuts or seeds for added healthy fats and plant protein at each meal. You can add these to yogurt, salads, or vegetable dishes. ?Marinate fish or vegetables using olive oil, lemon juice, garlic, and fresh herbs. ?Meal planning ?Plan to eat one vegetarian meal one day each week. Try to work up to two vegetarian meals, if possible. ?Eat seafood two or more times a week. ?Have healthy snacks readily available, such as: ?Vegetable sticks with hummus. ?Greek yogurt. ?Fruit and nut trail mix. ?Eat balanced meals throughout the week. This includes: ?Fruit: 2-3 servings a day. ?Vegetables: 4-5 servings a day. ?Low-fat dairy: 2 servings a day. ?Fish, poultry, or lean meat: 1 serving a day. ?Beans and legumes: 2 or more servings a week. ?Nuts and seeds: 1-2 servings a day. ?Whole grains: 6-8 servings a day. ?Extra-virgin olive oil: 3-4 servings a day. ?Limit red meat and sweets to only a few servings a month. ?Lifestyle ? ?Cook and eat meals together with your family, when possible. ?Drink enough fluid to keep your urine pale yellow. ?Be physically active every day. This includes: ?Aerobic exercise like running or swimming. ?Leisure activities like gardening, walking, or housework. ?Get 7-8 hours of sleep each night. ?If recommended by your health care provider, drink red wine in moderation. This means 1 glass a day for nonpregnant women and 2 glasses a day for men. A glass of wine equals 5 oz (150 mL). ?What foods should I eat? ?Fruits ?Apples. Apricots. Avocado. Berries. Bananas. Cherries. Dates.   Figs. Grapes. Lemons. Melon. Oranges. Peaches. Plums. Pomegranate. ?Vegetables ?Artichokes. Beets. Broccoli. Cabbage. Carrots. Eggplant. Green beans. Chard. Kale. Spinach. Onions. Leeks. Peas. Squash. Tomatoes. Peppers. Radishes. ?Grains ?Whole-grain pasta. Brown  rice. Bulgur wheat. Polenta. Couscous. Whole-wheat bread. Oatmeal. Quinoa. ?Meats and other proteins ?Beans. Almonds. Sunflower seeds. Pine nuts. Peanuts. Cod. Salmon. Scallops. Shrimp. Tuna. Tilapia. Clams. Oysters. Eggs. Poultry without skin. ?Dairy ?Low-fat milk. Cheese. Greek yogurt. ?Fats and oils ?Extra-virgin olive oil. Avocado oil. Grapeseed oil. ?Beverages ?Water. Red wine. Herbal tea. ?Sweets and desserts ?Greek yogurt with honey. Baked apples. Poached pears. Trail mix. ?Seasonings and condiments ?Basil. Cilantro. Coriander. Cumin. Mint. Parsley. Sage. Rosemary. Tarragon. Garlic. Oregano. Thyme. Pepper. Balsamic vinegar. Tahini. Hummus. Tomato sauce. Olives. Mushrooms. ?The items listed above may not be a complete list of foods and beverages you can eat. Contact a dietitian for more information. ?What foods should I limit? ?This is a list of foods that should be eaten rarely or only on special occasions. ?Fruits ?Fruit canned in syrup. ?Vegetables ?Deep-fried potatoes (french fries). ?Grains ?Prepackaged pasta or rice dishes. Prepackaged cereal with added sugar. Prepackaged snacks with added sugar. ?Meats and other proteins ?Beef. Pork. Lamb. Poultry with skin. Hot dogs. Bacon. ?Dairy ?Ice cream. Sour cream. Whole milk. ?Fats and oils ?Butter. Canola oil. Vegetable oil. Beef fat (tallow). Lard. ?Beverages ?Juice. Sugar-sweetened soft drinks. Beer. Liquor and spirits. ?Sweets and desserts ?Cookies. Cakes. Pies. Candy. ?Seasonings and condiments ?Mayonnaise. Pre-made sauces and marinades. ?The items listed above may not be a complete list of foods and beverages you should limit. Contact a dietitian for more information. ?Summary ?The Mediterranean diet includes both food and lifestyle choices. ?Eat a variety of fresh fruits and vegetables, beans, nuts, seeds, and whole grains. ?Limit the amount of red meat and sweets that you eat. ?If recommended by your health care provider, drink red wine in moderation.  This means 1 glass a day for nonpregnant women and 2 glasses a day for men. A glass of wine equals 5 oz (150 mL). ?This information is not intended to replace advice given to you by your health care provider. Make sure you discuss any questions you have with your health care provider. ?Document Revised: 11/22/2019 Document Reviewed: 09/19/2019 ?Elsevier Patient Education ? 2023 [employer] ? ?

## 2022-08-11 ENCOUNTER — Ambulatory Visit: Payer: Medicare Other | Admitting: Dermatology

## 2022-08-16 ENCOUNTER — Encounter: Payer: Self-pay | Admitting: Internal Medicine

## 2022-08-21 ENCOUNTER — Other Ambulatory Visit: Payer: Self-pay | Admitting: Family Medicine

## 2022-08-25 ENCOUNTER — Ambulatory Visit (AMBULATORY_SURGERY_CENTER): Payer: Medicare Other | Admitting: Internal Medicine

## 2022-08-25 ENCOUNTER — Encounter: Payer: Self-pay | Admitting: Internal Medicine

## 2022-08-25 VITALS — BP 120/75 | HR 66 | Temp 98.9°F | Resp 11 | Ht 72.0 in | Wt 256.0 lb

## 2022-08-25 DIAGNOSIS — Z86010 Personal history of colonic polyps: Secondary | ICD-10-CM | POA: Diagnosis not present

## 2022-08-25 DIAGNOSIS — D120 Benign neoplasm of cecum: Secondary | ICD-10-CM

## 2022-08-25 DIAGNOSIS — Z09 Encounter for follow-up examination after completed treatment for conditions other than malignant neoplasm: Secondary | ICD-10-CM

## 2022-08-25 MED ORDER — SODIUM CHLORIDE 0.9 % IV SOLN: 500.0000 mL | Freq: Once | INTRAVENOUS | Status: DC

## 2022-08-25 NOTE — Op Note (Signed)
[place] Patient Name: Matthew [name] Procedure Date: 08/25/2022 9:26 AM MRN: [id number] Endoscopist: Docia Chuck. Henrene Pastor , MD, [id number] Age: 76 Referring MD:  Date of Birth: 20-Oct-1946 Gender: Male Account #: [id number] Procedure:                Colonoscopy with cold snare polypectomy x 1 Indications:              High risk colon cancer surveillance: Personal                            history of multiple (3 or more) adenomas. History                            of malignant colon polyp. Previous examinations                            2002, 2003, 2006, 2008, 2010, 2012, 2014, 2016,                            2018, 2020 Medicines:                Monitored Anesthesia Care Procedure:                Pre-Anesthesia Assessment:                           - Prior to the procedure, a History and Physical                            was performed, and patient medications and                            allergies were reviewed. The patient's tolerance of                            previous anesthesia was also reviewed. The risks                            and benefits of the procedure and the sedation                            options and risks were discussed with the patient.                            All questions were answered, and informed consent                            was obtained. Prior Anticoagulants: The patient has                            taken no anticoagulant or antiplatelet agents. ASA                            Grade Assessment: II - A patient with mild systemic  disease. After reviewing the risks and benefits,                            the patient was deemed in satisfactory condition to                            undergo the procedure.                           After obtaining informed consent, the colonoscope                            was passed under direct vision. Throughout the                            procedure, the patient's  blood pressure, pulse, and                            oxygen saturations were monitored continuously. The                            Colonoscope was introduced through the anus and                            advanced to the the cecum, identified by                            appendiceal orifice and ileocecal valve. The                            ileocecal valve, appendiceal orifice, and rectum                            were photographed. The quality of the bowel                            preparation was excellent. The colonoscopy was                            performed without difficulty. The patient tolerated                            the procedure well. The bowel preparation used was                            SUPREP via split dose instruction. Scope In: 9:32:00 AM Scope Out: 9:42:42 AM Scope Withdrawal Time: 0 hours 8 minutes 51 seconds  Total Procedure Duration: 0 hours 10 minutes 42 seconds  Findings:                 A 1 mm polyp was found in the cecum. The polyp was                            removed with a cold snare. Resection and retrieval  were complete.                           Multiple diverticula were found in the ascending                            colon and left colon.                           Internal hemorrhoids were found during retroflexion.                           The exam was otherwise without abnormality on                            direct and retroflexion views. Complications:            No immediate complications. Estimated blood loss:                            None. Estimated Blood Loss:     Estimated blood loss: none. Impression:               - One 1 mm polyp in the cecum, removed with a cold                            snare. Resected and retrieved.                           - Diverticulosis in the ascending colon and in the                            left colon.                           - Internal hemorrhoids.                            - The examination was otherwise normal on direct                            and retroflexion views. Recommendation:           - Repeat colonoscopy in 5 years for surveillance,                            could be considered based on overall health and                            motivation.                           - Patient has a contact number available for                            emergencies. The signs and symptoms of potential  delayed complications were discussed with the                            patient. Return to normal activities tomorrow.                            Written discharge instructions were provided to the                            patient.                           - Resume previous diet.                           - Continue present medications.                           - Await pathology results. Docia Chuck. Henrene Pastor, MD 08/25/2022 9:48:49 AM This report has been signed electronically.

## 2022-08-25 NOTE — Progress Notes (Signed)
Pt's states no medical or surgical changes since previsit or office visit. 

## 2022-08-25 NOTE — Progress Notes (Signed)
Report to PACU, RN, vss, BBS= Clear.  

## 2022-08-25 NOTE — Progress Notes (Signed)
Called to room to assist during endoscopic procedure.  Patient ID and intended procedure confirmed with present staff. Received instructions for my participation in the procedure from the performing physician.  

## 2022-08-25 NOTE — Progress Notes (Signed)
HISTORY OF PRESENT ILLNESS:  Matthew [name] is a 76 y.o. male with a history of multiple adenomatous colon polyps and malignant colon polyp.  Multiple prior colonoscopies.  Last examination 2020.  Now for follow-up  REVIEW OF SYSTEMS:  All non-GI ROS negative except for  Past Medical History:  Diagnosis Date   Allergic rhinitis, cause unspecified    Allergy    Arthritis    Asthma    Benign neoplasm of colon    Clotting disorder (Matthew [name])    arterial bypass    Collapsed lung    COPD (chronic obstructive pulmonary disease) (HCC)    ED (erectile dysfunction)    GERD (gastroesophageal reflux disease)    Hyperlipidemia    Hypertension    Internal hemorrhoids without mention of complication    Left testicular cancer (Matthew [name]) 07/2014   OSA (obstructive sleep apnea)    wears CPAP   Sleep apnea    occasional CPAP   Thyroid disease    Unspecified asthma(493.90)    Unspecified sinusitis (chronic)     Past Surgical History:  Procedure Laterality Date   COLONOSCOPY     DENTAL SURGERY  12/2014   INGUINAL HERNIA REPAIR     MAXILLARY ANTROSTOMY Left 10/05/2021   Procedure: MAXILLARY ANTROSTOMY WITH TISSUE REMOVAL;  Surgeon: Matthew Canterbury, MD;  Location: [facility];  Service: ENT;  Laterality: Left;  no disk needed   NOSE SURGERY     submucous surgery   POLYPECTOMY     SINUS SURGERY WITH INSTATRAK     SUBCLAVIAN BYPASS GRAFT  2009   TESTICLE REMOVAL      Social History [employer]  reports that he has never smoked. He has never used smokeless tobacco. He reports current alcohol use of about 7.0 - 10.0 standard drinks of alcohol per week. He reports that he does not use drugs.  family history includes Allergies in his father; Arthritis in his sister; Asthma in his sister; Breast cancer in his sister; CAD in his brother and father; Colon cancer (age of onset: 29) in his sister; Colon polyps in his sister; Congestive Heart Failure in his mother; Emphysema in his mother; Heart  attack in his father, sister, and sister; Lung cancer in his brother; Stroke in his brother.  No Known Allergies     PHYSICAL EXAMINATION: Vital signs: BP (!) 142/83   Pulse 70   Temp 98.9 F (37.2 C)   Ht 6' (1.829 m)   Wt 256 lb (116.1 kg)   SpO2 96%   BMI 34.72 kg/m  General: Well-developed, well-nourished, no acute distress HEENT: Sclerae are anicteric, conjunctiva pink. Oral mucosa intact Lungs: Clear Heart: Regular Abdomen: soft, nontender, nondistended, no obvious ascites, no peritoneal signs, normal bowel sounds. No organomegaly. Extremities: No edema Psychiatric: alert and oriented x3. Cooperative      ASSESSMENT:  Personal history of multiple adenomatous colon polyps and malignant colon polyp   PLAN: Surveillance colonoscopy

## 2022-08-25 NOTE — Patient Instructions (Signed)
Await pathology results.  Handout on polyps, diverticulosis, and hemorrhoids provided.  YOU HAD AN ENDOSCOPIC PROCEDURE TODAY AT [facility]:   Refer to the procedure report that was given to you for any specific questions about what was found during the examination.  If the procedure report does not answer your questions, please call your gastroenterologist to clarify.  If you requested that your care partner not be given the details of your procedure findings, then the procedure report has been included in a sealed envelope for you to review at your convenience later.  YOU SHOULD EXPECT: Some feelings of bloating in the abdomen. Passage of more gas than usual.  Walking can help get rid of the air that was put into your GI tract during the procedure and reduce the bloating. If you had a lower endoscopy (such as a colonoscopy or flexible sigmoidoscopy) you may notice spotting of blood in your stool or on the toilet paper. If you underwent a bowel prep for your procedure, you may not have a normal bowel movement for a few days.  Please Note:  You might notice some irritation and congestion in your nose or some drainage.  This is from the oxygen used during your procedure.  There is no need for concern and it should clear up in a day or so.  SYMPTOMS TO REPORT IMMEDIATELY:  Following lower endoscopy (colonoscopy or flexible sigmoidoscopy):  Excessive amounts of blood in the stool  Significant tenderness or worsening of abdominal pains  Swelling of the abdomen that is new, acute  Fever of 100F or higher  For urgent or emergent issues, a gastroenterologist can be reached at any hour by calling [phone]. Do not use MyChart messaging for urgent concerns.    DIET:  We do recommend a small meal at first, but then you may proceed to your regular diet.  Drink plenty of fluids but you should avoid alcoholic beverages for 24 hours.  ACTIVITY:  You should plan to take it easy for  the rest of today and you should NOT DRIVE or use heavy machinery until tomorrow (because of the sedation medicines used during the test).    FOLLOW UP: Our staff will call the number listed on your records the next business day following your procedure.  We will call around 7:15- 8:00 am to check on you and address any questions or concerns that you may have regarding the information given to you following your procedure. If we do not reach you, we will leave a message.     If any biopsies were taken you will be contacted by phone or by letter within the next 1-3 weeks.  Please call us at [phone] if you have not heard about the biopsies in 3 weeks.    SIGNATURES/CONFIDENTIALITY: You and/or your care partner have signed paperwork which will be entered into your electronic medical record.  These signatures attest to the fact that that the information above on your After Visit Summary has been reviewed and is understood.  Full responsibility of the confidentiality of this discharge information lies with you and/or your care-partner.

## 2022-08-26 ENCOUNTER — Telehealth: Payer: Self-pay | Admitting: *Deleted

## 2022-08-26 NOTE — Telephone Encounter (Signed)
  Follow up Call-     08/25/2022    8:28 AM  Call back number  Post procedure Call Back phone  # [phone]  Permission to leave phone message Yes     Patient questions:  Do you have a fever, pain , or abdominal swelling? No. Pain Score  0 *  Have you tolerated food without any problems? Yes.    Have you been able to return to your normal activities? Yes.    Do you have any questions about your discharge instructions: Diet   No. Medications  No. Follow up visit  No.  Do you have questions or concerns about your Care? No.  Actions: * If pain score is 4 or above: No action needed, pain <4.

## 2022-08-29 ENCOUNTER — Encounter: Payer: Self-pay | Admitting: Internal Medicine

## 2022-11-08 NOTE — Progress Notes (Unsigned)
I,[name],acting as a scribe for [employer], MD.,have documented all relevant documentation on the behalf of [name], MD,as directed by  [name], MD while in the presence of [name], MD.   Established patient visit   Patient: Matthew [name]   DOB: Apr 18, 1946   76 y.o. Male  MRN: [id number] Visit Date: 11/09/2022  Today's healthcare provider: [name], MD   No chief complaint on file.  Subjective    HPI  Lipid/Cholesterol, Follow-up  Last lipid panel Other pertinent labs  Lab Results  Component Value Date   CHOL 147 07/28/2022   HDL 42 07/28/2022   LDLCALC 53 07/28/2022   TRIG 345 (H) 07/28/2022   CHOLHDL 3.0 06/22/2021   Lab Results  Component Value Date   ALT 27 07/28/2022   AST 29 07/28/2022   PLT 240 06/22/2021   TSH 3.180 07/28/2022     He was last seen for this 3 months ago.  Management since that visit includes continue statin (atorvastatin '40mg'$ ) and dietary management  He reports that he gets out for physical activity when the weather is nice so he has not been very active lately  He also reports making some changes to his diet to try and lower cholesterol levels    He reports excellent compliance with treatment.  Symptoms: No chest pain No chest pressure/discomfort  No dyspnea No lower extremity edema  No numbness or tingling of extremity No orthopnea  No palpitations No paroxysmal nocturnal dyspnea  No speech difficulty No syncope    The 10-year ASCVD risk score (Arnett DK, et al., 2019) is: 26.7%  ---------------------------------------------------------------------------------------------------   Medications: Outpatient Medications Prior to Visit  Medication Sig   acetaminophen (TYLENOL) 500 MG tablet Take 500 mg by mouth every 6 (six) hours as needed.   albuterol (VENTOLIN HFA) 108 (90 Base) MCG/ACT inhaler TAKE 2 PUFFS BY MOUTH EVERY 6 HOURS AS NEEDED  FOR WHEEZE OR SHORTNESS OF BREATH   Ascorbic Acid (VITAMIN C) 1000 MG tablet Take 1,000 mg by mouth daily.   aspirin EC 81 MG tablet Take 81 mg by mouth daily. Swallow whole.   atorvastatin (LIPITOR) 40 MG tablet Patient is taking '40mg'$    azelastine (ASTELIN) 0.1 % nasal spray Place into both nostrils 2 (two) times daily. Use in each nostril as directed   budesonide-formoterol (SYMBICORT) 160-4.5 MCG/ACT inhaler INHALE 1-2 PUFFS TWICE DAILY   fluticasone (FLONASE) 50 MCG/ACT nasal spray Place 2 sprays into both nostrils daily.   folic acid (FOLVITE) 659 MCG tablet Take 400 mcg by mouth daily.   levalbuterol (XOPENEX HFA) 45 MCG/ACT inhaler Inhale 1-2 puffs into the lungs every 6 (six) hours as needed for wheezing.   levothyroxine (SYNTHROID) 50 MCG tablet TAKE 1 TABLET BY MOUTH EVERY DAY   losartan (COZAAR) 50 MG tablet Take 50 mg by mouth daily. Patient reports taking half of one. (25)   Multiple Vitamins-Minerals (CENTRUM SILVER ADULT 50+ PO) Take 1 tablet by mouth daily.   Potassium 75 MG TABS Take 99 mg by mouth daily.   Testosterone 25 MG/2.5GM (1%) GEL Apply topically.   vitamin E 180 MG (400 UNITS) capsule Take 400 Units by mouth daily.   [DISCONTINUED] meloxicam (MOBIC) 15 MG tablet Take 15 mg by mouth daily.   No facility-administered medications prior to visit.    Review of Systems     Objective    BP 121/63 (BP Location: Right Arm, Patient Position: Sitting, Cuff Size: Large)  Pulse 70   Temp 97.8 F (36.6 C) (Oral)   Resp 16   Wt 256 lb (116.1 kg)   BMI 34.72 kg/m    Physical Exam Vitals reviewed.  Constitutional:      General: He is not in acute distress.    Appearance: Normal appearance. He is not ill-appearing, toxic-appearing or diaphoretic.  Eyes:     Conjunctiva/sclera: Conjunctivae normal.  Cardiovascular:     Rate and Rhythm: Normal rate and regular rhythm.     Pulses: Normal pulses.     Heart sounds: Normal heart sounds. No murmur heard.    No  friction rub. No gallop.  Pulmonary:     Effort: Pulmonary effort is normal. No respiratory distress.     Breath sounds: Normal breath sounds. No stridor. No wheezing, rhonchi or rales.  Abdominal:     General: Bowel sounds are normal. There is no distension.     Palpations: Abdomen is soft.     Tenderness: There is no abdominal tenderness.  Musculoskeletal:     Right lower leg: No edema.     Left lower leg: No edema.  Skin:    Findings: No erythema or rash.  Neurological:     Mental Status: He is alert and oriented to person, place, and time.      No results found for any visits on 11/09/22.  Assessment & Plan     Problem List Items Addressed This Visit       Other   Elevated triglycerides with high cholesterol - Primary    Elevated TG  Will recheck lipid panel when patient is fasting in next 1-2 days  Discussed adding zetia to help with lowering TG levels if levels remain elevated  He will continue atorvastatin daily       Relevant Orders   Lipid Profile   COVID-19 vaccine administered    COVID vaccine was administered today  Patient tolerated well        Relevant Orders   [employer] Fall 2023 Covid-19 Vaccine 6yr and older (Completed)     Return in about 3 months (around 02/08/2023) for cholesterol .      I, MEulis Foster MD, have reviewed all documentation for this visit.  Portions of this information were initially documented by the CMA and reviewed by me for thoroughness and accuracy.      MEulis Foster MD  [phone](phone) [phone](fax)  [place]

## 2022-11-09 ENCOUNTER — Ambulatory Visit (INDEPENDENT_AMBULATORY_CARE_PROVIDER_SITE_OTHER): Payer: Medicare Other | Admitting: Family Medicine

## 2022-11-09 ENCOUNTER — Encounter: Payer: Self-pay | Admitting: Family Medicine

## 2022-11-09 VITALS — BP 121/63 | HR 70 | Temp 97.8°F | Resp 16 | Wt 256.0 lb

## 2022-11-09 DIAGNOSIS — Z23 Encounter for immunization: Secondary | ICD-10-CM | POA: Insufficient documentation

## 2022-11-09 DIAGNOSIS — E782 Mixed hyperlipidemia: Secondary | ICD-10-CM | POA: Diagnosis not present

## 2022-11-09 NOTE — Patient Instructions (Addendum)
Please return between 8am and 11:30 AM this week to get fasting lab work to check cholesterol levels.

## 2022-11-09 NOTE — Assessment & Plan Note (Addendum)
Elevated TG  Will recheck lipid panel when patient is fasting in next 1-2 days  Discussed adding zetia to help with lowering TG levels if levels remain elevated  He will continue atorvastatin daily

## 2022-11-09 NOTE — Assessment & Plan Note (Signed)
COVID vaccine was administered today  Patient tolerated well

## 2022-11-10 DIAGNOSIS — E782 Mixed hyperlipidemia: Secondary | ICD-10-CM | POA: Diagnosis not present

## 2022-11-11 LAB — LIPID PANEL
Chol/HDL Ratio: 3.2 ratio (ref 0.0–5.0)
Cholesterol, Total: 143 mg/dL (ref 100–199)
HDL: 45 mg/dL (ref >39)
LDL Chol Calc (NIH): 77 mg/dL (ref 0–99)
Triglycerides: 115 mg/dL (ref 0–149)
VLDL Cholesterol Cal: 21 mg/dL (ref 5–40)

## 2022-11-11 LAB — SPECIMEN STATUS REPORT

## 2022-12-16 ENCOUNTER — Telehealth: Payer: Self-pay | Admitting: Podiatry

## 2022-12-16 NOTE — Telephone Encounter (Signed)
Patient called this morning he wants to order the exact same pair of orthotics that he had made in 08/2021 . Patient is aware of cost and that insurance may not cover them, he still wants to order them.

## 2023-01-19 DIAGNOSIS — I10 Essential (primary) hypertension: Secondary | ICD-10-CM | POA: Diagnosis not present

## 2023-01-19 DIAGNOSIS — I2510 Atherosclerotic heart disease of native coronary artery without angina pectoris: Secondary | ICD-10-CM | POA: Diagnosis not present

## 2023-01-19 DIAGNOSIS — J9811 Atelectasis: Secondary | ICD-10-CM | POA: Diagnosis not present

## 2023-01-19 DIAGNOSIS — R739 Hyperglycemia, unspecified: Secondary | ICD-10-CM | POA: Diagnosis not present

## 2023-01-19 DIAGNOSIS — R600 Localized edema: Secondary | ICD-10-CM | POA: Diagnosis not present

## 2023-01-19 DIAGNOSIS — J4521 Mild intermittent asthma with (acute) exacerbation: Secondary | ICD-10-CM | POA: Diagnosis not present

## 2023-01-19 DIAGNOSIS — E7800 Pure hypercholesterolemia, unspecified: Secondary | ICD-10-CM | POA: Diagnosis not present

## 2023-01-19 DIAGNOSIS — J410 Simple chronic bronchitis: Secondary | ICD-10-CM | POA: Diagnosis not present

## 2023-01-19 DIAGNOSIS — E039 Hypothyroidism, unspecified: Secondary | ICD-10-CM | POA: Diagnosis not present

## 2023-01-19 DIAGNOSIS — G4733 Obstructive sleep apnea (adult) (pediatric): Secondary | ICD-10-CM | POA: Diagnosis not present

## 2023-01-30 DIAGNOSIS — G4733 Obstructive sleep apnea (adult) (pediatric): Secondary | ICD-10-CM | POA: Diagnosis not present

## 2023-01-30 DIAGNOSIS — J4520 Mild intermittent asthma, uncomplicated: Secondary | ICD-10-CM | POA: Diagnosis not present

## 2023-01-30 DIAGNOSIS — J301 Allergic rhinitis due to pollen: Secondary | ICD-10-CM | POA: Diagnosis not present

## 2023-01-30 DIAGNOSIS — R1310 Dysphagia, unspecified: Secondary | ICD-10-CM | POA: Diagnosis not present

## 2023-01-30 DIAGNOSIS — R053 Chronic cough: Secondary | ICD-10-CM | POA: Diagnosis not present

## 2023-01-31 ENCOUNTER — Other Ambulatory Visit: Payer: Self-pay | Admitting: Specialist

## 2023-01-31 DIAGNOSIS — R1310 Dysphagia, unspecified: Secondary | ICD-10-CM

## 2023-02-08 NOTE — Telephone Encounter (Signed)
Patient called checking on his order for 2nd pair of orthotics?  Spoke with Angie at [facility] they never received a request for 2nd pair , how do you want to proceed?

## 2023-02-10 DIAGNOSIS — G4733 Obstructive sleep apnea (adult) (pediatric): Secondary | ICD-10-CM | POA: Diagnosis not present

## 2023-02-14 ENCOUNTER — Ambulatory Visit
Admission: RE | Admit: 2023-02-14 | Discharge: 2023-02-14 | Disposition: A | Discharge status: Home / Self Care | Payer: Medicare Other | Source: Ambulatory Visit | Attending: Specialist | Admitting: Specialist

## 2023-02-14 DIAGNOSIS — K219 Gastro-esophageal reflux disease without esophagitis: Secondary | ICD-10-CM | POA: Diagnosis not present

## 2023-02-14 DIAGNOSIS — R1310 Dysphagia, unspecified: Secondary | ICD-10-CM | POA: Diagnosis not present

## 2023-02-14 NOTE — Progress Notes (Addendum)
Modified Barium Swallow Study  Patient Details  Name: Matthew [name] MRN: [id number] Date of Birth: 1946/03/31  Today's Date: 02/14/2023  Modified Barium Swallow completed.  Full report located under Chart Review in the Imaging Section.  History of Present Illness Pt is a 77 yo male w/ PMH including reactive airway disease - mild, intermittent with acute exacerbation;    simple chronic bronchitis; Chronic cough; coronary artery disease involving native coronary artery of native heart without angina pectoris; essential (primary) hypertension;   OSA (obstructive sleep apnea); mild, persistent asthma without complication; pure hypercholesterolemia;   adult hypothyroidism;   lower extremity edema.  Noted pt had tremors of the Bilat. UEs which have not been mentioned in recent PCP Office visit note(appeared) - pt stated he has had the tremors for "a long time".    Pt denied any dx of GERD/REFLUX but Endorsed multiple s/s of such. He declined taking medications for such.    Pt has had only 3 CXRs in current chart since 2020 revealing "no acute disease" and most recent in 2022: "Mild cardiomegaly and pulmonary vascular congestion".   No reported weight loss.    Clinical Impression Patient presents with functional oropharyngeal swallowing for age. No laryngeal penetration nor aspiration noted during this study.    Oral stage is characterized by appropriate lip closure, bolus preparation and adequate containment w/ timely anterior to posterior transit. Swallow initiation occurs at the level of the posterior laryngeal surface of the Epiglottis; just spilling from the Valleculae w/ both thin and Nectar consistency liquids.    Pharyngeal stage is noted for functional tongue base retraction, adequate hyolaryngeal excursion, and adequate pharyngeal constriction. Epiglottic deflection is complete; there was No penetration nor aspiration during this study. There was mild valleculae residue x1 which pt  cleared independently w/ a f/u swallow. Pharyngeal stripping wave is complete.  Amplitude/duration of cricopharyngeus opening is WFL. There is adequate/complete clearance through the cervical esophagus. An esophageal sweep w/ solid bolus(coated in puree) was performed in the upright, standing position(A-P) which was unremarkable.   No cough during/post oral intake noted and No c/o phlegm post oral intake during this evaluation today (both of these are issues he reports post meals at home).  Consistencies tested were thin liquids x2 tsps, 1 cup sip, 3 then 3 sequential sips, nectar x1 tsp, 2 cup sips, honey x1 tsp, pudding x1 tsp, regular solid x2 (1/2 graham cracker with pudding).     Recommend patient continue regular diet with thin liquids; educated pt verbally on general aspiration and REFLUX precautions including cutting foods/meats small, moistened all foods, alternating solids and liquids.  Recommend f/u w/ GI for consultation re: potential REFLUX management.  No further ST indicated. Factors that may increase risk of adverse event in presence of aspiration Rubye Oaks & Clearance Coots 2021):  (none)  Swallow Evaluation Recommendations Recommendations: PO diet PO Diet Recommendation: Regular;Thin liquids (Level 0) (moistened foods cut small) Liquid Administration via: Cup Medication Administration: Whole meds with liquid Supervision: Patient able to self-feed Swallowing strategies  : Minimize environmental distractions;Slow rate;Small bites/sips;Follow solids with liquids Postural changes: Position pt fully upright for meals;Stay upright 30-60 min after meals (REFLUX Precautions) Oral care recommendations: Oral care BID (2x/day);Pt independent with oral care Recommended consults: Consider GI consultation;Consider esophageal assessment (in setting of c/o overt clinical s/s of REFLUX activity: couhg post meals, Phlegm post meals, belch/hiccup)        Jerilynn Som, MS, CCC-SLP Speech  Language Pathologist Rehab Services; [facility] -  [phone] (ascom)  Tatisha Cerino 02/14/2023,5:52 PM

## 2023-03-02 ENCOUNTER — Other Ambulatory Visit: Payer: Medicare Other

## 2023-03-13 ENCOUNTER — Ambulatory Visit: Payer: Medicare Other

## 2023-03-13 DIAGNOSIS — M778 Other enthesopathies, not elsewhere classified: Secondary | ICD-10-CM

## 2023-03-13 NOTE — Progress Notes (Signed)
Patient presents today to be casted for custom molded orthotics. HYATT is the treating physician.  Impression foam cast was taken. ABN signed.  Patient info-  Shoe size: 11.5  Height: 6FT 1IN  Weight: 256  Insurance: [employer]   Patient will be notified once orthotics arrive in office and reappoint for fitting at that time.

## 2023-04-12 ENCOUNTER — Ambulatory Visit: Payer: Self-pay

## 2023-04-12 NOTE — Patient Outreach (Signed)
  Care Coordination   04/12/2023 Name: BRYAN GOIN MRN: [id number] DOB: 1946/04/25   Care Coordination Outreach Attempts:  An unsuccessful telephone outreach was attempted today to offer the patient information about available care coordination services.  Follow Up Plan:  Additional outreach attempts will be made to offer the patient care coordination information and services.   Encounter Outcome:  No Answer   Care Coordination Interventions:  No, not indicated    SIG Lysle Morales, BSW Social Worker [facility] Care Management  [phone]

## 2023-04-25 DIAGNOSIS — I2510 Atherosclerotic heart disease of native coronary artery without angina pectoris: Secondary | ICD-10-CM | POA: Diagnosis not present

## 2023-04-25 DIAGNOSIS — E782 Mixed hyperlipidemia: Secondary | ICD-10-CM | POA: Diagnosis not present

## 2023-04-25 DIAGNOSIS — I10 Essential (primary) hypertension: Secondary | ICD-10-CM | POA: Diagnosis not present

## 2023-04-25 DIAGNOSIS — G4733 Obstructive sleep apnea (adult) (pediatric): Secondary | ICD-10-CM | POA: Diagnosis not present

## 2023-04-25 DIAGNOSIS — I38 Endocarditis, valve unspecified: Secondary | ICD-10-CM | POA: Diagnosis not present

## 2023-04-28 ENCOUNTER — Telehealth: Payer: Self-pay | Admitting: Podiatry

## 2023-04-28 NOTE — Telephone Encounter (Signed)
LVM to pick up orthotics  

## 2023-05-03 ENCOUNTER — Ambulatory Visit: Payer: Self-pay

## 2023-05-03 NOTE — Patient Outreach (Signed)
  Care Coordination   05/03/2023 Name: Matthew [name] MRN: [id number] DOB: [birth year]   Care Coordination Outreach Attempts:  A second unsuccessful outreach was attempted today to offer the patient with information about available care coordination services.  Follow Up Plan:  Additional outreach attempts will be made to offer the patient care coordination information and services.   Encounter Outcome:  No Answer   Care Coordination Interventions:  No, not indicated    SIG Lysle Morales, BSW Social Worker [facility] Care Management  [phone]

## 2023-05-12 DIAGNOSIS — G4733 Obstructive sleep apnea (adult) (pediatric): Secondary | ICD-10-CM | POA: Diagnosis not present

## 2023-05-19 ENCOUNTER — Ambulatory Visit: Payer: Medicare Other | Admitting: *Deleted

## 2023-05-19 DIAGNOSIS — M2041 Other hammer toe(s) (acquired), right foot: Secondary | ICD-10-CM | POA: Diagnosis not present

## 2023-05-19 DIAGNOSIS — S99921A Unspecified injury of right foot, initial encounter: Secondary | ICD-10-CM

## 2023-05-19 DIAGNOSIS — M778 Other enthesopathies, not elsewhere classified: Secondary | ICD-10-CM

## 2023-05-19 DIAGNOSIS — S99921D Unspecified injury of right foot, subsequent encounter: Secondary | ICD-10-CM

## 2023-05-19 NOTE — Progress Notes (Signed)
Patient presents today to pick up custom orthotics   Patient was dispensed 1 pair of custom orthoticss. Fit was satisfactory. Instructions for break-in and wear was reviewed and a copy was given to the patient.

## 2023-06-07 DIAGNOSIS — B356 Tinea cruris: Secondary | ICD-10-CM | POA: Diagnosis not present

## 2023-08-03 DIAGNOSIS — J4530 Mild persistent asthma, uncomplicated: Secondary | ICD-10-CM | POA: Diagnosis not present

## 2023-08-03 DIAGNOSIS — J411 Mucopurulent chronic bronchitis: Secondary | ICD-10-CM | POA: Diagnosis not present

## 2023-08-03 DIAGNOSIS — E039 Hypothyroidism, unspecified: Secondary | ICD-10-CM | POA: Diagnosis not present

## 2023-08-03 DIAGNOSIS — E7800 Pure hypercholesterolemia, unspecified: Secondary | ICD-10-CM | POA: Diagnosis not present

## 2023-08-03 DIAGNOSIS — Z23 Encounter for immunization: Secondary | ICD-10-CM | POA: Diagnosis not present

## 2023-08-03 DIAGNOSIS — I2510 Atherosclerotic heart disease of native coronary artery without angina pectoris: Secondary | ICD-10-CM | POA: Diagnosis not present

## 2023-08-03 DIAGNOSIS — Z0000 Encounter for general adult medical examination without abnormal findings: Secondary | ICD-10-CM | POA: Diagnosis not present

## 2023-08-03 DIAGNOSIS — J4520 Mild intermittent asthma, uncomplicated: Secondary | ICD-10-CM | POA: Diagnosis not present

## 2023-08-03 DIAGNOSIS — I10 Essential (primary) hypertension: Secondary | ICD-10-CM | POA: Diagnosis not present

## 2023-08-03 DIAGNOSIS — R739 Hyperglycemia, unspecified: Secondary | ICD-10-CM | POA: Diagnosis not present

## 2023-08-03 DIAGNOSIS — G4733 Obstructive sleep apnea (adult) (pediatric): Secondary | ICD-10-CM | POA: Diagnosis not present

## 2024-03-11 ENCOUNTER — Other Ambulatory Visit: Payer: Self-pay | Admitting: Specialist

## 2024-03-11 DIAGNOSIS — R053 Chronic cough: Secondary | ICD-10-CM

## 2024-03-11 DIAGNOSIS — J4530 Mild persistent asthma, uncomplicated: Secondary | ICD-10-CM

## 2024-03-21 ENCOUNTER — Ambulatory Visit
Admission: RE | Admit: 2024-03-21 | Discharge: 2024-03-21 | Disposition: A | Discharge status: Home / Self Care | Source: Ambulatory Visit | Attending: Specialist | Admitting: Specialist

## 2024-03-21 DIAGNOSIS — R1310 Dysphagia, unspecified: Secondary | ICD-10-CM | POA: Insufficient documentation

## 2024-03-21 DIAGNOSIS — J4530 Mild persistent asthma, uncomplicated: Secondary | ICD-10-CM | POA: Insufficient documentation

## 2024-03-21 DIAGNOSIS — R053 Chronic cough: Secondary | ICD-10-CM | POA: Insufficient documentation

## 2024-03-21 NOTE — Therapy (Signed)
 Modified Barium Swallow Study  Patient Details  Name: Matthew [name] MRN: [id number] Date of Birth: 1946/06/27  Today's Date: 03/21/2024  Modified Barium Swallow completed.  Full report located under Chart Review in the Imaging Section.  History of Present Illness Pt is a 78 yo male w/ PMH including reactive airway disease - mild, intermittent with acute exacerbation;    simple chronic bronchitis; Chronic cough; coronary artery disease involving native coronary artery of native heart without angina pectoris; essential (primary) hypertension;   OSA (obstructive sleep apnea); mild, persistent asthma without complication; pure hypercholesterolemia;   adult hypothyroidism;   lower extremity edema; GERD. Pt endorsed globus sensation and coughing after POs. He declined taking medications GERD. No reported weight loss. Recent MBSS in 01/2023 was unremarkable. CXR 03/05/24: No acute osseus abnormality.  Unchanged asymmetric elevation of the right hemidiaphragm.  Clear lungs.  Stable appearance of the heart and mediastinum.   Clinical Impression No appreciable change in swallow function compared to MBSS in Apri 2024. Pt demonstrated intact oropharyngeal swallow function. Given pt's complaints (e.g. intermittent globus with solids, coughing after POs), consider further esophageal assessment and/or management of GERD. Recommend continuation of a regular diet with thin liquids with standard aspiration and reflux precautions. No further SLP services warranted. Factors that may increase risk of adverse event in presence of aspiration Matthew [name] & Matthew [name] 2021):    Swallow Evaluation Recommendations Recommendations: PO diet PO Diet Recommendation: Regular;Thin liquids (Level 0) Liquid Administration via: Spoon;Cup;Straw Medication Administration:  (as tolerated) Supervision: Patient able to self-feed Swallowing strategies  : Small bites/sips;Slow rate;Follow solids with liquids Postural changes: Position pt  fully upright for meals;Out of bed for meals (upright 60-90 minutes after POs) Oral care recommendations: Oral care BID (2x/day);Pt independent with oral care Recommended consults: Consider GI consultation;Consider esophageal assessment   Matthew [name], M.S., CCC-SLP Speech-Language Pathologist [facility] [phone] (ASCOM)    Matthew [name] 03/21/2024,1:21 PM
